# Patient Record
Sex: Female | Born: 1985 | Race: Black or African American | Hispanic: No | Marital: Single | Smoking: Former smoker
Health system: Southern US, Community
[De-identification: ages and names within clinical notes are randomized; demographics above are authoritative.]

## PROBLEM LIST (undated history)

## (undated) DIAGNOSIS — D649 Anemia, unspecified: Secondary | ICD-10-CM

## (undated) DIAGNOSIS — E079 Disorder of thyroid, unspecified: Secondary | ICD-10-CM

## (undated) DIAGNOSIS — O139 Gestational [pregnancy-induced] hypertension without significant proteinuria, unspecified trimester: Secondary | ICD-10-CM

## (undated) DIAGNOSIS — R1110 Vomiting, unspecified: Secondary | ICD-10-CM

## (undated) DIAGNOSIS — E0500 Thyrotoxicosis with diffuse goiter without thyrotoxic crisis or storm: Secondary | ICD-10-CM

## (undated) DIAGNOSIS — F319 Bipolar disorder, unspecified: Secondary | ICD-10-CM

## (undated) HISTORY — DX: Vomiting, unspecified: R11.10

## (undated) HISTORY — DX: Anemia, unspecified: D64.9

## (undated) HISTORY — DX: Gestational (pregnancy-induced) hypertension without significant proteinuria, unspecified trimester: O13.9

---
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
## 2006-01-26 ENCOUNTER — Ambulatory Visit: Payer: Self-pay | Admitting: Family Medicine

## 2006-02-21 ENCOUNTER — Ambulatory Visit: Payer: Self-pay | Admitting: Family Medicine

## 2006-02-22 ENCOUNTER — Ambulatory Visit: Payer: Self-pay | Admitting: Family Medicine

## 2006-03-01 ENCOUNTER — Ambulatory Visit: Payer: Self-pay | Admitting: Family Medicine

## 2006-03-02 ENCOUNTER — Ambulatory Visit: Payer: Self-pay | Admitting: Family Medicine

## 2006-10-08 ENCOUNTER — Emergency Department (HOSPITAL_COMMUNITY): Admission: EM | Admit: 2006-10-08 | Discharge: 2006-10-08 | Payer: Self-pay | Admitting: Emergency Medicine

## 2006-11-12 ENCOUNTER — Emergency Department (HOSPITAL_COMMUNITY): Admission: EM | Admit: 2006-11-12 | Discharge: 2006-11-12 | Payer: Self-pay | Admitting: Emergency Medicine

## 2006-12-10 ENCOUNTER — Emergency Department (HOSPITAL_COMMUNITY): Admission: EM | Admit: 2006-12-10 | Discharge: 2006-12-10 | Payer: Self-pay | Admitting: Emergency Medicine

## 2006-12-16 ENCOUNTER — Inpatient Hospital Stay (HOSPITAL_COMMUNITY): Admission: AD | Admit: 2006-12-16 | Discharge: 2006-12-16 | Payer: Self-pay | Admitting: Gynecology

## 2006-12-22 ENCOUNTER — Ambulatory Visit (HOSPITAL_COMMUNITY): Admission: RE | Admit: 2006-12-22 | Discharge: 2006-12-22 | Payer: Self-pay | Admitting: Obstetrics and Gynecology

## 2006-12-22 ENCOUNTER — Encounter: Payer: Self-pay | Admitting: Obstetrics and Gynecology

## 2007-04-30 ENCOUNTER — Inpatient Hospital Stay (HOSPITAL_COMMUNITY): Admission: AD | Admit: 2007-04-30 | Discharge: 2007-04-30 | Payer: Self-pay | Admitting: Obstetrics and Gynecology

## 2007-09-24 ENCOUNTER — Emergency Department (HOSPITAL_COMMUNITY): Admission: EM | Admit: 2007-09-24 | Discharge: 2007-09-24 | Payer: Self-pay | Admitting: Emergency Medicine

## 2007-10-17 ENCOUNTER — Observation Stay (HOSPITAL_COMMUNITY): Admission: AD | Admit: 2007-10-17 | Discharge: 2007-10-19 | Payer: Self-pay | Admitting: Obstetrics and Gynecology

## 2007-10-23 ENCOUNTER — Inpatient Hospital Stay (HOSPITAL_COMMUNITY): Admission: AD | Admit: 2007-10-23 | Discharge: 2007-10-23 | Payer: Self-pay | Admitting: Obstetrics and Gynecology

## 2007-10-26 ENCOUNTER — Inpatient Hospital Stay (HOSPITAL_COMMUNITY): Admission: AD | Admit: 2007-10-26 | Discharge: 2007-10-26 | Payer: Self-pay | Admitting: Obstetrics and Gynecology

## 2007-11-04 ENCOUNTER — Inpatient Hospital Stay (HOSPITAL_COMMUNITY): Admission: AD | Admit: 2007-11-04 | Discharge: 2007-11-04 | Payer: Self-pay | Admitting: Obstetrics and Gynecology

## 2007-11-06 ENCOUNTER — Inpatient Hospital Stay (HOSPITAL_COMMUNITY): Admission: AD | Admit: 2007-11-06 | Discharge: 2007-11-06 | Payer: Self-pay | Admitting: Obstetrics and Gynecology

## 2007-11-10 ENCOUNTER — Inpatient Hospital Stay (HOSPITAL_COMMUNITY): Admission: AD | Admit: 2007-11-10 | Discharge: 2007-11-13 | Payer: Self-pay | Admitting: Obstetrics and Gynecology

## 2008-04-02 ENCOUNTER — Inpatient Hospital Stay (HOSPITAL_COMMUNITY): Admission: AD | Admit: 2008-04-02 | Discharge: 2008-04-02 | Payer: Self-pay | Admitting: Obstetrics & Gynecology

## 2008-04-19 DIAGNOSIS — O139 Gestational [pregnancy-induced] hypertension without significant proteinuria, unspecified trimester: Secondary | ICD-10-CM

## 2008-04-19 HISTORY — DX: Gestational (pregnancy-induced) hypertension without significant proteinuria, unspecified trimester: O13.9

## 2008-04-19 HISTORY — PX: DILATION AND CURETTAGE OF UTERUS: SHX78

## 2009-02-27 ENCOUNTER — Inpatient Hospital Stay (HOSPITAL_COMMUNITY): Admission: AD | Admit: 2009-02-27 | Discharge: 2009-02-28 | Payer: Self-pay | Admitting: Family Medicine

## 2009-06-22 ENCOUNTER — Inpatient Hospital Stay (HOSPITAL_COMMUNITY): Admission: AD | Admit: 2009-06-22 | Discharge: 2009-06-22 | Payer: Self-pay | Admitting: Obstetrics & Gynecology

## 2009-07-21 ENCOUNTER — Ambulatory Visit (HOSPITAL_COMMUNITY): Admission: RE | Admit: 2009-07-21 | Discharge: 2009-07-21 | Payer: Self-pay | Admitting: Obstetrics & Gynecology

## 2009-08-22 ENCOUNTER — Ambulatory Visit (HOSPITAL_COMMUNITY): Admission: RE | Admit: 2009-08-22 | Discharge: 2009-08-22 | Payer: Self-pay | Admitting: Obstetrics & Gynecology

## 2009-08-28 ENCOUNTER — Ambulatory Visit (HOSPITAL_COMMUNITY): Admission: RE | Admit: 2009-08-28 | Discharge: 2009-08-28 | Payer: Self-pay | Admitting: Obstetrics & Gynecology

## 2009-09-01 ENCOUNTER — Ambulatory Visit (HOSPITAL_COMMUNITY): Admission: RE | Admit: 2009-09-01 | Discharge: 2009-09-01 | Payer: Self-pay | Admitting: Obstetrics & Gynecology

## 2009-09-03 ENCOUNTER — Inpatient Hospital Stay (HOSPITAL_COMMUNITY): Admission: AD | Admit: 2009-09-03 | Discharge: 2009-09-04 | Payer: Self-pay | Admitting: Obstetrics & Gynecology

## 2009-09-03 ENCOUNTER — Ambulatory Visit: Payer: Self-pay | Admitting: Advanced Practice Midwife

## 2009-09-19 ENCOUNTER — Ambulatory Visit (HOSPITAL_COMMUNITY): Admission: RE | Admit: 2009-09-19 | Discharge: 2009-09-19 | Payer: Self-pay | Admitting: Obstetrics & Gynecology

## 2009-09-21 ENCOUNTER — Inpatient Hospital Stay (HOSPITAL_COMMUNITY): Admission: AD | Admit: 2009-09-21 | Discharge: 2009-09-22 | Payer: Self-pay | Admitting: Obstetrics & Gynecology

## 2009-09-22 ENCOUNTER — Inpatient Hospital Stay (HOSPITAL_COMMUNITY): Admission: AD | Admit: 2009-09-22 | Discharge: 2009-09-24 | Payer: Self-pay | Admitting: Obstetrics

## 2009-12-24 ENCOUNTER — Emergency Department (HOSPITAL_COMMUNITY): Admission: EM | Admit: 2009-12-24 | Discharge: 2009-12-24 | Payer: Self-pay | Admitting: Emergency Medicine

## 2010-02-13 ENCOUNTER — Ambulatory Visit (HOSPITAL_COMMUNITY)
Admission: RE | Admit: 2010-02-13 | Discharge: 2010-02-13 | Payer: Self-pay | Source: Home / Self Care | Admitting: Obstetrics & Gynecology

## 2010-04-19 NOTE — L&D Delivery Note (Signed)
Delivery Note At 7:23 PM a viable, healthy female was delivered via Vaginal, Spontaneous Precipitous Delivery (Presentation: ; Occiput Anterior).  APGAR: 9, 9; weight 6 pounds 6 ounces .   Placenta status: delivered, spontaneous and intact .  Cord: 3 vessel  with the following complications: None.    Anesthesia:  Stadol IV  Episiotomy: None Lacerations: None  Est. Blood Loss (mL): 300 mL   Mom to postpartum.  Baby to nursery-stable.  Mat Carne 02/02/2011, 7:39 PM

## 2010-05-10 ENCOUNTER — Encounter: Payer: Self-pay | Admitting: Obstetrics and Gynecology

## 2010-05-10 ENCOUNTER — Encounter: Payer: Self-pay | Admitting: Obstetrics & Gynecology

## 2010-07-01 LAB — PREGNANCY, URINE: Preg Test, Ur: NEGATIVE

## 2010-07-01 LAB — CBC
MCH: 30.8 pg (ref 26.0–34.0)
MCV: 90.4 fL (ref 78.0–100.0)
Platelets: 351 10*3/uL (ref 150–400)
RDW: 14.2 % (ref 11.5–15.5)
WBC: 6.9 10*3/uL (ref 4.0–10.5)

## 2010-07-06 LAB — URINALYSIS, ROUTINE W REFLEX MICROSCOPIC
Bilirubin Urine: NEGATIVE
Ketones, ur: 15 mg/dL — AB
Protein, ur: NEGATIVE mg/dL
Urobilinogen, UA: 0.2 mg/dL (ref 0.0–1.0)

## 2010-07-06 LAB — CBC
Hemoglobin: 9.9 g/dL — ABNORMAL LOW (ref 12.0–15.0)
MCHC: 34.9 g/dL (ref 30.0–36.0)
MCV: 93.6 fL (ref 78.0–100.0)
RBC: 3.03 MIL/uL — ABNORMAL LOW (ref 3.87–5.11)
RDW: 12.8 % (ref 11.5–15.5)

## 2010-07-06 LAB — URINE MICROSCOPIC-ADD ON

## 2010-07-06 LAB — WET PREP, GENITAL: Trich, Wet Prep: NONE SEEN

## 2010-07-06 LAB — RPR: RPR Ser Ql: NONREACTIVE

## 2010-07-06 LAB — URINE CULTURE: Colony Count: 9000

## 2010-07-06 LAB — GC/CHLAMYDIA PROBE AMP, URINE: Chlamydia, Swab/Urine, PCR: NEGATIVE

## 2010-07-22 LAB — URINALYSIS, ROUTINE W REFLEX MICROSCOPIC
Ketones, ur: >80 mg/dL — AB
Nitrite: NEGATIVE
Protein, ur: NEGATIVE mg/dL

## 2010-07-22 LAB — CBC
MCHC: 33.7 g/dL (ref 30.0–36.0)
Platelets: 357 10*3/uL (ref 150–400)
RDW: 12.7 % (ref 11.5–15.5)

## 2010-07-22 LAB — POCT PREGNANCY, URINE: Preg Test, Ur: POSITIVE

## 2010-07-22 LAB — WET PREP, GENITAL

## 2010-08-14 ENCOUNTER — Inpatient Hospital Stay (HOSPITAL_COMMUNITY)
Admission: AD | Admit: 2010-08-14 | Discharge: 2010-08-14 | Disposition: A | Discharge status: Home / Self Care | Payer: Medicaid Other | Source: Ambulatory Visit | Attending: Family Medicine | Admitting: Family Medicine

## 2010-08-14 DIAGNOSIS — O210 Mild hyperemesis gravidarum: Secondary | ICD-10-CM | POA: Insufficient documentation

## 2010-08-14 LAB — WET PREP, GENITAL
Clue Cells Wet Prep HPF POC: NONE SEEN
Yeast Wet Prep HPF POC: NONE SEEN

## 2010-08-14 LAB — URINALYSIS, ROUTINE W REFLEX MICROSCOPIC
Glucose, UA: NEGATIVE mg/dL
Nitrite: NEGATIVE
Protein, ur: NEGATIVE mg/dL
pH: 7.5 (ref 5.0–8.0)

## 2010-08-19 ENCOUNTER — Inpatient Hospital Stay (HOSPITAL_COMMUNITY)
Admission: AD | Admit: 2010-08-19 | Discharge: 2010-08-19 | Disposition: A | Discharge status: Home / Self Care | Payer: Medicaid Other | Source: Ambulatory Visit | Attending: Obstetrics and Gynecology | Admitting: Obstetrics and Gynecology

## 2010-08-19 ENCOUNTER — Inpatient Hospital Stay (HOSPITAL_COMMUNITY): Payer: Medicaid Other

## 2010-08-19 DIAGNOSIS — O209 Hemorrhage in early pregnancy, unspecified: Secondary | ICD-10-CM | POA: Insufficient documentation

## 2010-08-19 LAB — URINE MICROSCOPIC-ADD ON

## 2010-08-19 LAB — URINALYSIS, ROUTINE W REFLEX MICROSCOPIC
Glucose, UA: NEGATIVE mg/dL
Leukocytes, UA: NEGATIVE
Protein, ur: NEGATIVE mg/dL
pH: 6.5 (ref 5.0–8.0)

## 2010-08-19 LAB — WET PREP, GENITAL: Clue Cells Wet Prep HPF POC: NONE SEEN

## 2010-09-01 NOTE — H&P (Signed)
[name], [name] NO.:  [id number]   MEDICAL RECORD NO.:  [id number]          PATIENT TYPE:  OBV   LOCATION:  9121                          [facility]   PHYSICIAN:  Osborn Coho, M.D.   DATE OF BIRTH:  1985/12/06   DATE OF ADMISSION:  10/17/2007  DATE OF DISCHARGE:                              HISTORY & PHYSICAL   This is a 25 year old gravida 3, para 1-0-1-1 at 34-0/7 weeks who  presents from the office with hypertension and swelling for 4 days.  She  denies headache or visual changes.  The pregnancy has been followed by  the Nurse Midwife Service remarkable for,  1. History of rapid labor.  2. Seasonal asthma.   ALLERGIES:  SEAFOOD.   OB HISTORY:  Remarkable for vaginal delivery in 2004 of a female infant  at 60 weeks' gestation weighing 6 pounds 2 ounces, remarkable for 3-hour  labor.  She had a missed AB and D and C in 2008.   MEDICAL HISTORY:  Remarkable for anemia with first baby, childhood  varicella, seasonal asthma.   SURGICAL HISTORY:  Negative except for D and C.   FAMILY HISTORY:  Remarkable for grandparents and mother and father with  hypertension, grandmother and cousin with diabetes, grandmother with  stroke, cousin with lupus, grandmother with cervical cancer, mother with  schizophrenia.   GENETIC HISTORY:  Remarkable for father of the baby's uncle with heart  defects and father of the baby's cousin with spina bifida.   SOCIAL HISTORY:  The patient is single.  Father of baby [name]  is involved and supportive.  She does not report a religious  affiliation.  She denies any alcohol, tobacco or drug use.   PRENATAL LABS:  Hemoglobin 12.8, platelets 337, blood type O+, antibody  screen negative, sickle cell negative, RPR nonreactive, rubella immune,  hepatitis negative, HIV negative, gonorrhea negative, chlamydia  negative, cystic fibrosis negative.   HISTORY OF CURRENT PREGNANCY:  The patient entered care at 85 weeks'  gestation.  She had an ultrasound at 9 weeks and a first trimester  screen at 12 weeks that was normal, 18-week ultrasound was normal, and  glucola was done at 28 weeks and was normal.  She was noted to have some  thyromegaly at 28 weeks, but TSH was normal, and blood pressures  throughout pregnancy has been 100-130 over 60-80.   OBJECTIVE:  VITAL SIGNS: Stable.  Blood pressures 126-135 over 89-97  with most diastolics in the low 90s.  HEENT:  Within normal limits.  THYROID:  Normal, not enlarged by my exam.  CHEST:  Clear to auscultation.  Heart:  Regular rate and rhythm.  ABDOMEN:  Gravid at 34 cm, vertex Leopold's, EFM shows reactive fetal  heart rate with occasional contractions.  PELVIC:  Deferred.  EXTREMITIES:  Showed 3+ DTRs with no clonus and 1+ edema of the feet and  legs.   Cath UA shows negative protein.  CBC shows hemoglobin 12.1, platelets  280, sodium 137, potassium 3.6, creatinine 0.43, AST 13, ALT 10, uric  acid 3.9.   ASSESSMENT:  1. Intrauterine  pregnancy at 34-0/7 weeks.  2. Pregnancy-induced hypertension.   PLAN:  1. Admit to hospital for outpatient observation.  2. A 24-hour urine for protein and creatinine.  3. Bed rest with bathroom privileges.  4. Further orders to follow.      Marie L. Williams, C.N.M.      Osborn Coho, M.D.  Electronically Signed    MLW/MEDQ  D:  10/17/2007  T:  10/18/2007  Job:  [id number]

## 2010-09-01 NOTE — H&P (Signed)
NAMECRYSTALE, [name]              ACCOUNT NO.:  [id number]   MEDICAL RECORD NO.:  [id number]         PATIENT TYPE:  WINP   LOCATION:                                [facility]   PHYSICIAN:  Hal Morales, M.D.DATE OF BIRTH:  [birth year]   DATE OF ADMISSION:  11/10/2007  DATE OF DISCHARGE:                              HISTORY & PHYSICAL   This is a 25 year old, gravida 3, para 1-0-1-1, at 74 and 3/7 weeks who  presents for [facility] evaluation.  She had a nonreactive NST in the office  and blood pressures were elevated in the 150s/100-110 range.  She has  had some headaches off and on over the last few weeks.  She first  developed hypertension at 34 weeks and was placed on labetalol and  dosages have been increased over time to 500 mg t.i.d.  She has not  taken any medications in the past 3 days secondary to reported vomiting.  Pregnancy has been followed initially by the Midwife Service and by the  MD service and remarkable for:  1. History of rapid labor.  2. Asthma.  3. Group B Strep positive.   ALLERGIES:  SEAFOOD.   OB history is remarkable for vaginal delivery in 2004 of a female infant  at [redacted] weeks gestation weighing 6 pounds 2 ounces with no complications.  She had a missed AB in 2008 with a D&C.   Medical history is remarkable for anemia with her first baby, history of  childhood varicella, and history of seasonal asthma.   Surgical history is remarkable for a D&C in 2008.   Family history is remarkable for grandparents and parents and aunts and  uncles with hypertension, grandmother with diabetes and a cousin with  diabetes, grandmother with stroke, cousin with lupus, grandmother with  cervical cancer, and mother with schizophrenia.   Genetic history is remarkable for father of the baby's uncle with heart  defects, and father of baby's cousin with spina bifida.   SOCIAL HISTORY:  The patient is single.  Father of the baby, Hosie Spangle is involved and  supportive.  She does not report a religious  affiliation.  She denies any alcohol, tobacco, or drug use.   PRENATAL LABS:  Hemoglobin 12.8 and platelets 337,000.  Blood type O+.  Antibody screen negative.  Sickle cell negative.  RPR nonreactive.  Rubella immune.  Hepatitis negative.  HIV negative.  Gonorrhea negative.  Chlamydia negative.  Cystic fibrosis negative.  History of current  pregnancy.  The patient entered care at 14 weeks' gestation.  She had a  first trimester screen that was normal.  Ultrasound at 18 weeks was also  normal.  Glucola at 28 weeks was normal.  She was noted to have some  diffuse thyromegaly and labs were done and TSH was found to be normal,  and she developed some hypertension at 34 weeks in the 130/100 range.  She was placed on labetalol 100 mg twice a day.  Her labetalol was  increased at 35 weeks and then again at 36 weeks to 300 mg twice 3 times  a  day and she reports that she was increased to 500 mg just recently.   OBJECTIVE DATA:  VITAL SIGNS:  Stable.  Blood pressures initially 145-  163 over 96-111.  She was given labetalol 200 mg and then her blood  pressures were 129/83 and 150/101.  HEENT:  Within normal limits.  Thyroid normal, slightly palpable.  CHEST:  Clear to auscultation.  HEART:  Regular rate and rhythm.  ABDOMEN:  Gravid, 37 cm, vertex Leopold's exam shows reassuring fetal  heart rate with 10-15 beat accelerations and occasional contractions.  Cervix is loose 2 cm, 85% effaced, -2 stations.  EXTREMITIES:  Show to the 3+ DTRs with no clonus.  Trace edema.   Ultrasound shows a BPP score of 8/8 and AFI not recorded.  Largest  pocket was reported 3.5 cm and growth measurements were not done.   PIH LABS:  White blood cell count 5.9, hemoglobin 12.0, and platelets  301,000.  Metabolic chemistries are within normal limits including AST  of 17, ALT of 13, and uric acid 4.1.  Urinalysis clean catch was 30 mg  of protein.   ASSESSMENT:  1.  Intrauterine pregnancy at 37 and 3/7 weeks.  2. Pregnancy-induced hypertension, rule out pre-eclampsia.   PLAN:  1. Admit to Antenatal/Birthing Suites per Dr. Pennie Rushing.  2. We will complete 24-hour urine for protein studies.  3. Repeat PIH labs in 24 hours.  4. Labetalol 400 mg every 6 hours and further orders to follow.      [name], C.N.M.      ______________________________  Hal Morales, M.D.    MLW/MEDQ  D:  11/10/2007  T:  11/11/2007  Job:  [id number]

## 2010-09-01 NOTE — H&P (Signed)
NAMELYNDSEY, DEMOS              ACCOUNT NO.:  [id number]   MEDICAL RECORD NO.:  [id number]          PATIENT TYPE:  AMB   LOCATION:  SDC                           [facility]   PHYSICIAN:  Tilda Burrow, M.D. DATE OF BIRTH:  1985-04-30   DATE OF ADMISSION:  12/21/2006  DATE OF DISCHARGE:                              HISTORY & PHYSICAL   ADMISSION DIAGNOSIS:  Missed abortion.   HISTORY OF PRESENT ILLNESS:  This 25 year old female gravida 2, para 1,  AB 0 with one living child.  LMP September 20, 2006, placing her at 12 weeks  and 1 day when seen for initial prenatal visit on December 14, 2006.  Is  admitted at [facility] for suction D&C.  The patient has been  seen in our office.  Had ultrasound confirmation of fetal demise.  Had a  crown rump length of 1.91 cm with absence of fetal heart motion.  She  has been seen in the emergency room once for spotting.  She understands  the inevitability of pregnancy loss and desires to proceed with suction  D&C.  The procedure has been reviewed by Dr. Despina Hidden.  The patient  understands the procedure of dilatation and curettage.  She desires to  proceed at this time with suction D&C, which will be performed at [facility], as per patient preference on December 21, 2006 at 9:00 a.m.   PAST MEDICAL HISTORY:  Benign.   PAST SURGICAL HISTORY:  Negative.   ALLERGIES:  None.   MEDICATIONS:  Inhaler p.r.n.   SOCIAL HISTORY:  Single.  Lives with the baby's father and his mom.  She  is a Futures trader.  She is a nonsmoker and nondrinker and denies  recreational drugs.   OB HISTORY:  Notable for a three hour labor with her first baby  delivered at [facility] under [facility].   PHYSICAL EXAMINATION:  Height 5 feet 5.  Weight 186.  Blood pressure  120/80.  Pupils are equal, round and reactive.  Extraocular movements are intact.  NECK:  Supple.  CHEST:  Clear to auscultation.  ABDOMEN:  Nontender.  PELVIC:  External genitalia  normal.  Uterus anteflexed with a  gestational sac consistent with 8 weeks, 3 days, even though she is 11  weeks, and with absence of fetal heart motion.   IMPRESSION:  Missed abortion.   PLAN:  Suction D&C on December 21, 2006.      Tilda Burrow, M.D.  Electronically Signed     JVF/MEDQ  D:  12/21/2006  T:  12/21/2006  Job:  [id number]

## 2010-09-01 NOTE — Op Note (Signed)
[name], [name]              ACCOUNT NO.:  [id number]   MEDICAL RECORD NO.:  [id number]          PATIENT TYPE:  AMB   LOCATION:  SDC                           [facility]   PHYSICIAN:  Tilda Burrow, M.D. DATE OF BIRTH:  08-Sep-1985   DATE OF PROCEDURE:  12/22/2006  DATE OF DISCHARGE:                               OPERATIVE REPORT   PREOPERATIVE DIAGNOSES:  Missed abortion, 11 weeks, 1 day.   POSTOPERATIVE DIAGNOSIS:  Missed abortion, 12 weeks, 1 day.   PROCEDURE:  Suction dilation and curettage.   SURGEON:  Tilda Burrow, M.D.   ASSISTANT:  None.   ANESTHESIA:  MAC with IV sedation.   COMPLICATIONS:  None.   FINDINGS:  Uterine sounding to 11 cm preprocedure.  Fluid, tissue, and  blood products of conception consistent with missed abortion.   BLOOD TYPE:  O positive.   DETAILS OF PROCEDURE:  The patient was taken to the operating room.  Anesthesia was introduced.  The vaginal area was prepped and draped.  The cervix was grasped with a single-tooth tenaculum after anterior lip  anesthetized.  Paracervical block was applied using 1% Marcaine.  The  uterus was sounded in the anteflexed position to 11 cm, dilated to a 29  [place] allowing introduction of a curved 9 mm suction curet which was  then used in a circumferential fashion to evacuate fluid, tissue and  blood consistent with missed AB.  The patient had smooth, sharp  curettage in four quadrants confirming uterine evacuation.  The patient  then went to the recovery room in stable condition.  Estimated blood  loss 50 cc.  Blood type once again confirmed as O positive.   The patient will be followed up in two weeks in our office.  Toradol  will be administered in the recovery room.      Tilda Burrow, M.D.  Electronically Signed     JVF/MEDQ  D:  12/22/2006  T:  12/22/2006  Job:  [id number]

## 2010-09-01 NOTE — Discharge Summary (Signed)
NAMERANAY, KETTER              ACCOUNT NO.:  [id number]   MEDICAL RECORD NO.:  [id number]          PATIENT TYPE:  INP   LOCATION:  9155                          [facility]   PHYSICIAN:  Janine Limbo, M.D.DATE OF BIRTH:  Aug 29, 1985   DATE OF ADMISSION:  10/17/2007  DATE OF DISCHARGE:  10/19/2007                               DISCHARGE SUMMARY   ADMITTING DIAGNOSES:  1. Intrauterine pregnancy at 34 weeks.  2. Pregnancy-induced hypertension with questionable proteinuria.  3. Preterm labor.   DISCHARGE DIAGNOSES:  1. Intrauterine pregnancy at 34-2/7th weeks.  2. Preterm labor, stable.  3. Pregnancy-induced hypertension, stable on labetalol.   PROCEDURES:  None.   HOSPITAL COURSE:  Ms. Altieri is a 25 year old gravida 3, para 1-0-1-1  at 16 weeks who presented from the office on the afternoon of October 17, 2007, with hypertension and swelling x4 days.  She denied any headache  or visual symptoms.  Her pregnancy had been followed by the Nurse  Midwife Service and had been remarkable for  1. History of rapid labor.  2. Seasonal asthma.   On admission, blood pressures were in the 120s-130s over high 80s to  high 90s.  DTR showed 3+ reflexes without clonus.  There is 1+ edema.  Urinalysis showed 30 mg of protein, but a cath UA showed negative  protein.  PIH labs were within normal limits.  The patient was admitted  then for observation and to begin a 24-hour urine.  Through the next 24  hours, blood pressures were somewhat labile and NSTs, which she had  showed a reactive tracing.  She was seen in the afternoon of October 18, 2007, with 1 episode of vomiting.  She complained of contractions.  Her  cervix was 1-2, 80% vertex, at -1 station.  Blood pressures were in the  120-148/77-101 diastolic.  Total proteinuria in 24-hour here is 171.  She was kept overnight and IV fluid hydration was started and one dose  of terbutaline was given for q. 2-minute contractions.  She was  placed  on labetalol 100 mg p.o. b.i.d. and continuous electronic fetal  monitoring was implemented.  By the morning of October 19, 2007, the patient  was feeling much less crampy.  She was somewhat concerned about her  preterm labor status and elevated blood pressure status as she was going  to be living in [place].  She denied headache, visual symptoms, or  epigastric pain.  Her blood pressures were 140/70.  Physical exam was  within normal limits.  Fetal heart rate was reactive in segments, but 2-  4 contractions per hour.  CBC was within normal limits as well as  comprehensive metabolic panel.  The patient was maintained on labetalol  100 mg p.o. b.i.d.  Her blood pressure was noted to respond very well to  labetalol.  Dr. Stefano Gaul saw the patient.  In light of her stability,  she was deemed to receive full benefit of hospital stay and was  discharged home.   DISCHARGE INSTRUCTIONS:  The patient is to maintain increased rest.  PIH  symptoms were also  reviewed with the patient.  She is to be on pelvic  rest.   DISCHARGE MEDICATIONS:  1. Labetalol 100 mg p.o. b.i.d.  2. Prenatal vitamin one p.o. daily.   FOLLOWUP:  Discharge followup will occur on Monday, October 23, 2007, at  [facility] as previously noted or p.r.n.      Renaldo Reel Emilee Hero, C.N.M.      Janine Limbo, M.D.  Electronically Signed    VLL/MEDQ  D:  10/19/2007  T:  10/20/2007  Job:  [id number]

## 2010-09-04 NOTE — H&P (Signed)
NAME:  MASSA, PE                        ACCOUNT NO.:  [id number]   MEDICAL RECORD NO.:  [id number]                   PATIENT TYPE:  INP   LOCATION:  9134                                 [facility]   PHYSICIAN:  Naima A. Dillard, M.D.              DATE OF BIRTH:  [birth year]   DATE OF ADMISSION:  12/17/2002  DATE OF DISCHARGE:                                HISTORY & PHYSICAL   HISTORY OF PRESENT ILLNESS:  [name] is a 25 year old gravida 1, para 0  at 42 and 2/7 weeks who presents following spontaneous rupture of membranes  at home at 0230 this a.m. for clear fluid.  Her contractions began  increasing in frequency and intensity with spontaneous rupture of membranes.  She reports positive fetal movement, no bleeding, no PIH symptoms.  Her  pregnancy has been followed by the C.N.M. service at [facility] and is remarkable  for late to care, conception on patch, adolescent, asthma, group B Strep  negative.  This patient was initially evaluated at the office of [facility] on Sep 12, 2002 at [redacted] weeks gestation.  EDC determined by dates and confirmed with  pregnancy ultrasonography.  Her pregnancy has been essentially unremarkable.  She has been size less than dates throughout.  Adequate growth has been  confirmed with ultrasound at the 34th percentile at 34 weeks.  She has been  normotensive with no proteinuria.   LABORATORIES:  Sep 12, 2002:  Hemoglobin and hematocrit 10.9 and 30.5,  platelets 363,000.  Blood type and Rh O+.  Antibody screen negative.  VDRL  nonreactive.  Rubella immune.  Hepatitis B surface antigen negative.  HIV  nonreactive.  Pap smear within normal limits.  CF testing declined.  At 28  weeks one hour glucose challenge 85 and hemoglobin 11.0.  At 36 weeks  culture of the vaginal tract is negative for group B Strep and GC and  Chlamydia.   PAST MEDICAL HISTORY:  The patient has asthma.   FAMILY HISTORY:  The patient's mother, father, and maternal grandmother  with  a history of chronic hypertension.  Father with emphysema.  Paternal  grandmother and cousin and paternal grandfather with diabetes.  Paternal  grandmother stroke.  The patient's cousin with lupus and mother with  rheumatoid arthritis.  A paternal aunt and uncle and cousin are deceased  from cancer of an unknown type.   GENETIC HISTORY:  There is no familial or chromosomal disorders, children  that died in infancy or that were born with birth defects.   SOCIAL HISTORY:  Ms. Seel is a 25 year old African-American single  female.  Father of the baby, Hosie Spangle, is involved and supportive.  The  patient's mother and family are also supportive and with her in labor.  They  are [facility] in their faith.  She denies the use of tobacco, alcohol, or  illicit drugs.   ALLERGIES:  No known drug  allergies.   REVIEW OF SYSTEMS:  As described above.  The patient is typical of one with  a uterine pregnancy at term with spontaneous rupture of membranes in active  labor.   PHYSICAL EXAMINATION:  VITAL SIGNS:  Stable, afebrile.  HEENT:  Unremarkable.  HEART:  Regular rate and rhythm.  LUNGS:  Clear.  ABDOMEN:  Gravid in its contour.  Uterine fundus is noted to extend 38 cm  above the level of the pubic symphysis.  Leopold's maneuver finds the infant  to be in a longitudinal lie, cephalic presentation and the estimated fetal  weight is 6.5 pounds.  The baseline of the fetal heart rate monitor is 140s  with average long-term variability, positive decelerations, although not  meeting criteria for reactivity.  There are occasional mild variable  decelerations noted.  The patient is contracting every two to three minutes.  PELVIC:  Her cervix is noted to be 7 cm dilated, completely effaced with the  cephalic presenting part at a -1 station.  Membranes are ruptured and clear.  EXTREMITIES:  No pathologic edema.  DTRs are 1+ with no clonus.   ASSESSMENT:  1. Intrauterine pregnancy at term.   2. Active labor.   PLAN:  Admit per Naima A. Normand Sloop, M.D.  Routine C.N.M. orders.  Pain  medicine has been offered for the patient and her request at this time is to  continue with expectant management.  She is doing quite well and spontaneous  vaginal delivery is anticipated.     Rica Koyanagi, C.N.M.               Naima A. Normand Sloop, M.D.    SDM/MEDQ  D:  12/17/2002  T:  12/17/2002  Job:  [id number]

## 2010-09-29 ENCOUNTER — Other Ambulatory Visit: Payer: Self-pay

## 2010-09-29 DIAGNOSIS — O0930 Supervision of pregnancy with insufficient antenatal care, unspecified trimester: Secondary | ICD-10-CM

## 2010-10-01 ENCOUNTER — Other Ambulatory Visit: Payer: Self-pay | Admitting: Obstetrics & Gynecology

## 2010-10-01 DIAGNOSIS — J45909 Unspecified asthma, uncomplicated: Secondary | ICD-10-CM

## 2010-10-01 DIAGNOSIS — O0930 Supervision of pregnancy with insufficient antenatal care, unspecified trimester: Secondary | ICD-10-CM

## 2010-10-01 LAB — POCT URINALYSIS DIP (DEVICE)
Hgb urine dipstick: NEGATIVE
Ketones, ur: NEGATIVE mg/dL
Protein, ur: 30 mg/dL — AB
Specific Gravity, Urine: >=1.03 (ref 1.005–1.030)
pH: 6 (ref 5.0–8.0)

## 2010-10-01 LAB — RUBELLA ANTIBODY, IGM: Rubella: IMMUNE

## 2010-10-29 ENCOUNTER — Other Ambulatory Visit: Payer: Self-pay | Admitting: Obstetrics and Gynecology

## 2010-10-29 DIAGNOSIS — J45909 Unspecified asthma, uncomplicated: Secondary | ICD-10-CM

## 2010-10-29 DIAGNOSIS — O0930 Supervision of pregnancy with insufficient antenatal care, unspecified trimester: Secondary | ICD-10-CM

## 2010-10-29 DIAGNOSIS — Z3689 Encounter for other specified antenatal screening: Secondary | ICD-10-CM

## 2010-10-29 LAB — POCT URINALYSIS DIP (DEVICE)
Hgb urine dipstick: NEGATIVE
Ketones, ur: NEGATIVE mg/dL
Protein, ur: 30 mg/dL — AB
Specific Gravity, Urine: 1.02 (ref 1.005–1.030)
Urobilinogen, UA: 1 mg/dL (ref 0.0–1.0)
pH: 7 (ref 5.0–8.0)

## 2010-11-02 ENCOUNTER — Ambulatory Visit (HOSPITAL_COMMUNITY)
Admission: RE | Admit: 2010-11-02 | Discharge: 2010-11-02 | Disposition: A | Discharge status: Home / Self Care | Payer: Medicaid Other | Source: Ambulatory Visit | Attending: Obstetrics and Gynecology | Admitting: Obstetrics and Gynecology

## 2010-11-02 DIAGNOSIS — Z363 Encounter for antenatal screening for malformations: Secondary | ICD-10-CM | POA: Insufficient documentation

## 2010-11-02 DIAGNOSIS — Z8751 Personal history of pre-term labor: Secondary | ICD-10-CM | POA: Insufficient documentation

## 2010-11-02 DIAGNOSIS — O358XX0 Maternal care for other (suspected) fetal abnormality and damage, not applicable or unspecified: Secondary | ICD-10-CM | POA: Insufficient documentation

## 2010-11-02 DIAGNOSIS — Z3689 Encounter for other specified antenatal screening: Secondary | ICD-10-CM

## 2010-11-02 DIAGNOSIS — Z1389 Encounter for screening for other disorder: Secondary | ICD-10-CM | POA: Insufficient documentation

## 2010-11-19 ENCOUNTER — Other Ambulatory Visit: Payer: Self-pay | Admitting: Family Medicine

## 2010-11-19 ENCOUNTER — Other Ambulatory Visit: Payer: Self-pay | Admitting: Obstetrics and Gynecology

## 2010-11-19 DIAGNOSIS — J45909 Unspecified asthma, uncomplicated: Secondary | ICD-10-CM

## 2010-11-19 DIAGNOSIS — O0930 Supervision of pregnancy with insufficient antenatal care, unspecified trimester: Secondary | ICD-10-CM

## 2010-11-19 LAB — POCT URINALYSIS DIP (DEVICE)
Bilirubin Urine: NEGATIVE
Leukocytes, UA: NEGATIVE
Protein, ur: 30 mg/dL — AB
Specific Gravity, Urine: 1.025 (ref 1.005–1.030)
pH: 6.5 (ref 5.0–8.0)

## 2010-11-19 LAB — CBC
HCT: 33.1 % — ABNORMAL LOW (ref 36.0–46.0)
Hemoglobin: 10.9 g/dL — ABNORMAL LOW (ref 12.0–15.0)
MCHC: 32.9 g/dL (ref 30.0–36.0)
MCV: 86.4 fL (ref 78.0–100.0)
RDW: 12.3 % (ref 11.5–15.5)

## 2010-11-19 LAB — RPR

## 2010-11-26 ENCOUNTER — Other Ambulatory Visit: Payer: Medicaid Other

## 2010-12-16 DIAGNOSIS — O0930 Supervision of pregnancy with insufficient antenatal care, unspecified trimester: Secondary | ICD-10-CM | POA: Insufficient documentation

## 2010-12-16 DIAGNOSIS — J45909 Unspecified asthma, uncomplicated: Secondary | ICD-10-CM | POA: Insufficient documentation

## 2010-12-16 DIAGNOSIS — D649 Anemia, unspecified: Secondary | ICD-10-CM | POA: Insufficient documentation

## 2010-12-16 DIAGNOSIS — O09899 Supervision of other high risk pregnancies, unspecified trimester: Secondary | ICD-10-CM | POA: Insufficient documentation

## 2010-12-17 ENCOUNTER — Other Ambulatory Visit: Payer: Self-pay | Admitting: Family Medicine

## 2010-12-17 ENCOUNTER — Ambulatory Visit (INDEPENDENT_AMBULATORY_CARE_PROVIDER_SITE_OTHER): Payer: Medicaid Other | Admitting: Family Medicine

## 2010-12-17 VITALS — BP 121/78 | Temp 97.6°F | Wt 183.3 lb

## 2010-12-17 DIAGNOSIS — O0930 Supervision of pregnancy with insufficient antenatal care, unspecified trimester: Secondary | ICD-10-CM

## 2010-12-17 LAB — POCT URINALYSIS DIP (DEVICE)
Glucose, UA: NEGATIVE mg/dL
Hgb urine dipstick: NEGATIVE
Ketones, ur: NEGATIVE mg/dL
Protein, ur: 30 mg/dL — AB
Specific Gravity, Urine: 1.025 (ref 1.005–1.030)
Urobilinogen, UA: 0.2 mg/dL (ref 0.0–1.0)

## 2010-12-17 NOTE — Progress Notes (Signed)
Pulse 101 clear vaginal discharge. Swelling in feet and ankles.

## 2010-12-17 NOTE — Patient Instructions (Signed)
  Pregnancy - Third Trimester The third trimester begins at the 28th week of pregnancy and ends at birth. It is important to follow your doctor's instructions. HOME CARE  Keep your doctor's appointments.   Do not smoke.   Do not drink alcohol or use drugs.   Only take medicine the doctor tells you to take.   Take prenatal vitamins as directed. The vitamin should contain 1 milligram of folic acid.   Exercise.   Eat healthy foods. Eat regular, well-balanced meals.   You can have sex (intercourse) if there are no other problems with the pregnancy.   Do not use hot tubs, steam rooms, or saunas.   Wear a seat belt while driving.   Avoid raw meat, uncooked cheese, and litter boxes and soil used by cats.   Rest with your legs raised (elevated).   Make a list of emergency phone numbers. Keep this list with you.   Arrange for help when you come back home after delivering the baby.   Make a trial run to the hospital.   Take prenatal classes.   Prepare the baby's nursery.   Do not travel out of the city. If you absolutely have to, get permission from your doctor first.   Wear flat shoes. Do not wear high heels.  GET HELP IF:  You have any concerns or worries during your pregnancy.  GET HELP RIGHT AWAY IF:  You have a temperature by mouth above 101, not controlled by medicine.   You have not felt the baby move for more than 1 hour. If you think the baby is not moving as much as normal, eat something with sugar in it or lie down on your left side for an hour. The baby should move at least 4 to 5 times per hour.   Fluid is coming from the vagina.   Blood is coming from the vagina. Light spotting is common, especially after sex (intercourse).   You have belly (abdominal) pain.   You have a bad smelling fluid (discharge) coming from the vagina. The fluid changes from clear to white.   You still feel sick to your stomach (nauseous).   You throw up (vomit) more than 24  hours.   You have the chills.   You have shortness of breath.   You have a burning feeling when you pee (urinate).   You loose or gain more than 2 pounds (0.9 kilograms) of weight over a weeks time, or as suggested by your doctor.   Your face, hands, feet, or legs get puffy (swell).   You have a bad headache that will not go away.   You start to have problems seeing (blurry or double vision).   You fall, are in a car accident, or have any kind of trauma.   There is mental or physical violence at home.  MAKE SURE YOU:   Understand these instructions.   Will watch your condition.   Will get help right away if you are not doing well or get worse.  Document Released: 06/30/2009  [employer] Patient Information 2011 [employer]. 

## 2010-12-17 NOTE — Progress Notes (Signed)
  Subjective:    [name] is a 25 y.o. female being seen today for her obstetrical visit. She is at [redacted]w[redacted]d gestation. Patient reports no complaints. Fetal movement: normal. Denies contractions, bleeding, leakage of fluid. Desires to br/bo with BTL for pp contraception.  Menstrual History: OB History    Grav Para Term Preterm Abortions TAB SAB Ect Mult Living   5 3 2 1 1  1   3        Review of Systems Pertinent items are noted in HPI.   Objective:    BP 121/78  Temp 97.6 F (36.4 C)  Wt 183 lb 4.8 oz (83.144 kg)  LMP 05/08/2010 FHT:  127 BPM  Uterine Size: 32 cm  Presentation: unsure     Assessment:    Pregnancy 31 and 6/7 weeks   Plan:    28-week labs reviewed, normal, but pt did not complete 1 hour glucose.   Infant feeding: plans to breastfeed, plans to bottle feed. Follow up in 1 Week.   Sign BTL papers today.

## 2010-12-18 LAB — GLUCOSE TOLERANCE, 1 HOUR: Glucose, 1 Hour GTT: 85 mg/dL (ref 70–140)

## 2010-12-24 ENCOUNTER — Encounter (HOSPITAL_COMMUNITY): Payer: Self-pay | Admitting: Family Medicine

## 2010-12-24 ENCOUNTER — Ambulatory Visit (INDEPENDENT_AMBULATORY_CARE_PROVIDER_SITE_OTHER): Payer: Medicaid Other | Admitting: Obstetrics and Gynecology

## 2010-12-24 ENCOUNTER — Inpatient Hospital Stay (HOSPITAL_COMMUNITY): Payer: Medicaid Other

## 2010-12-24 ENCOUNTER — Encounter: Payer: Self-pay | Admitting: Obstetrics and Gynecology

## 2010-12-24 ENCOUNTER — Inpatient Hospital Stay (HOSPITAL_COMMUNITY)
Admission: AD | Admit: 2010-12-24 | Discharge: 2010-12-24 | Disposition: A | Discharge status: Home / Self Care | Payer: Medicaid Other | Source: Ambulatory Visit | Attending: Obstetrics & Gynecology | Admitting: Obstetrics & Gynecology

## 2010-12-24 ENCOUNTER — Other Ambulatory Visit: Payer: Self-pay | Admitting: Obstetrics and Gynecology

## 2010-12-24 VITALS — BP 126/79 | Temp 97.5°F | Wt 182.8 lb

## 2010-12-24 DIAGNOSIS — O99019 Anemia complicating pregnancy, unspecified trimester: Secondary | ICD-10-CM

## 2010-12-24 DIAGNOSIS — D649 Anemia, unspecified: Secondary | ICD-10-CM | POA: Insufficient documentation

## 2010-12-24 DIAGNOSIS — O368190 Decreased fetal movements, unspecified trimester, not applicable or unspecified: Secondary | ICD-10-CM

## 2010-12-24 DIAGNOSIS — O09219 Supervision of pregnancy with history of pre-term labor, unspecified trimester: Secondary | ICD-10-CM

## 2010-12-24 DIAGNOSIS — O99891 Other specified diseases and conditions complicating pregnancy: Secondary | ICD-10-CM | POA: Insufficient documentation

## 2010-12-24 DIAGNOSIS — J45909 Unspecified asthma, uncomplicated: Secondary | ICD-10-CM | POA: Insufficient documentation

## 2010-12-24 LAB — POCT URINALYSIS DIP (DEVICE)
Glucose, UA: NEGATIVE mg/dL
Hgb urine dipstick: NEGATIVE
Specific Gravity, Urine: 1.025 (ref 1.005–1.030)

## 2010-12-24 MED ORDER — OMEPRAZOLE MAGNESIUM 20 MG PO TBEC: 20.0000 mg | DELAYED_RELEASE_TABLET | Freq: Every day | ORAL | Status: DC

## 2010-12-24 NOTE — ED Provider Notes (Signed)
S: [name] is here for evaluaton of decreased fetal movement. She was sent upstairs from the women[facility] clinic where she was seen for routine prenatal visit. She reports decreased fetal movement for the past 2 days. She denies vaginal bleeding or leakage of fluids. She has also noted lower abdominal cramping since earlier today. She feels that this cramping is not like former contractions. She denies nausea and  vomiting .   OB History    Grav Para Term Preterm Abortions TAB SAB Ect Mult Living   5 3 2 1 1  1   3      Past Medical History  Diagnosis Date  . Anemia     all 3 pregnancies  . Hyperemesis     3rd pregnancy  . Gestational hypertension 2010    with 3rd pregnancy  . Preterm labor     with 3rd pregnancy  . Asthma    Allergies  Allergen Reactions  . Shellfish Allergy    No current facility-administered medications on file prior to encounter.   Current Outpatient Prescriptions on File Prior to Encounter  Medication Sig Dispense Refill  . acetaminophen (TYLENOL) 500 MG tablet Take 500 mg by mouth daily as needed. pain      . Prenatal Vit-Fe Fumarate-FA (MULTIVITAMIN-PRENATAL) 27-0.8 MG TABS Take 1 tablet by mouth daily.         Review of Systems - as per HPI  General appearance: alert, cooperative and no distress Lungs: clear to auscultation bilaterally Heart: regular rate and rhythm, S1, S2 normal, no murmur, click, rub or gallop Abdomen: gravid, no contractions on palpation Extremities: extremities normal, atraumatic, no cyanosis or edema  Reviewed strip and BPP:  FHR: 150, mild variability, no decels, no accesl Toco: irregular contractions  BPP: 8/8  A/P 25 yo multigravida with at 85 and 6 with complaint of decreased fetal movement.  1. Decreased fetal movement: now resolved. BPP 8/8, NST:

## 2010-12-24 NOTE — Progress Notes (Signed)
Addended by: Caren Griffins C on: 12/24/2010 01:20 PM   Modules accepted: Orders

## 2010-12-24 NOTE — Patient Instructions (Signed)
Pregnancy - Third Trimester The third trimester of pregnancy (the last 3 months) is a period of the most rapid growth for you and your baby. The baby approaches a length of 20 inches and a weight of 6 to 10 pounds. The baby is adding on fat and getting ready for life outside your body. While inside, babies have periods of sleeping and waking, suck their thumbs, and hiccups. You can often feel small contractions of the uterus. This is false labor. It is also called Braxton-Hicks contractions. This is like a practice for labor. The usual problems in this stage of pregnancy include more difficulty breathing, swelling of the hands and feet from water retention, and having to urinate more often because of the uterus and baby pressing on your bladder.  PRENATAL EXAMS  Blood work may continue to be done during prenatal exams. These tests are done to check on your health and the probable health of your baby. Blood work is used to follow your blood levels (hemoglobin). Anemia (low hemoglobin) is common during pregnancy. Iron and vitamins are given to help prevent this. You may also continue to be checked for diabetes. Some of the past blood tests may be done again.   The size of the uterus is measured during each visit. This makes sure your baby is growing properly according to your pregnancy dates.   Your blood pressure is checked every prenatal visit. This is to make sure you are not getting toxemia.   Your urine is checked every prenatal visit for infection, diabetes and protein.   Your weight is checked at each visit. This is done to make sure gains are happening at the suggested rate and that you and your baby are growing normally.   Sometimes, an ultrasound is performed to confirm the position and the proper growth and development of the baby. This is a test done that bounces harmless sound waves off the baby so your caregiver can more accurately determine due dates.   Discuss the type of pain  medication and anesthesia you will have during your labor and delivery.   Discuss the possibility and anesthesia if a Cesarean Section might be necessary.   Inform your caregiver if there is any mental or physical violence at home.  Sometimes, a specialized non-stress test, contraction stress test and biophysical profile are done to make sure the baby is not having a problem. Checking the amniotic fluid surrounding the baby is called an amniocentesis. The amniotic fluid is removed by sticking a needle into the belly (abdomen). This is sometimes done near the end of pregnancy if an early delivery is required. In this case, it is done to help make sure the baby's lungs are mature enough for the baby to live outside of the womb. If the lungs are not mature and it is unsafe to deliver the baby, an injection of cortisone medication is given to the mother 1 to 2 days before the delivery. This helps the baby's lungs mature and makes it safer to deliver the baby. CHANGES OCCURING IN THE THIRD TRIMESTER OF PREGNANCY Your body goes through many changes during pregnancy. They vary from person to person. Talk to your caregiver about changes you notice and are concerned about.  During the last trimester, you have probably had an increase in your appetite. It is normal to have cravings for certain foods. This varies from person to person and pregnancy to pregnancy.   You may begin to get stretch marks on your hips,   abdomen, and breasts. These are normal changes in the body during pregnancy. There are no exercises or medications to take which prevent this change.   Constipation may be treated with a stool softener or adding bulk to your diet. Drinking lots of fluids, fiber in vegetables, fruits, and whole grains are helpful.   Exercising is also helpful. If you have been very active up until your pregnancy, most of these activities can be continued during your pregnancy. If you have been less active, it is helpful  to start an exercise program such as walking. Consult your caregiver before starting exercise programs.   Avoid all smoking, alcohol, un-prescribed drugs, herbs and "street drugs" during your pregnancy. These chemicals affect the formation and growth of the baby. Avoid chemicals throughout the pregnancy to ensure the delivery of a healthy infant.   Backache, varicose veins and hemorrhoids may develop or get worse.   You will tire more easily in the third trimester, which is normal.   The baby's movements may be stronger and more often.   You may become short of breath easily.   Your belly button may stick out.   A yellow discharge may leak from your breasts called colostrum.   You may have a bloody mucus discharge. This usually occurs a few days to a week before labor begins.  HOME CARE INSTRUCTIONS  Keep your caregiver's appointments. Follow your caregiver's instructions regarding medication use, exercise, and diet.   During pregnancy, you are providing food for you and your baby. Continue to eat regular, well-balanced meals. Choose foods such as meat, fish, milk and other low fat dairy products, vegetables, fruits, and whole-grain breads and cereals. Your caregiver will tell you of the ideal weight gain.   A physical sexual relationship may be continued throughout pregnancy if there are no other problems such as early (premature) leaking of amniotic fluid from the membranes, vaginal bleeding, or belly (abdominal) pain.   Exercise regularly if there are no restrictions. Check with your caregiver if you are unsure of the safety of your exercises. Greater weight gain will occur in the last 2 trimesters of pregnancy. Exercising helps:   Control your weight.   Get you in shape for labor and delivery.   You lose weight after you deliver.   Rest a lot with legs elevated, or as needed for leg cramps or low back pain.   Wear a good support or jogging bra for breast tenderness during  pregnancy. This may help if worn during sleep. Pads or tissues may be used in the bra if you are leaking colostrum.   Do not use hot tubs, steam rooms, or saunas.   Wear your seat belt when driving. This protects you and your baby if you are in an accident.   Avoid raw meat, cat litter boxes and soil used by cats. These carry germs that can cause birth defects in the baby.   It is easier to loose urine during pregnancy. Tightening up and strengthening the pelvic muscles will help with this problem. You can practice stopping your urination while you are going to the bathroom. These are the same muscles you need to strengthen. It is also the muscles you would use if you were trying to stop from passing gas. You can practice tightening these muscles up 10 times a set and repeating this about 3 times per day. Once you know what muscles to tighten up, do not perform these exercises during urination. It is more likely to   cause an infection by backing up the urine.   Ask for help if you have financial, counseling or nutritional needs during pregnancy. Your caregiver will be able to offer counseling for these needs as well as refer you for other special needs.   Make a list of emergency phone numbers and have them available.   Plan on getting help from family or friends when you go home from the hospital.   Make a trial run to the hospital.   Take prenatal classes with the father to understand, practice and ask questions about the labor and delivery.   Prepare the baby's room/nursery.   Do not travel out of the city unless it is absolutely necessary and with the advice of your caregiver.   Wear only low or no heal shoes to have better balance and prevent falling.  MEDICATIONS AND DRUG USE IN PREGNANCY  Take prenatal vitamins as directed. The vitamin should contain 1 milligram of folic acid. Keep all vitamins out of reach of children. Only a couple vitamins or tablets containing iron may be fatal  to a baby or young child when ingested.   Avoid use of all medications, including herbs, over-the-counter medications, not prescribed or suggested by your caregiver. Only take over-the-counter or prescription medicines for pain, discomfort, or fever as directed by your caregiver. Do not use aspirin, ibuprofen (Motrin, Advil, Nuprin) or naproxen (Aleve) unless OK'd by your caregiver.   Let your caregiver also know about herbs you may be using.   Alcohol is related to a number of birth defects. This includes fetal alcohol syndrome. All alcohol, in any form, should be avoided completely. Smoking will cause low birth rate and premature babies.   Street/illegal drugs are very harmful to the baby. They are absolutely forbidden. A baby born to an addicted mother will be addicted at birth. The baby will go through the same withdrawal an adult does.  SEEK MEDICAL CARE IF  You have any concerns or worries during your pregnancy. It is better to call with your questions if you feel they cannot wait, rather than worry about them.  DECISIONS ABOUT CIRCUMCISION You may or may not know the sex of your baby. If you know your baby is a boy, it may be time to think about circumcision. Circumcision is the removal of the foreskin of the penis. This is the skin that covers the sensitive end of the penis. There is no proven medical need for this. Often this decision is made on what is popular at the time or based upon religious beliefs and social issues. You can discuss these issues with your caregiver or pediatrician. SEEK IMMEDIATE MEDICAL CARE IF:  An unexplained oral temperature above 100 develops, or as your caregiver suggests.   You have leaking of fluid from the vagina (birth canal). If leaking membranes are suspected, take your temperature and tell your caregiver of this when you call.   There is vaginal spotting, bleeding or passing clots. Tell your caregiver of the amount and how many pads are used.    You develop a bad smelling vaginal discharge with a change in the color from clear to white.   You develop vomiting that lasts more than 24 hours.   You develop chills or fever.   You develop shortness of breath.   You develop burning on urination.   You loose more than 2 pounds of weight or gain more than 2 pounds of weight or as suggested by your caregiver.     You notice sudden swelling of your face, hands, and feet or legs.   You develop belly (abdominal) pain. Round ligament discomfort is a common non-cancerous (benign) cause of abdominal pain in pregnancy. Your caregiver still must evaluate you.   You develop a severe headache that does not go away.   You develop visual problems, blurred or double vision.   If you have not felt your baby move for more than 1 hour. If you think the baby is not moving as much as usual, eat something with sugar in it and lie down on your left side for an hour. The baby should move at least 4 to 5 times per hour. Call right away if your baby moves less than that.   You fall, are in a car accident or any kind of trauma.   There is mental or physical violence at home.  Document Released: 03/30/2001 Document Re-Released: 01/31/2009 [employer] Patient Information 2011 [employer]. 

## 2010-12-24 NOTE — Progress Notes (Signed)
Pressure-pelvic and vaginal, Pulse-94, no vaginal discharge.  C/o of migraines, nausea-hard to keep food down.

## 2010-12-24 NOTE — Progress Notes (Signed)
IOL for preE x 2 Subjective:    [name] is a 25 y.o. [phone] [redacted]w[redacted]d being seen today for her obstetrical visit.  Patient reports headache, heartburn, nausea, no bleeding, no contractions, no cramping and no leaking. Fetal movement: decreased. States 1 FM all day today and 5 yesterday. Has signed BTL consent. Reviewed hx IOL x2 for preE. No HTN outside of pregnancy.  Objective:    BP 126/79  Temp 97.5 F (36.4 C)  Wt 182 lb 12.8 oz (82.918 kg)  LMP 05/08/2010  Physical Exam  Exam  FHT:  145 BPM  Uterine Size: 31 cm  Presentation: cephalic     Assessment:    Pregnancy:  [id number]    Plan:    Patient Active Problem List  Diagnoses  . Anemia  . Late prenatal care  . Asthma  . Previous pregnancy complicated by pregnancy-induced hypertension, antepartum  . Previous preterm delivery, antepartum    Consent signed. Follow up in 2 Weeks.

## 2011-01-07 ENCOUNTER — Ambulatory Visit (INDEPENDENT_AMBULATORY_CARE_PROVIDER_SITE_OTHER): Payer: Medicaid Other | Admitting: Physician Assistant

## 2011-01-07 VITALS — BP 129/80 | Temp 98.1°F | Wt 184.4 lb

## 2011-01-07 DIAGNOSIS — IMO0002 Reserved for concepts with insufficient information to code with codable children: Secondary | ICD-10-CM

## 2011-01-07 LAB — POCT URINALYSIS DIP (DEVICE)
Glucose, UA: NEGATIVE mg/dL
Hgb urine dipstick: NEGATIVE
Specific Gravity, Urine: 1.025 (ref 1.005–1.030)

## 2011-01-07 LAB — COMPREHENSIVE METABOLIC PANEL
AST: 10 U/L (ref 0–37)
BUN: 6 mg/dL (ref 6–23)
CO2: 22 mEq/L (ref 19–32)
Calcium: 9.1 mg/dL (ref 8.4–10.5)
Chloride: 107 mEq/L (ref 96–112)
Creat: 0.39 mg/dL — ABNORMAL LOW (ref 0.50–1.10)
Total Bilirubin: 0.4 mg/dL (ref 0.3–1.2)

## 2011-01-07 LAB — CBC
HCT: 31.9 % — ABNORMAL LOW (ref 36.0–46.0)
Hemoglobin: 10.6 g/dL — ABNORMAL LOW (ref 12.0–15.0)
MCH: 28 pg (ref 26.0–34.0)
MCV: 84.4 fL (ref 78.0–100.0)
RBC: 3.78 MIL/uL — ABNORMAL LOW (ref 3.87–5.11)
WBC: 4.4 10*3/uL (ref 4.0–10.5)

## 2011-01-07 NOTE — Progress Notes (Signed)
Edema- feet and ankles.  Pulse-102.  Contractions started two days ago. No vaginal discharge.

## 2011-01-07 NOTE — Progress Notes (Signed)
C/o hemorrhoids Denies Pre x s/s. Hx of IOL for severve pre-x at ~35 weeks x 2. BL labs and 24 hour urine today. Pre-x precautions reviewed.

## 2011-01-08 ENCOUNTER — Telehealth: Payer: Self-pay | Admitting: *Deleted

## 2011-01-08 DIAGNOSIS — O09219 Supervision of pregnancy with history of pre-term labor, unspecified trimester: Secondary | ICD-10-CM

## 2011-01-08 NOTE — Telephone Encounter (Signed)
Pt called to let us know that she only collected 300 ml of urine for her 24 hour urine. I checked with Mammie Lorenzo and she stated that pt needs to have at least 800 cc to send to the lab. Pt advised that she will have to recollect and have labs drawn again. Pt hung up on me while explanation for need to recollect was being given.

## 2011-01-14 ENCOUNTER — Other Ambulatory Visit: Payer: Self-pay | Admitting: Obstetrics & Gynecology

## 2011-01-14 ENCOUNTER — Ambulatory Visit (INDEPENDENT_AMBULATORY_CARE_PROVIDER_SITE_OTHER): Payer: Medicaid Other | Admitting: Family Medicine

## 2011-01-14 VITALS — BP 123/72 | Temp 97.1°F | Wt 185.9 lb

## 2011-01-14 DIAGNOSIS — O0990 Supervision of high risk pregnancy, unspecified, unspecified trimester: Secondary | ICD-10-CM

## 2011-01-14 LAB — URINALYSIS, ROUTINE W REFLEX MICROSCOPIC
Bilirubin Urine: NEGATIVE
Bilirubin Urine: NEGATIVE
Glucose, UA: NEGATIVE
Glucose, UA: NEGATIVE
Glucose, UA: NEGATIVE
Hgb urine dipstick: NEGATIVE
Ketones, ur: 15 — AB
Ketones, ur: NEGATIVE
Leukocytes, UA: NEGATIVE
Nitrite: NEGATIVE
Protein, ur: NEGATIVE
Specific Gravity, Urine: <1.005 — ABNORMAL LOW
Specific Gravity, Urine: 1.01
Urobilinogen, UA: 0.2
pH: 6.5
pH: 6
pH: 7

## 2011-01-14 LAB — COMPREHENSIVE METABOLIC PANEL
ALT: 10
ALT: 14
ALT: 17
AST: 13
AST: 17
AST: 17
Albumin: 2.5 — ABNORMAL LOW
Albumin: 2.6 — ABNORMAL LOW
Alkaline Phosphatase: 101
Alkaline Phosphatase: 74
BUN: 5 — ABNORMAL LOW
CO2: 23
CO2: 24
Calcium: 8.9
Calcium: 9
Calcium: 9
Chloride: 108
Chloride: 105
Creatinine, Ser: 0.43
Creatinine, Ser: 0.56
GFR calc Af Amer: >60
GFR calc Af Amer: >60
GFR calc Af Amer: >60
GFR calc Af Amer: >60
GFR calc non Af Amer: >60
GFR calc non Af Amer: >60
Glucose, Bld: 70
Glucose, Bld: 70
Glucose, Bld: 76
Glucose, Bld: 88
Potassium: 3.2 — ABNORMAL LOW
Potassium: 3.7
Sodium: 137
Sodium: 136
Total Bilirubin: 0.7
Total Bilirubin: 0.9
Total Protein: 6.2
Total Protein: 5.3 — ABNORMAL LOW
Total Protein: 6.5

## 2011-01-14 LAB — DIFFERENTIAL
Basophils Relative: 0
Lymphocytes Relative: 26
Lymphs Abs: 1.7
Monocytes Relative: 8
Neutro Abs: 4.1
Neutrophils Relative %: 65

## 2011-01-14 LAB — URINE MICROSCOPIC-ADD ON

## 2011-01-14 LAB — CBC
HCT: 33.4 — ABNORMAL LOW
Hemoglobin: 12.2
Hemoglobin: 11.5 — ABNORMAL LOW
Hemoglobin: 12
Hemoglobin: 12.4
MCHC: 36.1 — ABNORMAL HIGH
MCHC: 34.5
MCHC: 34.5
MCV: 92.6
MCV: 92.4
Platelets: 279
Platelets: 280
Platelets: 279
RBC: 3.76 — ABNORMAL LOW
RDW: 12.9
RDW: 13.1
RDW: 13.3
WBC: 5.1
WBC: 7.2

## 2011-01-14 LAB — CREATININE CLEARANCE, URINE, 24 HOUR
Collection Interval-CRCL: 24
Creatinine Clearance: 303 — ABNORMAL HIGH
Creatinine, 24H Ur: 1877 — ABNORMAL HIGH
Creatinine, Urine: 98.8

## 2011-01-14 LAB — URINE CULTURE: Colony Count: 30000

## 2011-01-14 LAB — URIC ACID
Uric Acid, Serum: 3.6
Uric Acid, Serum: 4

## 2011-01-14 LAB — POCT URINALYSIS DIP (DEVICE)
Glucose, UA: NEGATIVE mg/dL
Hgb urine dipstick: NEGATIVE
Ketones, ur: NEGATIVE mg/dL
Specific Gravity, Urine: 1.015 (ref 1.005–1.030)

## 2011-01-14 LAB — LACTATE DEHYDROGENASE
LDH: 105
LDH: 155

## 2011-01-14 NOTE — Progress Notes (Signed)
Pt not feeling well today. Has headache, nausea and lots of pelvic pressure. States her feet are very swollen.

## 2011-01-14 NOTE — Progress Notes (Signed)
No other complaints.  Is starting to feel less nauseated and decreased pelvic pressure.  GBS, GC/Cl collected.  rtc 1 week. Will set pt up with 24hour urine as didn't collect enough last week.

## 2011-01-15 LAB — URINALYSIS, ROUTINE W REFLEX MICROSCOPIC
Bilirubin Urine: NEGATIVE
Bilirubin Urine: NEGATIVE
Glucose, UA: NEGATIVE
Glucose, UA: NEGATIVE
Hgb urine dipstick: NEGATIVE
Hgb urine dipstick: NEGATIVE
Ketones, ur: 15 — AB
Leukocytes, UA: NEGATIVE
Protein, ur: 30 — AB
Protein, ur: NEGATIVE
Specific Gravity, Urine: 1.025
Urobilinogen, UA: 1
Urobilinogen, UA: 1

## 2011-01-15 LAB — COMPREHENSIVE METABOLIC PANEL
ALT: 13
AST: 17
AST: 17
Albumin: 2.4 — ABNORMAL LOW
Albumin: 2.5 — ABNORMAL LOW
Alkaline Phosphatase: 104
BUN: 2 — ABNORMAL LOW
CO2: 23
Calcium: 8.8
Calcium: 8.9
Chloride: 109
Creatinine, Ser: 0.54
Creatinine, Ser: 0.56
GFR calc Af Amer: >60
GFR calc Af Amer: >60
GFR calc non Af Amer: >60
Glucose, Bld: 84
Potassium: 3.5
Total Bilirubin: 0.3
Total Protein: 6.4
Total Protein: 7.4

## 2011-01-15 LAB — WET PREP, GENITAL
Clue Cells Wet Prep HPF POC: NONE SEEN
Trich, Wet Prep: NONE SEEN

## 2011-01-15 LAB — CBC
HCT: 30.3 — ABNORMAL LOW
HCT: 33.3 — ABNORMAL LOW
Hemoglobin: 11.5 — ABNORMAL LOW
MCHC: 34.2
MCHC: 34.5
MCV: 91.5
MCV: 93.1
MCV: 93.9
Platelets: 272
Platelets: 301
Platelets: 311
RDW: 13.1
RDW: 13.1
WBC: 5.9
WBC: 6

## 2011-01-15 LAB — GC/CHLAMYDIA PROBE AMP, GENITAL
Chlamydia, DNA Probe: NEGATIVE
GC Probe Amp, Genital: NEGATIVE

## 2011-01-15 LAB — URINE MICROSCOPIC-ADD ON

## 2011-01-15 LAB — CREATININE CLEARANCE, URINE, 24 HOUR
Collection Interval-CRCL: 24
Creatinine, Urine: 76.5
Creatinine: 0.48
Urine Total Volume-CRCL: 1775

## 2011-01-15 LAB — PROTEIN, URINE, 24 HOUR: Protein, 24H Urine: 195 — ABNORMAL HIGH

## 2011-01-15 LAB — WOUND CULTURE
Culture: NO GROWTH
Gram Stain: NONE SEEN

## 2011-01-15 LAB — URINALYSIS, DIPSTICK ONLY
Bilirubin Urine: NEGATIVE
Glucose, UA: NEGATIVE
Ketones, ur: NEGATIVE
Leukocytes, UA: NEGATIVE
Protein, ur: NEGATIVE

## 2011-01-15 LAB — RPR: RPR Ser Ql: NONREACTIVE

## 2011-01-15 LAB — URIC ACID: Uric Acid, Serum: 3.8

## 2011-01-15 LAB — LACTATE DEHYDROGENASE: LDH: 129

## 2011-01-17 LAB — CULTURE, BETA STREP (GROUP B ONLY)

## 2011-01-18 ENCOUNTER — Ambulatory Visit (INDEPENDENT_AMBULATORY_CARE_PROVIDER_SITE_OTHER): Payer: Medicaid Other | Admitting: Obstetrics & Gynecology

## 2011-01-18 ENCOUNTER — Other Ambulatory Visit: Payer: Medicaid Other

## 2011-01-18 VITALS — BP 133/79 | HR 102 | Temp 98.3°F

## 2011-01-18 VITALS — BP 133/79

## 2011-01-18 DIAGNOSIS — Z23 Encounter for immunization: Secondary | ICD-10-CM

## 2011-01-18 DIAGNOSIS — O09899 Supervision of other high risk pregnancies, unspecified trimester: Secondary | ICD-10-CM

## 2011-01-18 DIAGNOSIS — O139 Gestational [pregnancy-induced] hypertension without significant proteinuria, unspecified trimester: Secondary | ICD-10-CM

## 2011-01-18 DIAGNOSIS — IMO0002 Reserved for concepts with insufficient information to code with codable children: Secondary | ICD-10-CM

## 2011-01-18 DIAGNOSIS — O09219 Supervision of pregnancy with history of pre-term labor, unspecified trimester: Secondary | ICD-10-CM

## 2011-01-18 DIAGNOSIS — O09299 Supervision of pregnancy with other poor reproductive or obstetric history, unspecified trimester: Secondary | ICD-10-CM

## 2011-01-18 DIAGNOSIS — O368190 Decreased fetal movements, unspecified trimester, not applicable or unspecified: Secondary | ICD-10-CM

## 2011-01-18 DIAGNOSIS — O169 Unspecified maternal hypertension, unspecified trimester: Secondary | ICD-10-CM

## 2011-01-18 LAB — COMPREHENSIVE METABOLIC PANEL
ALT: 9 U/L (ref 0–35)
Alkaline Phosphatase: 101 U/L (ref 39–117)
Glucose, Bld: 77 mg/dL (ref 70–99)
Sodium: 137 mEq/L (ref 135–145)
Total Bilirubin: 0.3 mg/dL (ref 0.3–1.2)
Total Protein: 6 g/dL (ref 6.0–8.3)

## 2011-01-18 LAB — CBC
MCH: 28 pg (ref 26.0–34.0)
MCHC: 34 g/dL (ref 30.0–36.0)
Platelets: 266 10*3/uL (ref 150–400)

## 2011-01-18 LAB — CREATININE CLEARANCE, URINE, 24 HOUR
Creatinine Clearance: 273 mL/min — ABNORMAL HIGH (ref 75–115)
Creatinine, 24H Ur: 1376 mg/d (ref 700–1800)
Creatinine, Urine: 65.5 mg/dL

## 2011-01-18 LAB — PROTEIN, URINE, 24 HOUR: Protein, 24H Urine: 210 mg/d — ABNORMAL HIGH (ref 50–100)

## 2011-01-18 MED ORDER — INFLUENZA VIRUS VACC SPLIT PF IM SUSP
0.5000 mL | Freq: Once | INTRAMUSCULAR | Status: AC
  Administered 2011-01-18: 0.5 mL via INTRAMUSCULAR

## 2011-01-18 MED ORDER — ONDANSETRON 4 MG PO TBDP: 4.0000 mg | ORAL_TABLET | Freq: Four times a day (QID) | ORAL | Status: AC | PRN

## 2011-01-18 NOTE — Progress Notes (Signed)
P = 102     BP re-check = 129/77.    Pt reports not feeling well today; nausea, vomiting.  Pt brought 24 hr urine today, labs drawn.

## 2011-01-18 NOTE — Progress Notes (Signed)
Patient seen today for antenatal testing.  NST performed on 01/18/2011 was reviewed and was found to be reactive.  Continue recommended antenatal testing and prenatal care. As for her nausea, her BP is normal, will follow up  24 hour urine and labs.  Zofran e-prescribed.  BP/FM/PTL precautions reviewed.  RTC in 3 days for reevaluation. GBS, GC, Chlam all negative.

## 2011-01-21 ENCOUNTER — Other Ambulatory Visit: Payer: Self-pay | Admitting: Obstetrics & Gynecology

## 2011-01-21 ENCOUNTER — Ambulatory Visit (INDEPENDENT_AMBULATORY_CARE_PROVIDER_SITE_OTHER): Payer: Medicaid Other | Admitting: Obstetrics & Gynecology

## 2011-01-21 VITALS — BP 127/80 | Temp 97.5°F | Wt 185.7 lb

## 2011-01-21 DIAGNOSIS — O0930 Supervision of pregnancy with insufficient antenatal care, unspecified trimester: Secondary | ICD-10-CM

## 2011-01-21 DIAGNOSIS — O169 Unspecified maternal hypertension, unspecified trimester: Secondary | ICD-10-CM

## 2011-01-21 LAB — TYPE AND SCREEN
ABO/RH(D): O POS
Antibody Screen: NEGATIVE

## 2011-01-21 LAB — DIFFERENTIAL
Basophils Absolute: 0 10*3/uL (ref 0.0–0.1)
Basophils Relative: 0 % (ref 0–1)
Lymphocytes Relative: 23 % (ref 12–46)
Monocytes Absolute: 0.3 10*3/uL (ref 0.1–1.0)
Neutro Abs: 4.7 10*3/uL (ref 1.7–7.7)
Neutrophils Relative %: 71 % (ref 43–77)

## 2011-01-21 LAB — POCT URINALYSIS DIP (DEVICE)
Ketones, ur: NEGATIVE mg/dL
Nitrite: NEGATIVE
Protein, ur: 30 mg/dL — AB

## 2011-01-21 LAB — POCT PREGNANCY, URINE: Preg Test, Ur: POSITIVE

## 2011-01-21 LAB — CBC
Hemoglobin: 11.1 g/dL — ABNORMAL LOW (ref 12.0–15.0)
Platelets: 332 10*3/uL (ref 150–400)
RDW: 13.8 % (ref 11.5–15.5)

## 2011-01-21 LAB — HCG, QUANTITATIVE, PREGNANCY: hCG, Beta Chain, Quant, S: 3139 m[IU]/mL — ABNORMAL HIGH (ref <5)

## 2011-01-21 LAB — WET PREP, GENITAL

## 2011-01-21 NOTE — Progress Notes (Signed)
Addended by: Raynald Blend L on: 01/21/2011 12:20 PM   Modules accepted: Orders

## 2011-01-21 NOTE — Progress Notes (Signed)
Edema-feet,  Pain/pressure-pelvic,vaginal, pulse- 101

## 2011-01-21 NOTE — Progress Notes (Signed)
24 hr urine 10/1 221mg    No problems except decreased movement. Needs NST today, U/s for growth.

## 2011-01-25 ENCOUNTER — Ambulatory Visit (INDEPENDENT_AMBULATORY_CARE_PROVIDER_SITE_OTHER): Payer: Medicaid Other | Admitting: *Deleted

## 2011-01-25 ENCOUNTER — Ambulatory Visit (HOSPITAL_COMMUNITY)
Admission: RE | Admit: 2011-01-25 | Discharge: 2011-01-25 | Disposition: A | Discharge status: Home / Self Care | Payer: Medicaid Other | Source: Ambulatory Visit | Attending: Obstetrics & Gynecology | Admitting: Obstetrics & Gynecology

## 2011-01-25 DIAGNOSIS — Z3689 Encounter for other specified antenatal screening: Secondary | ICD-10-CM | POA: Insufficient documentation

## 2011-01-25 DIAGNOSIS — O139 Gestational [pregnancy-induced] hypertension without significant proteinuria, unspecified trimester: Secondary | ICD-10-CM

## 2011-01-25 DIAGNOSIS — O169 Unspecified maternal hypertension, unspecified trimester: Secondary | ICD-10-CM

## 2011-01-25 DIAGNOSIS — O365990 Maternal care for other known or suspected poor fetal growth, unspecified trimester, not applicable or unspecified: Secondary | ICD-10-CM | POA: Insufficient documentation

## 2011-01-25 DIAGNOSIS — O0930 Supervision of pregnancy with insufficient antenatal care, unspecified trimester: Secondary | ICD-10-CM

## 2011-01-25 NOTE — Progress Notes (Signed)
P = 105   NST only today.  [place] growth done today

## 2011-01-28 ENCOUNTER — Ambulatory Visit (INDEPENDENT_AMBULATORY_CARE_PROVIDER_SITE_OTHER): Payer: Medicaid Other | Admitting: Obstetrics and Gynecology

## 2011-01-28 VITALS — BP 117/76 | Wt 186.5 lb

## 2011-01-28 DIAGNOSIS — O169 Unspecified maternal hypertension, unspecified trimester: Secondary | ICD-10-CM

## 2011-01-28 LAB — POCT URINALYSIS DIP (DEVICE)
Bilirubin Urine: NEGATIVE
Glucose, UA: NEGATIVE mg/dL
Hgb urine dipstick: NEGATIVE
Ketones, ur: NEGATIVE mg/dL
Specific Gravity, Urine: 1.025 (ref 1.005–1.030)

## 2011-01-28 NOTE — Progress Notes (Signed)
Pulse 95. Swelling in ankles and feet. Pelvic pressure. No vaginal discharge.

## 2011-01-28 NOTE — Progress Notes (Signed)
I have reviewed the reactive NST. FM/labor precautions reviewed. Patient reports occ irregular contractions, declined vaginal exam today.

## 2011-01-29 LAB — URINALYSIS, ROUTINE W REFLEX MICROSCOPIC
Glucose, UA: NEGATIVE
Hgb urine dipstick: NEGATIVE
Protein, ur: NEGATIVE
Specific Gravity, Urine: >1.03 — ABNORMAL HIGH
Urobilinogen, UA: 0.2

## 2011-01-29 LAB — CBC
HCT: 37.2
MCV: 88.6
Platelets: 362
RBC: 4.2
WBC: 4.9

## 2011-01-29 LAB — ABO/RH: ABO/RH(D): O POS

## 2011-01-30 ENCOUNTER — Encounter (HOSPITAL_COMMUNITY): Payer: Self-pay | Admitting: *Deleted

## 2011-01-30 ENCOUNTER — Inpatient Hospital Stay (HOSPITAL_COMMUNITY)
Admission: AD | Admit: 2011-01-30 | Discharge: 2011-01-30 | Disposition: A | Discharge status: Home / Self Care | Payer: Medicaid Other | Source: Ambulatory Visit | Attending: Obstetrics and Gynecology | Admitting: Obstetrics and Gynecology

## 2011-01-30 DIAGNOSIS — O99891 Other specified diseases and conditions complicating pregnancy: Secondary | ICD-10-CM | POA: Insufficient documentation

## 2011-01-30 DIAGNOSIS — O09219 Supervision of pregnancy with history of pre-term labor, unspecified trimester: Secondary | ICD-10-CM

## 2011-01-30 DIAGNOSIS — R040 Epistaxis: Secondary | ICD-10-CM

## 2011-01-30 DIAGNOSIS — O368190 Decreased fetal movements, unspecified trimester, not applicable or unspecified: Secondary | ICD-10-CM | POA: Insufficient documentation

## 2011-01-30 NOTE — Progress Notes (Signed)
Pt reports feeet and legs are swelling up , C/O headache and nose bleed this morning and decreased fetal movment

## 2011-01-30 NOTE — ED Provider Notes (Signed)
History   [id number] in with c/o nosebleed and swelling this am. On adm to unit nosebleed had resolved. Pt denies headache, dizziness, blurred vision, or epigastric pain.  Chief Complaint  Patient presents with  . Epistaxis  . Headache  . Decreased Fetal Movement   HPI  OB History    Grav Para Term Preterm Abortions TAB SAB Ect Mult Living   5 3 2 1 1  1   3       Past Medical History  Diagnosis Date  . Anemia     all 3 pregnancies  . Hyperemesis     3rd pregnancy  . Gestational hypertension 2010    with 3rd pregnancy  . Preterm labor     with 3rd pregnancy  . Asthma     Past Surgical History  Procedure Date  . Dilation and curettage of uterus 2010    Family History  Problem Relation Age of Onset  . Hypertension Father   . Hypertension Mother   . Anemia Mother     History  Substance Use Topics  . Smoking status: Never Smoker   . Smokeless tobacco: Not on file  . Alcohol Use: No    Allergies:  Allergies  Allergen Reactions  . Shellfish Allergy     Prescriptions prior to admission  Medication Sig Dispense Refill  . albuterol (PROVENTIL HFA;VENTOLIN HFA) 108 (90 BASE) MCG/ACT inhaler Inhale 2 puffs into the lungs every 6 (six) hours as needed. Shortness of breath/asthma       . calcium carbonate (TUMS EX) 750 MG chewable tablet Chew 1 tablet by mouth 2 (two) times daily.        . Prenatal Vit-Fe Fumarate-FA (MULTIVITAMIN-PRENATAL) 27-0.8 MG TABS Take 1 tablet by mouth daily.       Marland Kitchen acetaminophen (TYLENOL) 500 MG tablet Take 500 mg by mouth daily as needed. pain      . omeprazole (PRILOSEC OTC) 20 MG tablet Take 20 mg by mouth daily. Has not picked up yet         Review of Systems  Constitutional: Negative.   HENT: Negative.   Eyes: Negative.   Respiratory: Negative.   Cardiovascular: Negative.   Gastrointestinal: Negative.   Genitourinary: Negative.   Musculoskeletal: Negative.   Skin: Negative.   Neurological: Negative.   Endo/Heme/Allergies:  Negative.   Psychiatric/Behavioral: Negative.    Physical Exam   Blood pressure 134/79, pulse 99, temperature 98.6 F (37 C), temperature source Oral, resp. rate 18, height 5\' 6"  (1.676 m), weight 85.276 kg (188 lb), last menstrual period 05/08/2010.  Physical Exam  Constitutional: She is oriented to person, place, and time. She appears well-developed and well-nourished.  HENT:  Head: Normocephalic.  Neck: Normal range of motion.  Cardiovascular: Normal rate and regular rhythm.   Respiratory: Effort normal and breath sounds normal.  GI: Soft. Bowel sounds are normal.  Genitourinary: Vagina normal and uterus normal.  Musculoskeletal: Normal range of motion.  Neurological: She is alert and oriented to person, place, and time. She has normal reflexes.  Skin: Skin is warm and dry.  Psychiatric: She has a normal mood and affect. Her behavior is normal. Judgment and thought content normal.    MAU Course  Procedures  MDM   Assessment and Plan  Stable maternal fetal unit. Normotensive will d/c home to follow up in high risk clinic on Monday.  Zerita Boers 01/30/2011, 4:24 PM

## 2011-02-01 ENCOUNTER — Other Ambulatory Visit: Payer: Medicaid Other

## 2011-02-01 LAB — PREGNANCY, URINE: Preg Test, Ur: POSITIVE

## 2011-02-01 LAB — URINE MICROSCOPIC-ADD ON

## 2011-02-01 LAB — URINALYSIS, ROUTINE W REFLEX MICROSCOPIC
Bilirubin Urine: NEGATIVE
Nitrite: NEGATIVE
Specific Gravity, Urine: >1.03 — ABNORMAL HIGH
Urobilinogen, UA: 0.2

## 2011-02-01 NOTE — ED Provider Notes (Signed)
Agree with above note.  [name] 02/01/2011 2:04 PM   

## 2011-02-02 ENCOUNTER — Encounter (HOSPITAL_COMMUNITY): Payer: Self-pay

## 2011-02-02 ENCOUNTER — Encounter (HOSPITAL_COMMUNITY): Payer: Self-pay | Admitting: Anesthesiology

## 2011-02-02 ENCOUNTER — Inpatient Hospital Stay (HOSPITAL_COMMUNITY)
Admission: AD | Admit: 2011-02-02 | Discharge: 2011-02-04 | DRG: 775 | Disposition: A | Discharge status: Home / Self Care | Payer: Medicaid Other | Source: Ambulatory Visit | Attending: Obstetrics & Gynecology | Admitting: Obstetrics & Gynecology

## 2011-02-02 ENCOUNTER — Telehealth: Payer: Self-pay | Admitting: *Deleted

## 2011-02-02 DIAGNOSIS — O139 Gestational [pregnancy-induced] hypertension without significant proteinuria, unspecified trimester: Principal | ICD-10-CM | POA: Diagnosis present

## 2011-02-02 DIAGNOSIS — IMO0001 Reserved for inherently not codable concepts without codable children: Secondary | ICD-10-CM

## 2011-02-02 DIAGNOSIS — O09219 Supervision of pregnancy with history of pre-term labor, unspecified trimester: Secondary | ICD-10-CM

## 2011-02-02 DIAGNOSIS — O165 Unspecified maternal hypertension, complicating the puerperium: Secondary | ICD-10-CM

## 2011-02-02 LAB — CBC
MCV: 83.8 fL (ref 78.0–100.0)
Platelets: 278 10*3/uL (ref 150–400)
RDW: 13.2 % (ref 11.5–15.5)
WBC: 6.3 10*3/uL (ref 4.0–10.5)

## 2011-02-02 MED ORDER — OXYTOCIN BOLUS FROM INFUSION
500.0000 mL | Freq: Once | INTRAVENOUS | Status: DC
  Filled 2011-02-02: qty 500
  Filled 2011-02-02: qty 1000

## 2011-02-02 MED ORDER — LIDOCAINE HCL (PF) 1 % IJ SOLN
30.0000 mL | INTRAMUSCULAR | Status: DC | PRN
  Filled 2011-02-02: qty 30

## 2011-02-02 MED ORDER — TETANUS-DIPHTH-ACELL PERTUSSIS 5-2.5-18.5 LF-MCG/0.5 IM SUSP
0.5000 mL | Freq: Once | INTRAMUSCULAR | Status: AC
  Administered 2011-02-03: 0.5 mL via INTRAMUSCULAR
  Filled 2011-02-02: qty 0.5

## 2011-02-02 MED ORDER — ONDANSETRON HCL 4 MG/2ML IJ SOLN: 4.0000 mg | Freq: Four times a day (QID) | INTRAMUSCULAR | Status: DC | PRN

## 2011-02-02 MED ORDER — IBUPROFEN 600 MG PO TABS
600.0000 mg | ORAL_TABLET | Freq: Four times a day (QID) | ORAL | Status: DC | PRN
  Administered 2011-02-02: 600 mg via ORAL
  Filled 2011-02-02: qty 1

## 2011-02-02 MED ORDER — DIPHENHYDRAMINE HCL 25 MG PO CAPS: 25.0000 mg | ORAL_CAPSULE | Freq: Four times a day (QID) | ORAL | Status: DC | PRN

## 2011-02-02 MED ORDER — LANOLIN HYDROUS EX OINT: TOPICAL_OINTMENT | CUTANEOUS | Status: DC | PRN

## 2011-02-02 MED ORDER — OXYCODONE-ACETAMINOPHEN 5-325 MG PO TABS: 1.0000 | ORAL_TABLET | ORAL | Status: DC | PRN

## 2011-02-02 MED ORDER — SIMETHICONE 80 MG PO CHEW: 80.0000 mg | CHEWABLE_TABLET | ORAL | Status: DC | PRN

## 2011-02-02 MED ORDER — SENNOSIDES-DOCUSATE SODIUM 8.6-50 MG PO TABS: 2.0000 | ORAL_TABLET | Freq: Every day | ORAL | Status: DC

## 2011-02-02 MED ORDER — CITRIC ACID-SODIUM CITRATE 334-500 MG/5ML PO SOLN: 30.0000 mL | ORAL | Status: DC | PRN

## 2011-02-02 MED ORDER — FLEET ENEMA 7-19 GM/118ML RE ENEM: 1.0000 | ENEMA | RECTAL | Status: DC | PRN

## 2011-02-02 MED ORDER — WITCH HAZEL-GLYCERIN EX PADS: 1.0000 "application " | MEDICATED_PAD | CUTANEOUS | Status: DC | PRN

## 2011-02-02 MED ORDER — LACTATED RINGERS IV SOLN
INTRAVENOUS | Status: DC
  Administered 2011-02-02: 14:00:00 via INTRAVENOUS

## 2011-02-02 MED ORDER — OXYTOCIN 20 UNITS IN LACTATED RINGERS INFUSION - SIMPLE
INTRAVENOUS | Status: AC
  Filled 2011-02-02: qty 1000

## 2011-02-02 MED ORDER — NALOXONE HCL 0.4 MG/ML IJ SOLN
INTRAMUSCULAR | Status: AC
  Filled 2011-02-02: qty 1

## 2011-02-02 MED ORDER — OXYTOCIN 20 UNITS IN LACTATED RINGERS INFUSION - SIMPLE
125.0000 mL/h | Freq: Once | INTRAVENOUS | Status: AC
  Administered 2011-02-02: 125 mL/h via INTRAVENOUS

## 2011-02-02 MED ORDER — NALBUPHINE HCL 10 MG/ML IJ SOLN: 10.0000 mg | INTRAMUSCULAR | Status: DC | PRN

## 2011-02-02 MED ORDER — ONDANSETRON HCL 4 MG PO TABS: 4.0000 mg | ORAL_TABLET | ORAL | Status: DC | PRN

## 2011-02-02 MED ORDER — OXYCODONE-ACETAMINOPHEN 5-325 MG PO TABS: 2.0000 | ORAL_TABLET | ORAL | Status: DC | PRN

## 2011-02-02 MED ORDER — BENZOCAINE-MENTHOL 20-0.5 % EX AERO: 1.0000 "application " | INHALATION_SPRAY | CUTANEOUS | Status: DC | PRN

## 2011-02-02 MED ORDER — PRENATAL PLUS 27-1 MG PO TABS
1.0000 | ORAL_TABLET | Freq: Every day | ORAL | Status: DC
  Administered 2011-02-03 – 2011-02-04 (×2): 1 via ORAL
  Filled 2011-02-02 (×2): qty 1

## 2011-02-02 MED ORDER — ALBUTEROL SULFATE HFA 108 (90 BASE) MCG/ACT IN AERS
2.0000 | INHALATION_SPRAY | Freq: Four times a day (QID) | RESPIRATORY_TRACT | Status: DC | PRN
  Filled 2011-02-02: qty 6.7

## 2011-02-02 MED ORDER — ACETAMINOPHEN 325 MG PO TABS: 650.0000 mg | ORAL_TABLET | ORAL | Status: DC | PRN

## 2011-02-02 MED ORDER — LIDOCAINE HCL (PF) 1 % IJ SOLN
INTRAMUSCULAR | Status: AC
  Filled 2011-02-02: qty 30

## 2011-02-02 MED ORDER — NALBUPHINE SYRINGE 5 MG/0.5 ML
10.0000 mg | INJECTION | INTRAMUSCULAR | Status: DC | PRN
  Filled 2011-02-02: qty 1

## 2011-02-02 MED ORDER — OXYTOCIN 20 UNITS IN LACTATED RINGERS INFUSION - SIMPLE
1.0000 m[IU]/min | INTRAVENOUS | Status: DC
  Administered 2011-02-02: 1 m[IU]/min via INTRAVENOUS
  Administered 2011-02-02: 9 m[IU]/min via INTRAVENOUS

## 2011-02-02 MED ORDER — NALBUPHINE SYRINGE 5 MG/0.5 ML
10.0000 mg | INJECTION | INTRAMUSCULAR | Status: DC | PRN
  Administered 2011-02-02: 10 mg via INTRAVENOUS
  Filled 2011-02-02 (×2): qty 1

## 2011-02-02 MED ORDER — IBUPROFEN 600 MG PO TABS
600.0000 mg | ORAL_TABLET | Freq: Four times a day (QID) | ORAL | Status: DC
  Administered 2011-02-03 – 2011-02-04 (×6): 600 mg via ORAL
  Filled 2011-02-02 (×6): qty 1

## 2011-02-02 MED ORDER — DIBUCAINE 1 % RE OINT: 1.0000 "application " | TOPICAL_OINTMENT | RECTAL | Status: DC | PRN

## 2011-02-02 MED ORDER — TERBUTALINE SULFATE 1 MG/ML IJ SOLN: 0.2500 mg | Freq: Once | INTRAMUSCULAR | Status: DC | PRN

## 2011-02-02 MED ORDER — ZOLPIDEM TARTRATE 5 MG PO TABS: 5.0000 mg | ORAL_TABLET | Freq: Every evening | ORAL | Status: DC | PRN

## 2011-02-02 MED ORDER — LACTATED RINGERS IV SOLN: 500.0000 mL | INTRAVENOUS | Status: DC | PRN

## 2011-02-02 MED ORDER — ONDANSETRON HCL 4 MG/2ML IJ SOLN: 4.0000 mg | INTRAMUSCULAR | Status: DC | PRN

## 2011-02-02 NOTE — Anesthesia Preprocedure Evaluation (Deleted)
Anesthesia Evaluation  Name, MR# and DOB Patient awake  General Assessment Comment  Reviewed: Allergy & Precautions, H&P , Patient's Chart, lab work & pertinent test results  Airway Mallampati: III TM Distance: >3 FB Neck ROM: full    Dental No notable dental hx.    Pulmonary asthma  clear to auscultation  Pulmonary exam normal       Cardiovascular hypertension, regular Normal    Neuro/Psych Negative Neurological ROS  Negative Psych ROS   GI/Hepatic negative GI ROS Neg liver ROS    Endo/Other  Negative Endocrine ROS  Renal/GU negative Renal ROS     Musculoskeletal   Abdominal   Peds  Hematology negative hematology ROS (+)   Anesthesia Other Findings   Reproductive/Obstetrics (+) Pregnancy                           Anesthesia Physical Anesthesia Plan  ASA: III  Anesthesia Plan: Epidural   Post-op Pain Management:    Induction:   Airway Management Planned:   Additional Equipment:   Intra-op Plan:   Post-operative Plan:   Informed Consent: I have reviewed the patients History and Physical, chart, labs and discussed the procedure including the risks, benefits and alternatives for the proposed anesthesia with the patient or authorized representative who has indicated his/her understanding and acceptance.     Plan Discussed with:   Anesthesia Plan Comments:         Anesthesia Quick Evaluation

## 2011-02-02 NOTE — Progress Notes (Signed)
Pt transferred to room 147 via w/c in stable condition. Report given to mother-baby RN. Infant transferred with patient via crib.

## 2011-02-02 NOTE — Progress Notes (Signed)
Pt delivered viable female viable with apgars 8, 9. Cam Hai CNM present.

## 2011-02-02 NOTE — Progress Notes (Signed)
[name] is a 25 y.o. [phone] at [redacted]w[redacted]d admitted for rupture of membranes and active labor.   Subjective: The patient has minimal pain, no headache, will request an epidural if needed; no concerns at this time   Objective: BP 138/75  Pulse 109  Temp(Src) 97.8 F (36.6 C) (Oral)  Resp 18  Ht 5\' 6"  (1.676 m)  Wt 189 lb 4 oz (85.843 kg)  BMI 30.55 kg/m2  LMP 05/08/2010      FHT:  FHR: 135 bpm, variability: moderate,  accelerations:  Abscent,  decelerations:  Absent UC:   irregular, every 6 minutes SVE:   Dilation: 4.5 Effacement (%): 70 Station: -2 Exam by:: dr Clinton Sawyer  Labs: Lab Results  Component Value Date   WBC 6.3 02/02/2011   HGB 11.2* 02/02/2011   HCT 33.2* 02/02/2011   MCV 83.8 02/02/2011   PLT 278 02/02/2011    Assessment / Plan: [name] is [phone] with a history of gestational HTN and pre-eclampsia with a  previous pregnancy who is normotensive right now and is in active labor s/p SROM.   Labor: Minimal dilation since last check, consider starting Pitocin, will allow patient to walk halls  Preeclampsia:  No evidence of pre-e nowl Fetal Wellbeing:  Category II Pain Control:  Labor support without medications I/D:  n/a Anticipated MOD:  NSVD   Re-assess patient in 2-3 hours unless otherwise indicated.   Clinton Sawyer, Meleena Munroe 02/02/2011, 2:58 PM

## 2011-02-02 NOTE — Progress Notes (Signed)
Pt states having lof, brown colored, non-odorous, +FM, ?srom at 1110. Was 3cm in office Thursday.

## 2011-02-02 NOTE — Telephone Encounter (Signed)
Pt called and stated she was having contractions today. Also she is having brown watery discharge with speckles. Pt states at last exam she was 3 cm dilated. Advised pt to go to MAU

## 2011-02-02 NOTE — H&P (Signed)
[name] is a 25 y.o. female presenting for concern of ROM at around 11 AM today. She gets her care in the [facility] here on Thursdays for concern of HTN and history of PreE requiring preterm delivery in the past. Maternal Medical History:  Reason for admission: Reason for admission: rupture of membranes.  Reason for Admission:   nauseaContractions: Onset was less than 1 hour ago.   Frequency: irregular.   Perceived severity is mild.    Fetal activity: Perceived fetal activity is normal.   Last perceived fetal movement was within the past hour.    Prenatal complications: Hypertension.   No bleeding.   History of PreE and IOL preterm b/c of condition. Has been monitored this pregnancy and patient reports negative work-up, just chronic HTN not on medications.  Prenatal Complications - Diabetes: none.    OB History    Grav Para Term Preterm Abortions TAB SAB Ect Mult Living   5 3 2 1 1  1   3      Past Medical History  Diagnosis Date  . Anemia     all 3 pregnancies  . Hyperemesis     3rd pregnancy  . Gestational hypertension 2010    with 3rd pregnancy  . Preterm labor     with 3rd pregnancy  . Asthma    Past Surgical History  Procedure Date  . Dilation and curettage of uterus 2010   Family History: family history includes Anemia in her mother and Hypertension in her father and mother. Social History:  reports that she has never smoked. She does not have any smokeless tobacco history on file. She reports that she does not drink alcohol or use illicit drugs.  Review of Systems  Constitutional: Negative for fever and chills.  Respiratory: Negative for cough.   Cardiovascular: Negative for chest pain.  Gastrointestinal: Negative for heartburn, nausea and abdominal pain.  Genitourinary: Negative for dysuria.  Skin: Negative for rash.  Neurological: Negative for dizziness and headaches.  Psychiatric/Behavioral: Negative for depression.    Dilation: 5 Effacement (%):  90 Station: -2 Exam by:: S Manu Rubey  Blood pressure 148/81, pulse 108, temperature 98.3 F (36.8 C), temperature source Oral, resp. rate 16, height 5\' 6"  (1.676 m), weight 85.843 kg (189 lb 4 oz), last menstrual period 05/08/2010. Maternal Exam:  Uterine Assessment: Contraction strength is mild.  Contraction frequency is irregular.   Abdomen: Fetal presentation: vertex  Introitus: Normal vulva. Normal vagina.  Pelvis: adequate for delivery.   Cervix: Cervix evaluated by digital exam.     Fetal Exam Fetal Monitor Review: Mode: ultrasound.   Baseline rate: 145.  Variability: moderate (6-25 bpm).   Pattern: accelerations present and no decelerations.    Fetal State Assessment: Category I - tracings are normal.     Physical Exam  Constitutional: She is oriented to person, place, and time. She appears well-developed and well-nourished. No distress.  HENT:  Head: Normocephalic.  Eyes: Pupils are equal, round, and reactive to light.  Neck: Normal range of motion.  Cardiovascular: Normal rate, regular rhythm and normal heart sounds.   No murmur heard. Respiratory: Effort normal and breath sounds normal. No respiratory distress. She has no wheezes.  GI: Soft. Bowel sounds are normal. She exhibits no distension. There is no tenderness.  Musculoskeletal: Normal range of motion.  Neurological: She is alert and oriented to person, place, and time. She has normal reflexes. She displays normal reflexes. A cranial nerve deficit is present.  Skin: Skin is warm  and dry. No rash noted. She is not diaphoretic.  Psychiatric: She has a normal mood and affect.    Dilation: 5 Effacement (%): 90 Station: -2 Presentation: Vertex Exam by:: S Vishruth Seoane    Prenatal labs: ABO, Rh:  O Pos Antibody:  neg Rubella:  immune (found under Media tab for OB Prenatal) RPR: NON REAC (08/02 0817)  HBsAg:   neg (found under Media tab for OB Prenatal) HIV: NON REACTIVE (08/02 0817)  GBS:    Negative  Assessment/Plan: Fern +, IUP @ 38.[redacted] wks EGA, PIH vs Chronic HTN. Previous w/u for PreE has been negative. NML BP's and no ssx today. 1. Admit for L&D, augment labor as necessary with pitocin IV 2. GBS neg, no indication at this time for abx 3. Plans to breast and formula feed 4. Desires BTL after delivery  [name] 02/02/2011, 1:00 PM

## 2011-02-02 NOTE — ED Provider Notes (Signed)
Patient is admitted for L&D due to SROM. See admission H&P

## 2011-02-03 MED ORDER — HYDROCHLOROTHIAZIDE 25 MG PO TABS
25.0000 mg | ORAL_TABLET | Freq: Every day | ORAL | Status: DC
  Administered 2011-02-03 – 2011-02-04 (×2): 25 mg via ORAL
  Filled 2011-02-03 (×3): qty 1

## 2011-02-03 NOTE — Progress Notes (Signed)
Post Partum Day 1 s/p SVD  Subjective: no complaints, up ad lib, voiding, tolerating PO and + flatus; patient denies headache, changes in vision, abdominal pain and shortness of breath   Objective: Temp:  [97.7 F (36.5 C)-98.4 F (36.9 C)] 98.4 F (36.9 C) (10/17 0626) Pulse Rate:  [91-116] 98  (10/17 0626) Resp:  [16-20] 18  (10/17 0626) BP: (123-149)/(64-92) 146/87 mmHg (10/17 0626) Weight:  [189 lb 4 oz (85.843 kg)] 189 lb 4 oz (85.843 kg) (10/16 1140)  Physical Exam:  General: alert, cooperative and no distress Lochia: appropriate Uterine Fundus: firm DVT Evaluation: No evidence of DVT seen on physical exam. Neuro: Brisk patellar reflexes bilaterally, normal achilles reflexes, no clonus present Abd: non-tender    Basename 02/02/11 1405  HGB 11.2*  HCT 33.2*    Assessment/Plan: [name] is a 25 year old [id number] who is PPD1 and recovering well with only complication of hypertension. No evidence of pre-eclampsia, but will be very watchful given her history of pre-eclampsia with previous pregnancy. Start patient on HCTZ and monitor BP.    LOS: 1 day   Mat Carne 02/03/2011, 7:26 AM

## 2011-02-03 NOTE — Progress Notes (Signed)
UR chart review completed.  

## 2011-02-04 ENCOUNTER — Other Ambulatory Visit: Payer: Medicaid Other

## 2011-02-04 ENCOUNTER — Encounter: Payer: Self-pay | Admitting: Physician Assistant

## 2011-02-04 MED ORDER — HYDROCHLOROTHIAZIDE 25 MG PO TABS: 25.0000 mg | ORAL_TABLET | Freq: Every day | ORAL | Status: DC

## 2011-02-04 NOTE — Discharge Summary (Signed)
Obstetric Discharge Summary Reason for Admission: onset of labor Prenatal Procedures: ultrasound Intrapartum Procedures: spontaneous vaginal delivery Postpartum Procedures: none Complications-Operative and Postpartum: Hypertension  Hemoglobin  Date Value Range Status  02/02/2011 11.2* 12.0-15.0 (g/dL) Final     HCT  Date Value Range Status  02/02/2011 33.2* 36.0-46.0 (%) Final   Hospital Course: [name] presented at 38.4 with SROM and active labor. She had an uncomplicated NSVD of a healthy baby girl. The patient had gestational hypertension that was untreated and persisted 24 hours after delivery. She had no evidence of pre-eclampsia. She was started on HCTZ 25 mg daily and will follow-up in the outpatient clinic at [facility] within one week for a blood pressure check.   Discharge Diagnoses: Term Pregnancy-delivered and Hypertension  Discharge Information: Date: 02/04/2011 Activity: pelvic rest Diet: routine Medications: None Condition: stable Instructions: refer to practice specific booklet Discharge to: home Follow-up Information    Follow up with [facility]. Make an appointment in 7 days. (Make appointment to check blood pressure)    Contact information:   [address] [place] [address]          Newborn Data: Live born female  Birth Weight: 6 lb 6.7 oz (2910 g) APGAR: 9, 9  Home with mother.  [name], [name] 02/04/2011, 12:03 PM

## 2011-02-04 NOTE — Progress Notes (Signed)
Post Partum Day 2 s/p NSVD  Subjective: no complaints, up ad lib, voiding, tolerating PO and + flatus  no concerns about going home Objective: Temp:  [98.3 F (36.8 C)-98.8 F (37.1 C)] 98.3 F (36.8 C) (10/18 0535) Pulse Rate:  [96-109] 96  (10/18 0535) Resp:  [19-22] 19  (10/18 0535) BP: (129-145)/(80-86) 129/82 mmHg (10/18 0535) SpO2:  [98 %] 98 % (10/17 1054)  Physical Exam:  General: alert, cooperative and no distress Lochia: appropriate Uterine Fundus: firm DVT Evaluation: No evidence of DVT seen on physical exam.   Basename 02/02/11 1405  HGB 11.2*  HCT 33.2*    Assessment/Plan: Discharge home and Contraception Essure  1. HTN - will d/c patient on HCTZ 25 mg 2. F/U - with [facility] at [facility] on Oct. 21    LOS: 2 days   Mat Carne 02/04/2011, 7:23 AM

## 2011-03-02 ENCOUNTER — Other Ambulatory Visit: Payer: Self-pay

## 2011-03-02 ENCOUNTER — Emergency Department (HOSPITAL_COMMUNITY): Payer: Medicaid Other

## 2011-03-02 ENCOUNTER — Encounter (HOSPITAL_COMMUNITY): Payer: Self-pay | Admitting: *Deleted

## 2011-03-02 ENCOUNTER — Inpatient Hospital Stay (HOSPITAL_COMMUNITY)
Admission: EM | Admit: 2011-03-02 | Discharge: 2011-03-05 | DRG: 776 | Disposition: A | Discharge status: Home / Self Care | Payer: Medicaid Other | Attending: Internal Medicine | Admitting: Internal Medicine

## 2011-03-02 DIAGNOSIS — D649 Anemia, unspecified: Secondary | ICD-10-CM

## 2011-03-02 DIAGNOSIS — R000 Tachycardia, unspecified: Secondary | ICD-10-CM

## 2011-03-02 DIAGNOSIS — N12 Tubulo-interstitial nephritis, not specified as acute or chronic: Secondary | ICD-10-CM

## 2011-03-02 DIAGNOSIS — A498 Other bacterial infections of unspecified site: Secondary | ICD-10-CM | POA: Diagnosis present

## 2011-03-02 DIAGNOSIS — O905 Postpartum thyroiditis: Secondary | ICD-10-CM

## 2011-03-02 DIAGNOSIS — O0930 Supervision of pregnancy with insufficient antenatal care, unspecified trimester: Secondary | ICD-10-CM

## 2011-03-02 DIAGNOSIS — O2390 Unspecified genitourinary tract infection in pregnancy, unspecified trimester: Principal | ICD-10-CM | POA: Diagnosis present

## 2011-03-02 DIAGNOSIS — E0501 Thyrotoxicosis with diffuse goiter with thyrotoxic crisis or storm: Secondary | ICD-10-CM | POA: Diagnosis present

## 2011-03-02 DIAGNOSIS — O09899 Supervision of other high risk pregnancies, unspecified trimester: Secondary | ICD-10-CM

## 2011-03-02 DIAGNOSIS — O09219 Supervision of pregnancy with history of pre-term labor, unspecified trimester: Secondary | ICD-10-CM

## 2011-03-02 DIAGNOSIS — O139 Gestational [pregnancy-induced] hypertension without significant proteinuria, unspecified trimester: Secondary | ICD-10-CM

## 2011-03-02 HISTORY — DX: Postpartum thyroiditis: O90.5

## 2011-03-02 LAB — COMPREHENSIVE METABOLIC PANEL
ALT: 26 U/L (ref 0–35)
Albumin: 2.9 g/dL — ABNORMAL LOW (ref 3.5–5.2)
Alkaline Phosphatase: 128 U/L — ABNORMAL HIGH (ref 39–117)
Calcium: 9.9 mg/dL (ref 8.4–10.5)
GFR calc Af Amer: >90 mL/min (ref >90)
Potassium: 3.4 mEq/L — ABNORMAL LOW (ref 3.5–5.1)
Sodium: 134 mEq/L — ABNORMAL LOW (ref 135–145)
Total Protein: 8.1 g/dL (ref 6.0–8.3)

## 2011-03-02 LAB — DIFFERENTIAL
Basophils Absolute: 0 10*3/uL (ref 0.0–0.1)
Basophils Relative: 0 % (ref 0–1)
Neutro Abs: 12.6 10*3/uL — ABNORMAL HIGH (ref 1.7–7.7)
Neutrophils Relative %: 81 % — ABNORMAL HIGH (ref 43–77)

## 2011-03-02 LAB — POCT PREGNANCY, URINE: Preg Test, Ur: NEGATIVE

## 2011-03-02 LAB — URINE MICROSCOPIC-ADD ON

## 2011-03-02 LAB — URINALYSIS, ROUTINE W REFLEX MICROSCOPIC
Glucose, UA: NEGATIVE mg/dL
Protein, ur: 100 mg/dL — AB
Urobilinogen, UA: 2 mg/dL — ABNORMAL HIGH (ref 0.0–1.0)

## 2011-03-02 LAB — PROCALCITONIN: Procalcitonin: 0.92 ng/mL

## 2011-03-02 LAB — CBC
Hemoglobin: 11.3 g/dL — ABNORMAL LOW (ref 12.0–15.0)
MCHC: 32.4 g/dL (ref 30.0–36.0)
Platelets: 245 10*3/uL (ref 150–400)
RDW: 12.5 % (ref 11.5–15.5)

## 2011-03-02 MED ORDER — SODIUM CHLORIDE 0.9 % IV BOLUS (SEPSIS)
1000.0000 mL | Freq: Once | INTRAVENOUS | Status: AC
  Administered 2011-03-02: 1000 mL via INTRAVENOUS

## 2011-03-02 MED ORDER — MORPHINE SULFATE 2 MG/ML IJ SOLN
1.0000 mg | INTRAMUSCULAR | Status: DC | PRN
  Administered 2011-03-03 (×2): 1 mg via INTRAVENOUS
  Filled 2011-03-02 (×3): qty 1

## 2011-03-02 MED ORDER — IBUPROFEN 800 MG PO TABS
800.0000 mg | ORAL_TABLET | Freq: Once | ORAL | Status: AC
  Administered 2011-03-02: 800 mg via ORAL
  Filled 2011-03-02: qty 1

## 2011-03-02 MED ORDER — OXYCODONE HCL 5 MG PO TABS
5.0000 mg | ORAL_TABLET | ORAL | Status: DC | PRN
  Administered 2011-03-02 – 2011-03-03 (×2): 5 mg via ORAL
  Filled 2011-03-02 (×3): qty 1

## 2011-03-02 MED ORDER — HYDROMORPHONE HCL PF 1 MG/ML IJ SOLN
INTRAMUSCULAR | Status: AC
  Filled 2011-03-02: qty 1

## 2011-03-02 MED ORDER — ACETAMINOPHEN 325 MG PO TABS
650.0000 mg | ORAL_TABLET | Freq: Four times a day (QID) | ORAL | Status: DC | PRN
  Administered 2011-03-03 (×2): 650 mg via ORAL
  Filled 2011-03-02 (×2): qty 2

## 2011-03-02 MED ORDER — ENOXAPARIN SODIUM 40 MG/0.4ML ~~LOC~~ SOLN
40.0000 mg | SUBCUTANEOUS | Status: DC
  Administered 2011-03-02 – 2011-03-04 (×3): 40 mg via SUBCUTANEOUS
  Filled 2011-03-02 (×3): qty 0.4

## 2011-03-02 MED ORDER — SODIUM CHLORIDE 0.9 % IV SOLN
INTRAVENOUS | Status: DC
  Administered 2011-03-02 – 2011-03-04 (×5): via INTRAVENOUS
  Administered 2011-03-04: 1000 mL via INTRAVENOUS

## 2011-03-02 MED ORDER — CIPROFLOXACIN IN D5W 400 MG/200ML IV SOLN
400.0000 mg | Freq: Once | INTRAVENOUS | Status: AC
  Administered 2011-03-02: 400 mg via INTRAVENOUS
  Filled 2011-03-02: qty 200

## 2011-03-02 MED ORDER — ACETAMINOPHEN 325 MG PO TABS
ORAL_TABLET | ORAL | Status: AC
  Administered 2011-03-02: 650 mg via ORAL
  Filled 2011-03-02: qty 2

## 2011-03-02 MED ORDER — DEXTROSE 5 % IV SOLN
INTRAVENOUS | Status: AC
  Filled 2011-03-02: qty 10

## 2011-03-02 MED ORDER — ONDANSETRON HCL 4 MG/2ML IJ SOLN
4.0000 mg | Freq: Once | INTRAMUSCULAR | Status: AC
  Administered 2011-03-02: 4 mg via INTRAVENOUS
  Filled 2011-03-02: qty 2

## 2011-03-02 MED ORDER — DEXTROSE 5 % IV SOLN
1.0000 g | INTRAVENOUS | Status: DC
  Administered 2011-03-02 – 2011-03-04 (×3): 1 g via INTRAVENOUS
  Filled 2011-03-02 (×3): qty 10

## 2011-03-02 MED ORDER — ACETAMINOPHEN 650 MG RE SUPP: 650.0000 mg | Freq: Four times a day (QID) | RECTAL | Status: DC | PRN

## 2011-03-02 MED ORDER — SODIUM CHLORIDE 0.9 % IV SOLN
Freq: Once | INTRAVENOUS | Status: AC
  Administered 2011-03-02: 15:00:00 via INTRAVENOUS

## 2011-03-02 MED ORDER — ONDANSETRON HCL 4 MG PO TABS: 4.0000 mg | ORAL_TABLET | Freq: Four times a day (QID) | ORAL | Status: DC | PRN

## 2011-03-02 MED ORDER — HYDROMORPHONE HCL 1 MG/ML IJ SOLN
1.0000 mg | Freq: Once | INTRAMUSCULAR | Status: AC
  Administered 2011-03-02: 1 mg via INTRAVENOUS
  Filled 2011-03-02: qty 1

## 2011-03-02 MED ORDER — MORPHINE SULFATE 2 MG/ML IJ SOLN
2.0000 mg | Freq: Once | INTRAMUSCULAR | Status: AC
  Administered 2011-03-02: 2 mg via INTRAVENOUS
  Filled 2011-03-02: qty 1

## 2011-03-02 MED ORDER — ALBUTEROL SULFATE HFA 108 (90 BASE) MCG/ACT IN AERS
2.0000 | INHALATION_SPRAY | Freq: Four times a day (QID) | RESPIRATORY_TRACT | Status: DC | PRN
  Administered 2011-03-02: 2 via RESPIRATORY_TRACT
  Filled 2011-03-02: qty 6.7

## 2011-03-02 MED ORDER — ACETAMINOPHEN 325 MG PO TABS
650.0000 mg | ORAL_TABLET | Freq: Once | ORAL | Status: AC
  Administered 2011-03-02: 650 mg via ORAL

## 2011-03-02 MED ORDER — ONDANSETRON HCL 4 MG/2ML IJ SOLN: 4.0000 mg | Freq: Four times a day (QID) | INTRAMUSCULAR | Status: DC | PRN

## 2011-03-02 NOTE — ED Notes (Signed)
Pt resting comfortably in bed at this time. Pt no longer has tremors. nad noted. cb at side. Will continue to monitor.

## 2011-03-02 NOTE — H&P (Signed)
PCP:   No primary provider on file.   Chief Complaint:  Suprapubic pain, bilateral flank pain, neck pain and swelling.  HPI: Patient is a pleasant 25 year old black woman who is 3 weeks postpartum, having delivered a healthy baby girl via a normal spontaneous vaginal delivery. She is not breast-feeding. She states that ever since the birth of her daughter she has been having some headaches. States that yesterday she began to feel unwell and that she started having nausea and vomiting, she felt very weak and decided to come into the hospital today for evaluation. He complains of dysuria and urinary frequency. She is found to have a UTI/pyelonephritis as well as tachycardia and an enlarged thyroid gland, and we are asked to see her for further evaluation and management.  Allergies:   Allergies  Allergen Reactions  . Shellfish Allergy       Past Medical History  Diagnosis Date  . Anemia     all 3 pregnancies  . Hyperemesis     3rd pregnancy  . Gestational hypertension 2010    with 3rd pregnancy  . Preterm labor     with 3rd pregnancy  . Asthma     albuterol inhaler, last used one year ago    Past Surgical History  Procedure Date  . Dilation and curettage of uterus 2010    Prior to Admission medications   Medication Sig Start Date End Date Taking? Authorizing Provider  albuterol (PROVENTIL HFA;VENTOLIN HFA) 108 (90 BASE) MCG/ACT inhaler Inhale 2 puffs into the lungs every 6 (six) hours as needed. Shortness of breath/asthma     Historical Provider, MD    Social History:  reports that she has never smoked. She has never used smokeless tobacco. She reports that she does not drink alcohol or use illicit drugs.  Family History  Problem Relation Age of Onset  . Hypertension Father   . Hypertension Mother   . Anemia Mother   . Anesthesia problems Neg Hx   . Hypotension Neg Hx   . Malignant hyperthermia Neg Hx   . Pseudochol deficiency Neg Hx     Review of Systems:    Constitutional: Denies diaphoresis,  HEENT: Denies photophobia, eye pain, redness, hearing loss, ear pain, congestion, sore throat, rhinorrhea, sneezing, mouth sores, trouble swallowing, neck pain, neck stiffness and tinnitus.   Respiratory: Denies SOB, DOE, cough, chest tightness,  and wheezing.   Cardiovascular: Denies chest pain, palpitations and leg swelling.  Gastrointestinal: Denies diarrhea, constipation, blood in stool and abdominal distention.  Genitourinary: Denies  hematuria,  Musculoskeletal: Denies myalgias, back pain, joint swelling, arthralgias and gait problem.  Skin: Denies pallor, rash and wound.  Neurological: Denies dizziness, seizures, syncope, weakness, light-headedness, numbness and headaches.  Hematological: Denies adenopathy. Easy bruising, personal or family bleeding history  Psychiatric/Behavioral: Denies suicidal ideation, mood changes, confusion, nervousness, sleep disturbance and agitation   Physical Exam: Blood pressure 109/52, pulse 119, temperature 98.4 F (36.9 C), temperature source Oral, resp. rate 20, height 5\' 5"  (1.651 m), weight 67.586 kg (149 lb), SpO2 100.00%. General: Alert, awake, oriented x3, in no acute distress. HEENT: Normocephalic, atraumatic, pupils equal round and reactive to light. Intact extraocular movements, dry mucous membranes. Neck: Supple no JVD no lymphadenopathy no bruits very enlarged thyroid gland that is painful to touch. Heart: Tachycardic but with regular rhythm, no murmurs, rubs or gallops. Lungs: Clear to auscultation bilaterally. Abdomen: Soft nondistended, positive bowel sounds, no masses or organomegaly noted, very tender to palpation to the suprapubic area. She  has  bilateral CVA tenderness. Extremities: No clubbing, cyanosis or edema, positive pedal pulses. Neurologic: Grossly intact and nonfocal.  Labs on Admission:  Results for orders placed during the hospital encounter of 03/02/11 (from the past 48 hour(s))   URINALYSIS, ROUTINE W REFLEX MICROSCOPIC     Status: Abnormal   Collection Time   03/02/11  9:22 AM      Component Value Range Comment   Color, Urine YELLOW  YELLOW     Appearance HAZY (*) CLEAR     Specific Gravity, Urine 1.015  1.005 - 1.030     pH 6.0  5.0 - 8.0     Glucose, UA NEGATIVE  NEGATIVE (mg/dL)    Hgb urine dipstick LARGE (*) NEGATIVE     Bilirubin Urine SMALL (*) NEGATIVE     Ketones, ur NEGATIVE  NEGATIVE (mg/dL)    Protein, ur 782 (*) NEGATIVE (mg/dL)    Urobilinogen, UA 2.0 (*) 0.0 - 1.0 (mg/dL)    Nitrite NEGATIVE  NEGATIVE     Leukocytes, UA LARGE (*) NEGATIVE    URINE MICROSCOPIC-ADD ON     Status: Abnormal   Collection Time   03/02/11  9:22 AM      Component Value Range Comment   Squamous Epithelial / LPF FEW (*) RARE     WBC, UA TOO NUMEROUS TO COUNT  <3 (WBC/hpf)    RBC / HPF TOO NUMEROUS TO COUNT  <3 (RBC/hpf)    Bacteria, UA MANY (*) RARE    POCT PREGNANCY, URINE     Status: Normal   Collection Time   03/02/11  9:32 AM      Component Value Range Comment   Preg Test, Ur NEGATIVE     CBC     Status: Abnormal   Collection Time   03/02/11  9:38 AM      Component Value Range Comment   WBC 15.6 (*) 4.0 - 10.5 (K/uL)    RBC 4.21  3.87 - 5.11 (MIL/uL)    Hemoglobin 11.3 (*) 12.0 - 15.0 (g/dL)    HCT 95.6 (*) 21.3 - 46.0 (%)    MCV 82.9  78.0 - 100.0 (fL)    MCH 26.8  26.0 - 34.0 (pg)    MCHC 32.4  30.0 - 36.0 (g/dL)    RDW 08.6  57.8 - 46.9 (%)    Platelets 245  150 - 400 (K/uL)   DIFFERENTIAL     Status: Abnormal   Collection Time   03/02/11  9:38 AM      Component Value Range Comment   Neutrophils Relative 81 (*) 43 - 77 (%)    Neutro Abs 12.6 (*) 1.7 - 7.7 (K/uL)    Lymphocytes Relative 7 (*) 12 - 46 (%)    Lymphs Abs 1.1  0.7 - 4.0 (K/uL)    Monocytes Relative 12  3 - 12 (%)    Monocytes Absolute 1.8 (*) 0.1 - 1.0 (K/uL)    Eosinophils Relative 0  0 - 5 (%)    Eosinophils Absolute 0.0  0.0 - 0.7 (K/uL)    Basophils Relative 0  0 - 1 (%)     Basophils Absolute 0.0  0.0 - 0.1 (K/uL)   COMPREHENSIVE METABOLIC PANEL     Status: Abnormal   Collection Time   03/02/11  9:38 AM      Component Value Range Comment   Sodium 134 (*) 135 - 145 (mEq/L)    Potassium 3.4 (*) 3.5 - 5.1 (mEq/L)  Chloride 99  96 - 112 (mEq/L)    CO2 23  19 - 32 (mEq/L)    Glucose, Bld 133 (*) 70 - 99 (mg/dL)    BUN 19  6 - 23 (mg/dL)    Creatinine, Ser 1.61  0.50 - 1.10 (mg/dL)    Calcium 9.9  8.4 - 10.5 (mg/dL)    Total Protein 8.1  6.0 - 8.3 (g/dL)    Albumin 2.9 (*) 3.5 - 5.2 (g/dL)    AST 24  0 - 37 (U/L)    ALT 26  0 - 35 (U/L)    Alkaline Phosphatase 128 (*) 39 - 117 (U/L)    Total Bilirubin 1.0  0.3 - 1.2 (mg/dL)    GFR calc non Af Amer >90  >90 (mL/min)    GFR calc Af Amer >90  >90 (mL/min)   CULTURE, BLOOD (ROUTINE X 2)     Status: Normal (Preliminary result)   Collection Time   03/02/11  9:55 AM      Component Value Range Comment   Specimen Description Blood      Special Requests NONE      Culture NO GROWTH <24 HRS      Report Status PENDING     CULTURE, BLOOD (ROUTINE X 2)     Status: Normal (Preliminary result)   Collection Time   03/02/11  9:55 AM      Component Value Range Comment   Specimen Description Blood      Special Requests NONE      Culture NO GROWTH <24 HRS      Report Status PENDING     LACTIC ACID, PLASMA     Status: Normal   Collection Time   03/02/11  1:49 PM      Component Value Range Comment   Lactic Acid, Venous 1.5  0.5 - 2.2 (mmol/L)   PROCALCITONIN     Status: Normal   Collection Time   03/02/11  1:50 PM      Component Value Range Comment   Procalcitonin 0.92       Radiological Exams on Admission: Dg Chest Port 1 View  03/02/2011  *RADIOLOGY REPORT*  Clinical Data: 25 year old female with irregular heart rate and tachycardia.  PORTABLE CHEST - 1 VIEW  Comparison: None  Findings: The cardiomediastinal silhouette is unremarkable. The lungs are clear. There is no evidence of focal airspace disease,  pulmonary edema, pulmonary nodule/mass, pleural effusion, or pneumothorax. No acute bony abnormalities are identified.  IMPRESSION: No evidence of active cardiopulmonary disease.  Original Report Authenticated By: Rosendo Gros, M.D.    Assessment/Plan Principal Problem:  *Pyelonephritis Active Problems:  Postpartum thyroiditis  Tachycardia  #1 pyelonephritis: We'll change Cipro over to Rocephin pending culture data. Patient has been pancultured. We'll continue to give copious amounts of IV fluids given she appears dehydrated. She has received 4 L of normal saline in the emergency department.  #2 postpartum thyroiditis: She has a very enlarged and tender thyroid gland. TSH, free T4 and free T3 have been ordered. Postpartum thyroiditis usually has a hyper thyroid phase followed by a hypothyroid phase and then usually selling out as euthyroidism, so usually treatment is not recommended unless patient is overly symptomatic in which case something like propranolol or atenolol could be attempted if she is exhibiting signs of hyperthyroidism. Regardless, she will need repeat thyroid function tests in 4-6 weeks.  #3 sinus tachycardia: This could be do to many things including her fever, dehydration, active pyelonephritis and also  possibly hyperthyroidism from her postpartum thyroiditis. At this point I agree with IV fluids and symptomatic treatment.  Time Spent on Admission: Approximately 60 minutes.  HERNANDEZ ACOSTA,Berenice Oehlert 03/02/2011, 5:09 PM

## 2011-03-02 NOTE — ED Notes (Signed)
Rounding completed. Pt lying in bed shivering and c/o abd pain that radiates to back. Rectal temp obtained. 102.4 Hr still elevated in 120's. Dr. Colon Branch aware.

## 2011-03-02 NOTE — ED Notes (Signed)
Per Dr. Colon Branch pt able to have meal

## 2011-03-02 NOTE — ED Notes (Signed)
Swelling noted to neck. Dr. Colon Branch aware of swelling. Pt denies difficulty breathing or swallowing

## 2011-03-02 NOTE — ED Notes (Signed)
Report attempted to give to [facility] on 300. Unable to take report at this time.

## 2011-03-02 NOTE — ED Provider Notes (Signed)
History  Scribed for [employer]. Colon Branch, MD, the patient was seen in [facility]. The chart was scribed by Gilman Schmidt. The patients care was started at 9:32 AM.   CSN: [id number] Arrival date & time: 03/02/2011  8:51 AM   First MD Initiated Contact with Patient 03/02/11 0914      Chief Complaint  Patient presents with  . Dysuria  . Emesis   HPI [name] is a 25 y.o. female who presents to the Emergency Department complaining of dysuria onset three days. Pt also notes c/o frequent urination, difficulty urinating, and blood in urine. Pt also states she has been vomiting for 2 days and has had migraine, fever, chills, abdominal pain, and back pain.  Pt states she has a UTI. Pt is post partum three months. States she delivered full term baby vaginally and has not yet had post natal check. Also notes swelling in ankles. Pt is on BP meds for Preeclampsia  Pt is not breastfeeding. There are no other associated symptoms and no other alleviating or aggravating factors.    OB: [facility]    Past Medical History  Diagnosis Date  . Anemia     all 3 pregnancies  . Hyperemesis     3rd pregnancy  . Gestational hypertension 2010    with 3rd pregnancy  . Preterm labor     with 3rd pregnancy  . Asthma     albuterol inhaler, last used one year ago    Past Surgical History  Procedure Date  . Dilation and curettage of uterus 2010    Family History  Problem Relation Age of Onset  . Hypertension Father   . Hypertension Mother   . Anemia Mother   . Anesthesia problems Neg Hx   . Hypotension Neg Hx   . Malignant hyperthermia Neg Hx   . Pseudochol deficiency Neg Hx     History  Substance Use Topics  . Smoking status: Never Smoker   . Smokeless tobacco: Never Used  . Alcohol Use: No    OB History    Grav Para Term Preterm Abortions TAB SAB Ect Mult Living   5 4 3 1 1  1   4       Review of Systems  Constitutional: Positive for fever and chills.    Gastrointestinal: Positive for nausea, vomiting and abdominal pain.  Genitourinary: Positive for dysuria, frequency, hematuria and difficulty urinating.  Musculoskeletal: Positive for back pain.  Neurological: Positive for headaches.  All other systems reviewed and are negative.    Allergies  Shellfish allergy  Home Medications   Current Outpatient Rx  Name Route Sig Dispense Refill  . ALBUTEROL SULFATE HFA 108 (90 BASE) MCG/ACT IN AERS Inhalation Inhale 2 puffs into the lungs every 6 (six) hours as needed. Shortness of breath/asthma     . CALCIUM CARBONATE ANTACID 750 MG PO CHEW Oral Chew 1 tablet by mouth 2 (two) times daily.      Marland Kitchen HYDROCHLOROTHIAZIDE 25 MG PO TABS Oral Take 1 tablet (25 mg total) by mouth daily. 30 tablet 0    BP 108/84  Pulse 144  Temp(Src) 100.1 F (37.8 C) (Oral)  Resp 18  Ht 5\' 5"  (1.651 m)  Wt 149 lb (67.586 kg)  BMI 24.79 kg/m2  SpO2 98%  Physical Exam  Constitutional: She is oriented to person, place, and time. She appears well-developed and well-nourished.  Non-toxic appearance. She does not have a sickly appearance.  HENT:  Head:  Normocephalic and atraumatic.  Mouth/Throat: Oropharynx is clear and moist.       Lips dry Mucous membrane moist   Eyes: Conjunctivae, EOM and lids are normal. Pupils are equal, round, and reactive to light. No scleral icterus.  Neck: Trachea normal and normal range of motion. Neck supple.       Diffusely swollen thyroid gland that is mildly tender  Cardiovascular: Regular rhythm and normal heart sounds.  Tachycardia present.   Pulmonary/Chest: Effort normal and breath sounds normal.  Abdominal: Soft. Normal appearance. There is no tenderness. There is no rebound, no guarding and no CVA tenderness.       bilateral flank pain    Musculoskeletal: Normal range of motion.  Neurological: She is alert and oriented to person, place, and time. She has normal strength.  Skin: Skin is warm, dry and intact. No rash noted.     ED Course  Procedures  DIAGNOSTIC STUDIES: Oxygen Saturation is 98% on room air, normal by my interpretation.    COORDINATION OF CARE: 8:51am: -Patient evaluated by ED physician, CMP, T4, CBC, Diff, Blood Culture, Pregnancy, UA ordered 11:25:am: After 1L fluid pt is still tachycardic. Another liter given.  1:33pm: Recheck by EDP. Pt states sx are unresolved. Plan for admit discussed.   LABS: Results for orders placed during the hospital encounter of 03/02/11  URINALYSIS, ROUTINE W REFLEX MICROSCOPIC      Component Value Range   Color, Urine YELLOW  YELLOW    Appearance HAZY (*) CLEAR    Specific Gravity, Urine 1.015  1.005 - 1.030    pH 6.0  5.0 - 8.0    Glucose, UA NEGATIVE  NEGATIVE (mg/dL)   Hgb urine dipstick LARGE (*) NEGATIVE    Bilirubin Urine SMALL (*) NEGATIVE    Ketones, ur NEGATIVE  NEGATIVE (mg/dL)   Protein, ur 161 (*) NEGATIVE (mg/dL)   Urobilinogen, UA 2.0 (*) 0.0 - 1.0 (mg/dL)   Nitrite NEGATIVE  NEGATIVE    Leukocytes, UA LARGE (*) NEGATIVE   POCT PREGNANCY, URINE      Component Value Range   Preg Test, Ur NEGATIVE    CBC      Component Value Range   WBC 15.6 (*) 4.0 - 10.5 (K/uL)   RBC 4.21  3.87 - 5.11 (MIL/uL)   Hemoglobin 11.3 (*) 12.0 - 15.0 (g/dL)   HCT 09.6 (*) 04.5 - 46.0 (%)   MCV 82.9  78.0 - 100.0 (fL)   MCH 26.8  26.0 - 34.0 (pg)   MCHC 32.4  30.0 - 36.0 (g/dL)   RDW 40.9  81.1 - 91.4 (%)   Platelets 245  150 - 400 (K/uL)  DIFFERENTIAL      Component Value Range   Neutrophils Relative 81 (*) 43 - 77 (%)   Neutro Abs 12.6 (*) 1.7 - 7.7 (K/uL)   Lymphocytes Relative 7 (*) 12 - 46 (%)   Lymphs Abs 1.1  0.7 - 4.0 (K/uL)   Monocytes Relative 12  3 - 12 (%)   Monocytes Absolute 1.8 (*) 0.1 - 1.0 (K/uL)   Eosinophils Relative 0  0 - 5 (%)   Eosinophils Absolute 0.0  0.0 - 0.7 (K/uL)   Basophils Relative 0  0 - 1 (%)   Basophils Absolute 0.0  0.0 - 0.1 (K/uL)  CULTURE, BLOOD (ROUTINE X 2)      Component Value Range   Specimen  Description Blood     Special Requests NONE     Culture NO GROWTH <  24 HRS     Report Status PENDING    CULTURE, BLOOD (ROUTINE X 2)      Component Value Range   Specimen Description Blood     Special Requests NONE     Culture NO GROWTH <24 HRS     Report Status PENDING    COMPREHENSIVE METABOLIC PANEL      Component Value Range   Sodium 134 (*) 135 - 145 (mEq/L)   Potassium 3.4 (*) 3.5 - 5.1 (mEq/L)   Chloride 99  96 - 112 (mEq/L)   CO2 23  19 - 32 (mEq/L)   Glucose, Bld 133 (*) 70 - 99 (mg/dL)   BUN 19  6 - 23 (mg/dL)   Creatinine, Ser 1.61  0.50 - 1.10 (mg/dL)   Calcium 9.9  8.4 - 09.6 (mg/dL)   Total Protein 8.1  6.0 - 8.3 (g/dL)   Albumin 2.9 (*) 3.5 - 5.2 (g/dL)   AST 24  0 - 37 (U/L)   ALT 26  0 - 35 (U/L)   Alkaline Phosphatase 128 (*) 39 - 117 (U/L)   Total Bilirubin 1.0  0.3 - 1.2 (mg/dL)   GFR calc non Af Amer >90  >90 (mL/min)   GFR calc Af Amer >90  >90 (mL/min)  URINE MICROSCOPIC-ADD ON      Component Value Range   Squamous Epithelial / LPF FEW (*) RARE    WBC, UA TOO NUMEROUS TO COUNT  <3 (WBC/hpf)   RBC / HPF TOO NUMEROUS TO COUNT  <3 (RBC/hpf)   Bacteria, UA MANY (*) RARE   LACTIC ACID, PLASMA      Component Value Range   Lactic Acid, Venous 1.5  0.5 - 2.2 (mmol/L)  PROCALCITONIN      Component Value Range   Procalcitonin 0.92        MDM  Patient who is 3 weeks postpartum here with dysuria, bilateral flak pain and diffuse mild abdominal pain. Labs indicate a UTI, PE c/w pyelonephritis. Incidental is the finding of a diffusely enlarged and tender thyroid. She has been in the ER most of the day receiving IVF, antibiotics, and being monitored. She has remained tachycardic despite fluids. Fever has waxed and waned. She has received both tylenol and ibuprofen. BP has remained stable. Spoke with Dr. Ardyth Harps, hospitalist who has accepted patient for admission to the hospital. Patient informed of clinical course, understand medical decision-making process, and  agree with plan.The patient appears reasonably stabilized for admission considering the current resources, flow, and capabilities available in the ED at this time, and I doubt any other [facility] requiring further screening and/or treatment in the ED prior to admission.  CRITICAL CARE Performed by: Annamarie Dawley.   Total critical care time: 65  Critical care time was exclusive of separately billable procedures and treating other patients.  Critical care was necessary to treat or prevent imminent or life-threatening deterioration.  Critical care was time spent personally by me on the following activities: development of treatment plan with patient and/or surrogate as well as nursing, discussions with consultants, evaluation of patient's response to treatment, examination of patient, obtaining history from patient or surrogate, ordering and performing treatments and interventions, ordering and review of laboratory studies, ordering and review of radiographic studies, pulse oximetry and re-evaluation of patient's condition.  I personally performed the services described in this documentation, which was scribed in my presence. The recorded information has been reviewed and considered.       Nicoletta Dress. Colon Branch, MD 03/02/11  [id number] 

## 2011-03-02 NOTE — ED Notes (Signed)
Pt asked if voided since arrived. Pt has voided within last hour per pt.

## 2011-03-02 NOTE — ED Notes (Signed)
VS rechecked while in room. HR 140-150's while pt lying in bed. Pt trembling. Pt c/o pain that radiates from side to side in back.  Pt a/o x3. Dr. Colon Branch made aware of pt status and concern for pt.

## 2011-03-02 NOTE — ED Notes (Signed)
Pt resting in bed with no complaints. Hr still elevated at 122. New order received for additional fluid bolus. Will continue to monitor. nad noted. resp even/nonlabored. Skin warm/dry.

## 2011-03-02 NOTE — ED Notes (Signed)
Pt c/o difficulty urinating and burning with urination. Pt states she has been vomiting for 2 days and has had a migraine. Pt states she has a uti

## 2011-03-03 ENCOUNTER — Inpatient Hospital Stay (HOSPITAL_COMMUNITY): Payer: Medicaid Other

## 2011-03-03 LAB — CBC
MCH: 27.8 pg (ref 26.0–34.0)
MCV: 84.7 fL (ref 78.0–100.0)
Platelets: 209 10*3/uL (ref 150–400)
RDW: 13 % (ref 11.5–15.5)
WBC: 12.2 10*3/uL — ABNORMAL HIGH (ref 4.0–10.5)

## 2011-03-03 LAB — BASIC METABOLIC PANEL
Calcium: 9.4 mg/dL (ref 8.4–10.5)
Chloride: 106 mEq/L (ref 96–112)
Creatinine, Ser: 0.78 mg/dL (ref 0.50–1.10)
GFR calc Af Amer: >90 mL/min (ref >90)

## 2011-03-03 LAB — T3, FREE: T3, Free: >20 pg/mL — ABNORMAL HIGH (ref 2.3–4.2)

## 2011-03-03 MED ORDER — HYDROMORPHONE HCL PF 1 MG/ML IJ SOLN
0.5000 mg | INTRAMUSCULAR | Status: DC | PRN
  Administered 2011-03-03 – 2011-03-04 (×6): 0.5 mg via INTRAVENOUS
  Filled 2011-03-03 (×6): qty 1

## 2011-03-03 MED ORDER — SODIUM IODIDE I 131 CAPSULE
10.0000 | Freq: Once | INTRAVENOUS | Status: AC | PRN
  Administered 2011-03-03: 7 via ORAL

## 2011-03-03 MED ORDER — PROPRANOLOL HCL 20 MG PO TABS
20.0000 mg | ORAL_TABLET | Freq: Three times a day (TID) | ORAL | Status: DC
  Administered 2011-03-03: 20 mg via ORAL
  Filled 2011-03-03: qty 1

## 2011-03-03 MED ORDER — ACETAMINOPHEN 325 MG PO TABS
650.0000 mg | ORAL_TABLET | ORAL | Status: DC | PRN
  Administered 2011-03-03 – 2011-03-04 (×3): 650 mg via ORAL
  Filled 2011-03-03 (×3): qty 2

## 2011-03-03 MED ORDER — PROPRANOLOL HCL 20 MG PO TABS
10.0000 mg | ORAL_TABLET | Freq: Three times a day (TID) | ORAL | Status: DC
  Administered 2011-03-03: 10 mg via ORAL
  Filled 2011-03-03: qty 1

## 2011-03-03 MED ORDER — IBUPROFEN 800 MG PO TABS: 400.0000 mg | ORAL_TABLET | Freq: Four times a day (QID) | ORAL | Status: DC | PRN

## 2011-03-03 MED ORDER — PROPRANOLOL HCL 20 MG PO TABS
40.0000 mg | ORAL_TABLET | Freq: Three times a day (TID) | ORAL | Status: DC
  Administered 2011-03-03: 40 mg via ORAL
  Filled 2011-03-03 (×2): qty 2

## 2011-03-03 MED ORDER — PREDNISONE 10 MG PO TABS
10.0000 mg | ORAL_TABLET | Freq: Every day | ORAL | Status: DC
  Administered 2011-03-04 – 2011-03-05 (×2): 10 mg via ORAL
  Filled 2011-03-03 (×2): qty 1

## 2011-03-03 MED ORDER — PROPRANOLOL HCL 1 MG/ML IV SOLN
1.0000 mg | Freq: Once | INTRAVENOUS | Status: AC
  Administered 2011-03-03: 1 mg via INTRAVENOUS
  Filled 2011-03-03: qty 1

## 2011-03-03 MED ORDER — ACETAMINOPHEN 10 MG/ML IV SOLN
1000.0000 mg | Freq: Once | INTRAVENOUS | Status: AC
  Administered 2011-03-03: 1000 mg via INTRAVENOUS
  Filled 2011-03-03: qty 100

## 2011-03-03 MED ORDER — DEXTROSE 5 % IV SOLN
500.0000 mg | INTRAVENOUS | Status: DC
  Administered 2011-03-03 – 2011-03-04 (×2): 500 mg via INTRAVENOUS
  Filled 2011-03-03 (×2): qty 500

## 2011-03-03 NOTE — Progress Notes (Signed)
Subjective: This lady has migraine type headaches and also a resting tachycardia. She has discomfort in her neck secondary to thyroiditis. Her free T4 is elevated at 6.93. TSH is pending. She is postpartum.           Physical Exam: Blood pressure 129/78, pulse 142, temperature 98.3 F (36.8 C), temperature source Oral, resp. rate 22, height 5\' 5"  (1.651 m), weight 67.586 kg (149 lb), SpO2 96.00%, not currently breastfeeding. She does look systemically well and is not clinically toxic or septic. She does have a resting tachycardia. Heart sounds are present and normal. Lung fields are clear. She does have a goiter which is tender. She is alert and orientated. She does not appear to have exophthalmos nor does she have a resting tremor with the outstretched hand.   Investigations: Results for orders placed during the hospital encounter of 03/02/11 (from the past 48 hour(s))  URINALYSIS, ROUTINE W REFLEX MICROSCOPIC     Status: Abnormal   Collection Time   03/02/11  9:22 AM      Component Value Range Comment   Color, Urine YELLOW  YELLOW     Appearance HAZY (*) CLEAR     Specific Gravity, Urine 1.015  1.005 - 1.030     pH 6.0  5.0 - 8.0     Glucose, UA NEGATIVE  NEGATIVE (mg/dL)    Hgb urine dipstick LARGE (*) NEGATIVE     Bilirubin Urine SMALL (*) NEGATIVE     Ketones, ur NEGATIVE  NEGATIVE (mg/dL)    Protein, ur 161 (*) NEGATIVE (mg/dL)    Urobilinogen, UA 2.0 (*) 0.0 - 1.0 (mg/dL)    Nitrite NEGATIVE  NEGATIVE     Leukocytes, UA LARGE (*) NEGATIVE    URINE MICROSCOPIC-ADD ON     Status: Abnormal   Collection Time   03/02/11  9:22 AM      Component Value Range Comment   Squamous Epithelial / LPF FEW (*) RARE     WBC, UA TOO NUMEROUS TO COUNT  <3 (WBC/hpf)    RBC / HPF TOO NUMEROUS TO COUNT  <3 (RBC/hpf)    Bacteria, UA MANY (*) RARE    POCT PREGNANCY, URINE     Status: Normal   Collection Time   03/02/11  9:32 AM      Component Value Range Comment   Preg Test, Ur NEGATIVE      CBC     Status: Abnormal   Collection Time   03/02/11  9:38 AM      Component Value Range Comment   WBC 15.6 (*) 4.0 - 10.5 (K/uL)    RBC 4.21  3.87 - 5.11 (MIL/uL)    Hemoglobin 11.3 (*) 12.0 - 15.0 (g/dL)    HCT 09.6 (*) 04.5 - 46.0 (%)    MCV 82.9  78.0 - 100.0 (fL)    MCH 26.8  26.0 - 34.0 (pg)    MCHC 32.4  30.0 - 36.0 (g/dL)    RDW 40.9  81.1 - 91.4 (%)    Platelets 245  150 - 400 (K/uL)   DIFFERENTIAL     Status: Abnormal   Collection Time   03/02/11  9:38 AM      Component Value Range Comment   Neutrophils Relative 81 (*) 43 - 77 (%)    Neutro Abs 12.6 (*) 1.7 - 7.7 (K/uL)    Lymphocytes Relative 7 (*) 12 - 46 (%)    Lymphs Abs 1.1  0.7 - 4.0 (K/uL)  Monocytes Relative 12  3 - 12 (%)    Monocytes Absolute 1.8 (*) 0.1 - 1.0 (K/uL)    Eosinophils Relative 0  0 - 5 (%)    Eosinophils Absolute 0.0  0.0 - 0.7 (K/uL)    Basophils Relative 0  0 - 1 (%)    Basophils Absolute 0.0  0.0 - 0.1 (K/uL)   COMPREHENSIVE METABOLIC PANEL     Status: Abnormal   Collection Time   03/02/11  9:38 AM      Component Value Range Comment   Sodium 134 (*) 135 - 145 (mEq/L)    Potassium 3.4 (*) 3.5 - 5.1 (mEq/L)    Chloride 99  96 - 112 (mEq/L)    CO2 23  19 - 32 (mEq/L)    Glucose, Bld 133 (*) 70 - 99 (mg/dL)    BUN 19  6 - 23 (mg/dL)    Creatinine, Ser 1.61  0.50 - 1.10 (mg/dL)    Calcium 9.9  8.4 - 10.5 (mg/dL)    Total Protein 8.1  6.0 - 8.3 (g/dL)    Albumin 2.9 (*) 3.5 - 5.2 (g/dL)    AST 24  0 - 37 (U/L)    ALT 26  0 - 35 (U/L)    Alkaline Phosphatase 128 (*) 39 - 117 (U/L)    Total Bilirubin 1.0  0.3 - 1.2 (mg/dL)    GFR calc non Af Amer >90  >90 (mL/min)    GFR calc Af Amer >90  >90 (mL/min)   T4, FREE     Status: Abnormal   Collection Time   03/02/11  9:38 AM      Component Value Range Comment   Free T4 6.93 (*) 0.80 - 1.80 (ng/dL)   CULTURE, BLOOD (ROUTINE X 2)     Status: Normal (Preliminary result)   Collection Time   03/02/11  9:55 AM      Component Value Range  Comment   Specimen Description Blood      Special Requests NONE      Culture NO GROWTH <24 HRS      Report Status PENDING     CULTURE, BLOOD (ROUTINE X 2)     Status: Normal (Preliminary result)   Collection Time   03/02/11  9:55 AM      Component Value Range Comment   Specimen Description Blood      Special Requests NONE      Culture NO GROWTH <24 HRS      Report Status PENDING     LACTIC ACID, PLASMA     Status: Normal   Collection Time   03/02/11  1:49 PM      Component Value Range Comment   Lactic Acid, Venous 1.5  0.5 - 2.2 (mmol/L)   PROCALCITONIN     Status: Normal   Collection Time   03/02/11  1:50 PM      Component Value Range Comment   Procalcitonin 0.92     CBC     Status: Abnormal   Collection Time   03/03/11  5:25 AM      Component Value Range Comment   WBC 12.2 (*) 4.0 - 10.5 (K/uL)    RBC 3.52 (*) 3.87 - 5.11 (MIL/uL)    Hemoglobin 9.8 (*) 12.0 - 15.0 (g/dL)    HCT 09.6 (*) 04.5 - 46.0 (%)    MCV 84.7  78.0 - 100.0 (fL)    MCH 27.8  26.0 - 34.0 (pg)    MCHC  32.9  30.0 - 36.0 (g/dL)    RDW 09.8  11.9 - 14.7 (%)    Platelets 209  150 - 400 (K/uL)   BASIC METABOLIC PANEL     Status: Normal   Collection Time   03/03/11  5:25 AM      Component Value Range Comment   Sodium 137  135 - 145 (mEq/L)    Potassium 3.7  3.5 - 5.1 (mEq/L)    Chloride 106  96 - 112 (mEq/L)    CO2 21  19 - 32 (mEq/L)    Glucose, Bld 89  70 - 99 (mg/dL)    BUN 18  6 - 23 (mg/dL)    Creatinine, Ser 8.29  0.50 - 1.10 (mg/dL)    Calcium 9.4  8.4 - 10.5 (mg/dL)    GFR calc non Af Amer >90  >90 (mL/min)    GFR calc Af Amer >90  >90 (mL/min)   CULTURE, BLOOD (ROUTINE X 2)     Status: Normal (Preliminary result)   Collection Time   03/03/11  5:30 AM      Component Value Range Comment   Specimen Description Peripheral RIGHT ARM      Special Requests Normal 6CC      Culture PENDING      Report Status PENDING     CULTURE, BLOOD (ROUTINE X 2)     Status: Normal (Preliminary result)    Collection Time   03/03/11  5:45 AM      Component Value Range Comment   Specimen Description Peripheral RIGHT ARM      Special Requests Normal 6CC      Culture PENDING      Report Status PENDING      Recent Results (from the past 240 hour(s))  CULTURE, BLOOD (ROUTINE X 2)     Status: Normal (Preliminary result)   Collection Time   03/02/11  9:55 AM      Component Value Range Status Comment   Specimen Description Blood   Final    Special Requests NONE   Final    Culture NO GROWTH <24 HRS   Final    Report Status PENDING   Incomplete   CULTURE, BLOOD (ROUTINE X 2)     Status: Normal (Preliminary result)   Collection Time   03/02/11  9:55 AM      Component Value Range Status Comment   Specimen Description Blood   Final    Special Requests NONE   Final    Culture NO GROWTH <24 HRS   Final    Report Status PENDING   Incomplete   CULTURE, BLOOD (ROUTINE X 2)     Status: Normal (Preliminary result)   Collection Time   03/03/11  5:30 AM      Component Value Range Status Comment   Specimen Description Peripheral RIGHT ARM   Final    Special Requests Normal 6CC   Final    Culture PENDING   Incomplete    Report Status PENDING   Incomplete   CULTURE, BLOOD (ROUTINE X 2)     Status: Normal (Preliminary result)   Collection Time   03/03/11  5:45 AM      Component Value Range Status Comment   Specimen Description Peripheral RIGHT ARM   Final    Special Requests Normal [facility]   Final    Culture PENDING   Incomplete    Report Status PENDING   Incomplete     Dg Chest Port 1  View  03/02/2011  *RADIOLOGY REPORT*  Clinical Data: 25 year old female with irregular heart rate and tachycardia.  PORTABLE CHEST - 1 VIEW  Comparison: None  Findings: The cardiomediastinal silhouette is unremarkable. The lungs are clear. There is no evidence of focal airspace disease, pulmonary edema, pulmonary nodule/mass, pleural effusion, or pneumothorax. No acute bony abnormalities are identified.  IMPRESSION: No  evidence of active cardiopulmonary disease.  Original Report Authenticated By: Rosendo Gros, M.D.      Medications: I have reviewed the patient's current medications.  Impression: 1. Pyelonephritis. 2. Postpartum thyroiditis.     Plan: 1. Start propranolol 10 mg 3 times a day. 2. Endocrinology consultation. 3. Continue intravenous antibiotics for pyelonephritis.     LOS: 1 day   GOSRANI,NIMISH C 03/03/2011, 8:34 AM

## 2011-03-03 NOTE — Consult Note (Signed)
[name], [name]              ACCOUNT NO.:  [id number]  MEDICAL RECORD NO.:  [id number]  LOCATION:  A321                          FACILITY:  [facility]  PHYSICIAN:  Purcell Nails, MD DATE OF BIRTH:  15-Jul-1985  DATE OF CONSULTATION:  03/03/2011 DATE OF DISCHARGE:                                CONSULTATION   REASON FOR CONSULT:  Postpartum thyroiditis.  HISTORY OF PRESENT ILLNESS:  This is a 25 year old female patient with medical history significant for recent normal vaginal delivery on February 02, 2011.  Her medical history also includes anemia, 3 prior pregnancies, hyperemesis in the third pregnancy, gestational hypertension with the third pregnancy as well as preterm labor, and asthma.  She is currently in-house since yesterday, March 02, 2011, after she complained of suprapubic pain, bilateral flank pain, and neck swelling.  She was found to have UTI/pyelonephritis.  She is on antibiotics currently.  She was also found to have significantly elevated free T4, which trigged this endocrine consultation.  On further interview, the patient reports that she has been dealing with some on and off palpitations, getting worse in the more recent weeks, some un- quantified weight loss, palpitations, perspiration, and tremors.  She denies any family history of thyroid disease.  Denies any exposure to thyroid hormone.  She also noticed that her thyroid gland was enlarged only for the last 4 days.  Her free T4 was elevated at 26.93 consistent with thyrotoxicosis, etiology unclear at this point.  PAST MEDICAL HISTORY:  As above.  PAST SURGICAL HISTORY:  Dilation and curettage of the uterus in 2010.  HOME MEDICATIONS:  Prior to hospitalization include albuterol.  In- house, she is currently on acetaminophen, albuterol, ondansetron, oxycodone, normal saline, ceftriaxone, ciprofloxacin, enoxaparin, ibuprofen.  She is allergic to [facility].  SOCIAL HISTORY:  Negative for smoking,  alcohol, or drug use.  FAMILY HISTORY:  Significant for hypertension and anemia in her father and mother respectively.  REVIEW OF SYSTEMS:  As in HPI.  All other systems have been reviewed and negative.  PHYSICAL EXAMINATION:  GENERAL:  She is sick looking, in her hospital bed. VITAL SIGNS:  Blood pressure 129/78, pulse rate 142, temperature 101.4. GENERAL:  She is oriented x3.  Has some distress from her high fever and chills. HEENT:  She has big eyes, however, no clear exophthalmos. NECK:  Significant for significantly enlarged thyroid, up to 3 times normal with significant bruit and tender all over. CHEST:  Clear to auscultation. CARDIOVASCULAR:  Tachycardic.  No murmur.  No gallop. ABDOMEN:  Soft. EXTREMITIES:  No edema. CNS:  She has tremors and brisk deep tendon reflexes. SKIN:  Has no rash, no hyperemia, however, warm to touch.  Her most recent lab work shows sodium 137, potassium 3.7, chloride 106, bicarb 21, BUN 18, her blood glucose is 89.  Her free T4 was found to be 6.93, significantly elevated for normal free T4 range of 0.8-1.8 ng/dL.  ASSESSMENT AND PLAN:  Thyrotoxicosis.  Etiology unclear at this point, possibilities of postpartum thyroiditis with transient thyrotoxicosis versus an underlying autoimmune thyroid disease with hyperthyroidism. This will need to be worked up further.  However, at this point, the patient is symptomatic for which  I will proceed to give her propranolol 1 mg IV 1 dose to be followed by 40 mg p.o. every 8 hours.  I will proceed to give her prednisone 10 mg every day for the next 10 days to help with the neck pain, possibly from thyroiditis.  I will proceed to obtain thyroid uptake and scan to differentiate whether this is underlying primary hyperparathyroidism or thyroiditis with a transient thyrotoxicosis.  I will also obtain a TSH, free T3, along with total T4 to complete the TFTs workup.  Further management will depend on  the findings.  If it is postpartum thyroiditis as it was rightly pointed out by Dr. Ardyth Harps, it may follow its own course characterized by thyrotoxicosis, which usually resolves or sometimes interrupt with frank hypothyroidism.  In any case, she will need to have symptomatic management as described above.  I will continue to follow her in-house with you while she is being treated for pyelonephritis.  No specific antithyroid measures until workup is complete.  Dear Dr. Karilyn Cota, thank you for the opportunity to participate in the care of this pleasant patient.  I will update you on her progress.          ______________________________ Purcell Nails, MD     GN/MEDQ  D:  03/03/2011  T:  03/03/2011  Job:  [id number]

## 2011-03-03 NOTE — Progress Notes (Signed)
UR Chart Review Completed  

## 2011-03-03 NOTE — Consult Note (Signed)
  [id number] 

## 2011-03-03 NOTE — Progress Notes (Signed)
Given tylenol 650 mg PO for temp 103.2 (oral), blood cultures ordered

## 2011-03-04 ENCOUNTER — Inpatient Hospital Stay (HOSPITAL_COMMUNITY): Payer: Medicaid Other

## 2011-03-04 LAB — COMPREHENSIVE METABOLIC PANEL
AST: 43 U/L — ABNORMAL HIGH (ref 0–37)
Albumin: 2 g/dL — ABNORMAL LOW (ref 3.5–5.2)
Alkaline Phosphatase: 129 U/L — ABNORMAL HIGH (ref 39–117)
Chloride: 106 mEq/L (ref 96–112)
Potassium: 3.1 mEq/L — ABNORMAL LOW (ref 3.5–5.1)
Total Bilirubin: 2 mg/dL — ABNORMAL HIGH (ref 0.3–1.2)

## 2011-03-04 LAB — URINE CULTURE

## 2011-03-04 LAB — CBC
Platelets: 214 10*3/uL (ref 150–400)
RDW: 13.3 % (ref 11.5–15.5)
WBC: 11.7 10*3/uL — ABNORMAL HIGH (ref 4.0–10.5)

## 2011-03-04 LAB — T3: T3, Total: 555.4 ng/dl — ABNORMAL HIGH (ref 80.0–204.0)

## 2011-03-04 MED ORDER — PROPRANOLOL HCL 20 MG PO TABS
40.0000 mg | ORAL_TABLET | Freq: Three times a day (TID) | ORAL | Status: DC
  Administered 2011-03-04 – 2011-03-05 (×4): 40 mg via ORAL
  Filled 2011-03-04 (×4): qty 2

## 2011-03-04 MED ORDER — POTASSIUM CHLORIDE CRYS ER 20 MEQ PO TBCR
40.0000 meq | EXTENDED_RELEASE_TABLET | Freq: Once | ORAL | Status: AC
  Administered 2011-03-04: 40 meq via ORAL
  Filled 2011-03-04: qty 2

## 2011-03-04 MED ORDER — SODIUM PERTECHNETATE TC 99M INJECTION
10.0000 | Freq: Once | INTRAVENOUS | Status: AC | PRN
  Administered 2011-03-04: 10.3 via INTRAVENOUS

## 2011-03-04 MED ORDER — SODIUM CHLORIDE 0.9 % IJ SOLN
INTRAMUSCULAR | Status: AC
  Administered 2011-03-04: 10 mL
  Filled 2011-03-04: qty 3

## 2011-03-04 NOTE — Progress Notes (Signed)
Subjective: This lady does not feel any better today. She also spiked another fever. She was seen by endocrinology, Dr. Fransico Him, who is ordered a thyroid scan which she will have today. Her urine is growing Escherichia coli.           Physical Exam: Blood pressure 123/77, pulse 117, temperature 101 F (38.3 C), temperature source Oral, resp. rate 16, height 5\' 5"  (1.651 m), weight 67.586 kg (149 lb), SpO2 94.00%, not currently breastfeeding. She does look systemically well and is not clinically toxic or septic. She does have a resting tachycardia. Heart sounds are present and normal. Lung fields are clear. She does have a goiter which is tender. She is alert and orientated. She does not appear to have exophthalmos nor does she have a resting tremor with the outstretched hand.   Investigations: Results for orders placed during the hospital encounter of 03/02/11 (from the past 48 hour(s))  URINE CULTURE     Status: Normal (Preliminary result)   Collection Time   03/02/11  9:00 AM      Component Value Range Comment   Specimen Description URINE, CLEAN CATCH      Special Requests NONE      Setup Time [id number]      Colony Count >=100,000 COLONIES/ML      Culture ESCHERICHIA COLI      Report Status PENDING     URINALYSIS, ROUTINE W REFLEX MICROSCOPIC     Status: Abnormal   Collection Time   03/02/11  9:22 AM      Component Value Range Comment   Color, Urine YELLOW  YELLOW     Appearance HAZY (*) CLEAR     Specific Gravity, Urine 1.015  1.005 - 1.030     pH 6.0  5.0 - 8.0     Glucose, UA NEGATIVE  NEGATIVE (mg/dL)    Hgb urine dipstick LARGE (*) NEGATIVE     Bilirubin Urine SMALL (*) NEGATIVE     Ketones, ur NEGATIVE  NEGATIVE (mg/dL)    Protein, ur 811 (*) NEGATIVE (mg/dL)    Urobilinogen, UA 2.0 (*) 0.0 - 1.0 (mg/dL)    Nitrite NEGATIVE  NEGATIVE     Leukocytes, UA LARGE (*) NEGATIVE    URINE MICROSCOPIC-ADD ON     Status: Abnormal   Collection Time   03/02/11  9:22 AM    Component Value Range Comment   Squamous Epithelial / LPF FEW (*) RARE     WBC, UA TOO NUMEROUS TO COUNT  <3 (WBC/hpf)    RBC / HPF TOO NUMEROUS TO COUNT  <3 (RBC/hpf)    Bacteria, UA MANY (*) RARE    POCT PREGNANCY, URINE     Status: Normal   Collection Time   03/02/11  9:32 AM      Component Value Range Comment   Preg Test, Ur NEGATIVE     CBC     Status: Abnormal   Collection Time   03/02/11  9:38 AM      Component Value Range Comment   WBC 15.6 (*) 4.0 - 10.5 (K/uL)    RBC 4.21  3.87 - 5.11 (MIL/uL)    Hemoglobin 11.3 (*) 12.0 - 15.0 (g/dL)    HCT 91.4 (*) 78.2 - 46.0 (%)    MCV 82.9  78.0 - 100.0 (fL)    MCH 26.8  26.0 - 34.0 (pg)    MCHC 32.4  30.0 - 36.0 (g/dL)    RDW 95.6  21.3 - 08.6 (%)  Platelets 245  150 - 400 (K/uL)   DIFFERENTIAL     Status: Abnormal   Collection Time   03/02/11  9:38 AM      Component Value Range Comment   Neutrophils Relative 81 (*) 43 - 77 (%)    Neutro Abs 12.6 (*) 1.7 - 7.7 (K/uL)    Lymphocytes Relative 7 (*) 12 - 46 (%)    Lymphs Abs 1.1  0.7 - 4.0 (K/uL)    Monocytes Relative 12  3 - 12 (%)    Monocytes Absolute 1.8 (*) 0.1 - 1.0 (K/uL)    Eosinophils Relative 0  0 - 5 (%)    Eosinophils Absolute 0.0  0.0 - 0.7 (K/uL)    Basophils Relative 0  0 - 1 (%)    Basophils Absolute 0.0  0.0 - 0.1 (K/uL)   COMPREHENSIVE METABOLIC PANEL     Status: Abnormal   Collection Time   03/02/11  9:38 AM      Component Value Range Comment   Sodium 134 (*) 135 - 145 (mEq/L)    Potassium 3.4 (*) 3.5 - 5.1 (mEq/L)    Chloride 99  96 - 112 (mEq/L)    CO2 23  19 - 32 (mEq/L)    Glucose, Bld 133 (*) 70 - 99 (mg/dL)    BUN 19  6 - 23 (mg/dL)    Creatinine, Ser 0.45  0.50 - 1.10 (mg/dL)    Calcium 9.9  8.4 - 10.5 (mg/dL)    Total Protein 8.1  6.0 - 8.3 (g/dL)    Albumin 2.9 (*) 3.5 - 5.2 (g/dL)    AST 24  0 - 37 (U/L)    ALT 26  0 - 35 (U/L)    Alkaline Phosphatase 128 (*) 39 - 117 (U/L)    Total Bilirubin 1.0  0.3 - 1.2 (mg/dL)    GFR calc non Af  Amer >90  >90 (mL/min)    GFR calc Af Amer >90  >90 (mL/min)   T4, FREE     Status: Abnormal   Collection Time   03/02/11  9:38 AM      Component Value Range Comment   Free T4 6.93 (*) 0.80 - 1.80 (ng/dL)   CULTURE, BLOOD (ROUTINE X 2)     Status: Normal (Preliminary result)   Collection Time   03/02/11  9:55 AM      Component Value Range Comment   Specimen Description BLOOD RIGHT ANTECUBITAL      Special Requests BOTTLES DRAWN AEROBIC AND ANAEROBIC 4CC EACH      Culture NO GROWTH 1 DAY      Report Status PENDING     CULTURE, BLOOD (ROUTINE X 2)     Status: Normal (Preliminary result)   Collection Time   03/02/11  9:55 AM      Component Value Range Comment   Specimen Description BLOOD RIGHT HAND      Special Requests BOTTLES DRAWN AEROBIC AND ANAEROBIC 4CC      Culture NO GROWTH 1 DAY      Report Status PENDING     LACTIC ACID, PLASMA     Status: Normal   Collection Time   03/02/11  1:49 PM      Component Value Range Comment   Lactic Acid, Venous 1.5  0.5 - 2.2 (mmol/L)   PROCALCITONIN     Status: Normal   Collection Time   03/02/11  1:50 PM      Component Value Range Comment  Procalcitonin 0.92     CBC     Status: Abnormal   Collection Time   03/03/11  5:25 AM      Component Value Range Comment   WBC 12.2 (*) 4.0 - 10.5 (K/uL)    RBC 3.52 (*) 3.87 - 5.11 (MIL/uL)    Hemoglobin 9.8 (*) 12.0 - 15.0 (g/dL)    HCT 16.1 (*) 09.6 - 46.0 (%)    MCV 84.7  78.0 - 100.0 (fL)    MCH 27.8  26.0 - 34.0 (pg)    MCHC 32.9  30.0 - 36.0 (g/dL)    RDW 04.5  40.9 - 81.1 (%)    Platelets 209  150 - 400 (K/uL)   BASIC METABOLIC PANEL     Status: Normal   Collection Time   03/03/11  5:25 AM      Component Value Range Comment   Sodium 137  135 - 145 (mEq/L)    Potassium 3.7  3.5 - 5.1 (mEq/L)    Chloride 106  96 - 112 (mEq/L)    CO2 21  19 - 32 (mEq/L)    Glucose, Bld 89  70 - 99 (mg/dL)    BUN 18  6 - 23 (mg/dL)    Creatinine, Ser 9.14  0.50 - 1.10 (mg/dL)    Calcium 9.4  8.4 -  10.5 (mg/dL)    GFR calc non Af Amer >90  >90 (mL/min)    GFR calc Af Amer >90  >90 (mL/min)   T3, FREE     Status: Abnormal   Collection Time   03/03/11  5:25 AM      Component Value Range Comment   T3, Free >20.0 (*) 2.3 - 4.2 (pg/mL)   TSH     Status: Abnormal   Collection Time   03/03/11  5:25 AM      Component Value Range Comment   TSH <0.008 (*) 0.350 - 4.500 (uIU/mL)   CULTURE, BLOOD (ROUTINE X 2)     Status: Normal (Preliminary result)   Collection Time   03/03/11  5:30 AM      Component Value Range Comment   Specimen Description Peripheral RIGHT ARM      Special Requests Normal 6CC      Culture NO GROWTH <24 HRS      Report Status PENDING     CULTURE, BLOOD (ROUTINE X 2)     Status: Normal (Preliminary result)   Collection Time   03/03/11  5:45 AM      Component Value Range Comment   Specimen Description Peripheral RIGHT ARM      Special Requests Normal 6CC      Culture NO GROWTH <24 HRS      Report Status PENDING     T3     Status: Abnormal   Collection Time   03/03/11 12:25 PM      Component Value Range Comment   T3, Total 555.4 (*) 80.0 - 204.0 (ng/dl)   CBC     Status: Abnormal   Collection Time   03/04/11  4:34 AM      Component Value Range Comment   WBC 11.7 (*) 4.0 - 10.5 (K/uL)    RBC 3.26 (*) 3.87 - 5.11 (MIL/uL)    Hemoglobin 8.9 (*) 12.0 - 15.0 (g/dL)    HCT 78.2 (*) 95.6 - 46.0 (%)    MCV 83.1  78.0 - 100.0 (fL)    MCH 27.3  26.0 - 34.0 (pg)    MCHC  32.8  30.0 - 36.0 (g/dL)    RDW 16.1  09.6 - 04.5 (%)    Platelets 214  150 - 400 (K/uL)   COMPREHENSIVE METABOLIC PANEL     Status: Abnormal   Collection Time   03/04/11  4:34 AM      Component Value Range Comment   Sodium 138  135 - 145 (mEq/L)    Potassium 3.1 (*) 3.5 - 5.1 (mEq/L)    Chloride 106  96 - 112 (mEq/L)    CO2 22  19 - 32 (mEq/L)    Glucose, Bld 112 (*) 70 - 99 (mg/dL)    BUN 16  6 - 23 (mg/dL)    Creatinine, Ser 4.09  0.50 - 1.10 (mg/dL)    Calcium 9.0  8.4 - 10.5 (mg/dL)     Total Protein 6.0  6.0 - 8.3 (g/dL)    Albumin 2.0 (*) 3.5 - 5.2 (g/dL)    AST 43 (*) 0 - 37 (U/L)    ALT 27  0 - 35 (U/L)    Alkaline Phosphatase 129 (*) 39 - 117 (U/L)    Total Bilirubin 2.0 (*) 0.3 - 1.2 (mg/dL)    GFR calc non Af Amer >90  >90 (mL/min)    GFR calc Af Amer >90  >90 (mL/min)    Recent Results (from the past 240 hour(s))  URINE CULTURE     Status: Normal (Preliminary result)   Collection Time   03/02/11  9:00 AM      Component Value Range Status Comment   Specimen Description URINE, CLEAN CATCH   Final    Special Requests NONE   Final    Setup Time [id number]   Final    Colony Count >=100,000 COLONIES/ML   Final    Culture ESCHERICHIA COLI   Final    Report Status PENDING   Incomplete   CULTURE, BLOOD (ROUTINE X 2)     Status: Normal (Preliminary result)   Collection Time   03/02/11  9:55 AM      Component Value Range Status Comment   Specimen Description BLOOD RIGHT ANTECUBITAL   Final    Special Requests BOTTLES DRAWN AEROBIC AND ANAEROBIC 4CC EACH   Final    Culture NO GROWTH 1 DAY   Final    Report Status PENDING   Incomplete   CULTURE, BLOOD (ROUTINE X 2)     Status: Normal (Preliminary result)   Collection Time   03/02/11  9:55 AM      Component Value Range Status Comment   Specimen Description BLOOD RIGHT HAND   Final    Special Requests BOTTLES DRAWN AEROBIC AND ANAEROBIC 4CC   Final    Culture NO GROWTH 1 DAY   Final    Report Status PENDING   Incomplete   CULTURE, BLOOD (ROUTINE X 2)     Status: Normal (Preliminary result)   Collection Time   03/03/11  5:30 AM      Component Value Range Status Comment   Specimen Description Peripheral RIGHT ARM   Final    Special Requests Normal 6CC   Final    Culture NO GROWTH <24 HRS   Final    Report Status PENDING   Incomplete   CULTURE, BLOOD (ROUTINE X 2)     Status: Normal (Preliminary result)   Collection Time   03/03/11  5:45 AM      Component Value Range Status Comment   Specimen Description  Peripheral RIGHT ARM  Final    Special Requests Normal 6CC   Final    Culture NO GROWTH <24 HRS   Final    Report Status PENDING   Incomplete     Dg Chest Port 1 View  03/02/2011  *RADIOLOGY REPORT*  Clinical Data: 25 year old female with irregular heart rate and tachycardia.  PORTABLE CHEST - 1 VIEW  Comparison: None  Findings: The cardiomediastinal silhouette is unremarkable. The lungs are clear. There is no evidence of focal airspace disease, pulmonary edema, pulmonary nodule/mass, pleural effusion, or pneumothorax. No acute bony abnormalities are identified.  IMPRESSION: No evidence of active cardiopulmonary disease.  Original Report Authenticated By: Rosendo Gros, M.D.      Medications: I have reviewed the patient's current medications.  Impression: 1. Escherichia coli pyelonephritis. 2. 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 3. Productive cough with unremarkable lung examination. 4. Hypokalemia.     Plan: 1. Replete potassium. 2. Increase propranolol to 40 mg 3 times a day for symptomatic relief. 3. Await thyroid uptake scan. 4. Continue intravenous antibiotics for Escherichia coli until identification of sensitivities.     LOS: 2 days   Chen Saadeh C 03/04/2011, 8:24 AM

## 2011-03-04 NOTE — Progress Notes (Signed)
[name], [name]              ACCOUNT NO.:  [id number]  MEDICAL RECORD NO.:  [id number]  LOCATION:  A321                          FACILITY:  [facility]  PHYSICIAN:  Purcell Nails, MD DATE OF BIRTH:  January 09, 1986  DATE OF PROCEDURE:  03/04/2011 DATE OF DISCHARGE:                                PROGRESS NOTE   CHIEF COMPLAINT:  Followup for hyperthyroidism.  SUBJECTIVE:  The patient feels better than yesterday.  Pain in her neck is much better.  She is eating her breakfast this morning.she has had another spike in her fever.  Heart rate is slower than yesterday.  The patient was given radioactive iodine for scan to be completed today.  OBJECTIVE:  GENERAL:  She looks better. VITAL SIGNS:  Her blood pressure is 123/77, pulse rate is 117, temperature is 101, and oxygen saturation is 94%. HEENT:  No exophthalmos.  No proptosis. NECK:  Significant for enlarged thyroid, up to 3 to 4 times with bruit. CHEST:  Clear to auscultation bilaterally. CARDIOVASCULAR:  Significant for tachycardia.  No murmur, no gallop. ABDOMEN:  Soft and nontender. EXTREMITIES:  Slightly edematous, otherwise no deformity. SKIN:  No rash.  No hyperemia.  Feels warm to touch.  LABORATORY DATA:  Significant derangement of her thyroid hormone,  TSH is undetectable at less than 0.008.   Her total T3 is 555.4.   Free T3 is greater than 20 and  free T4 is 6.93.   Her uptake scan is in progress.  ASSESSMENT: 1. Hyperthyroidism with significantly elevated thyroid hormones and     suppressed TSH.  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  Workup is in progress.  The patient is     clinically responding to temporary measures including high dose     beta-blocker for heart rate control and tremor alleviation.  She     has responded to prednisone 10 mg p.o. by mouth daily for neck     pain.  Once the scan is complete, I will re-evaluate for definitive     treatment, which in this case  would be radioactive iodine ablative     therapy if Graves     disease is confirmed.  The patient is reportedly not breast feeding     and therapeutic dose of radioactive iodine for hyperthyroidism has no remarkable effect on casual family contacts. 2. Pyelonephritis.  Management per Dr. Karilyn Cota on antibiotics.          ______________________________ Purcell Nails, MD     GN/MEDQ  D:  03/04/2011  T:  03/04/2011  Job:  [id number]

## 2011-03-04 NOTE — Progress Notes (Signed)
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 She was given all of her options for definitive therapay. She accepts and agrees to proceed with RAI ablative therapy. I will order same. She will need out pt f/u with me in 5 weeks with repeat TFTs.

## 2011-03-04 NOTE — Progress Notes (Signed)
  [id number] 

## 2011-03-05 LAB — IRON AND TIBC
Iron: 34 ug/dL — ABNORMAL LOW (ref 42–135)
UIBC: 90 ug/dL — ABNORMAL LOW (ref 125–400)

## 2011-03-05 LAB — RETICULOCYTES
RBC.: 3.26 MIL/uL — ABNORMAL LOW (ref 3.87–5.11)
Retic Count, Absolute: 22.8 10*3/uL (ref 19.0–186.0)

## 2011-03-05 LAB — FERRITIN: Ferritin: 341 ng/mL — ABNORMAL HIGH (ref 10–291)

## 2011-03-05 LAB — BASIC METABOLIC PANEL
Chloride: 106 mEq/L (ref 96–112)
GFR calc Af Amer: >90 mL/min (ref >90)
GFR calc non Af Amer: >90 mL/min (ref >90)
Potassium: 2.8 mEq/L — ABNORMAL LOW (ref 3.5–5.1)
Sodium: 140 mEq/L (ref 135–145)

## 2011-03-05 LAB — CBC
MCHC: 33.3 g/dL (ref 30.0–36.0)
Platelets: 244 10*3/uL (ref 150–400)
RDW: 13.1 % (ref 11.5–15.5)
WBC: 10.6 10*3/uL — ABNORMAL HIGH (ref 4.0–10.5)

## 2011-03-05 MED ORDER — PROPRANOLOL HCL 40 MG PO TABS: 40.0000 mg | ORAL_TABLET | Freq: Three times a day (TID) | ORAL | Status: DC

## 2011-03-05 MED ORDER — PREDNISONE 10 MG PO TABS: 10.0000 mg | ORAL_TABLET | Freq: Every day | ORAL | Status: AC

## 2011-03-05 MED ORDER — POTASSIUM CHLORIDE CRYS ER 20 MEQ PO TBCR
40.0000 meq | EXTENDED_RELEASE_TABLET | Freq: Once | ORAL | Status: AC
  Administered 2011-03-05: 40 meq via ORAL
  Filled 2011-03-05: qty 2

## 2011-03-05 MED ORDER — CIPROFLOXACIN HCL 500 MG PO TABS: 500.0000 mg | ORAL_TABLET | Freq: Two times a day (BID) | ORAL | Status: AC

## 2011-03-05 NOTE — Progress Notes (Signed)
Discharge Summary: a/o.vss. Saline lock removed. Tele d/c. Discharge instructions given. Prescriptions given. Pt verbalized understanding of instructions. Left floor via wheelchair with nursing staff and family member.

## 2011-03-05 NOTE — Progress Notes (Signed)
Called radiology to see if the NM RAI procedure would be done here at [facility].   The radiologist I spoke with was unsure and suggested that we call back at 0700 and talk with someone then.  I will pass to the day time nurse to call and to follow up with doctor Nida if the procedure is not done here.

## 2011-03-05 NOTE — Discharge Summary (Signed)
Physician Discharge Summary  Patient ID: [name] MRN: [id number] DOB/AGE: December 09, 1985 25 y.o.  Admit date: 03/02/2011 Discharge date: 03/05/2011    Discharge Diagnoses:  1. Escherichia coli pyelonephritis, sensitive to ciprofloxacin. 2. Graves' disease. TSH less than 0.008, free T4 6.93, free T3 greater than 20. Radioactive iodine treatment scheduled as an outpatient.   Current Discharge Medication List    START taking these medications   Details  ciprofloxacin (CIPRO) 500 MG tablet Take 1 tablet (500 mg total) by mouth 2 (two) times daily. Qty: 10 tablet, Refills: 0    predniSONE (DELTASONE) 10 MG tablet Take 1 tablet (10 mg total) by mouth daily before breakfast. Qty: 10 tablet, Refills: 0    propranolol (INDERAL) 40 MG tablet Take 1 tablet (40 mg total) by mouth 3 (three) times daily. Qty: 90 tablet, Refills: 0      CONTINUE these medications which have NOT CHANGED   Details  albuterol (PROVENTIL HFA;VENTOLIN HFA) 108 (90 BASE) MCG/ACT inhaler Inhale 2 puffs into the lungs every 6 (six) hours as needed. Shortness of breath/asthma       STOP taking these medications     calcium carbonate (TUMS EX) 750 MG chewable tablet      hydrochlorothiazide (HYDRODIURIL) 25 MG tablet         Discharged Condition: Improved and stable.    Consults: Endocrinology, Dr.Nida.  Significant Diagnostic Studies: Dg Chest 2 View  03/04/2011  *RADIOLOGY REPORT*  Clinical Data: Cough and fever  CHEST - 2 VIEW  Comparison: 03/02/2011  Findings: Heart size is normal.  No pleural effusion or pulmonary edema.  No airspace consolidation identified.  Review of the visualized osseous structures is unremarkable.  IMPRESSION:  1.  No active cardiopulmonary abnormalities.  Original Report Authenticated By: Rosealee Albee, M.D.   Nm Thyroid Scan/uptake 24 Hr  03/04/2011  *RADIOLOGY REPORT*  Clinical Data:  Clinical hyperthyroidism.  THYROID SCAN AND UPTAKE - 24 HOURS  Technique:   Following the per oral administration of I-131 sodium iodide, the patient returned at 24 hours and uptake measurements were acquired with the uptake probe centered on the neck.  Thyroid imaging was performed following the intravenous administration of the Tc-43m Pertechnetate.  Radiopharmaceuticals:  7.0 uCi I-131 Sodium Iodide and 10.3 mCi TC- 1m Pertechnetate  Comparison:  None.  Findings: The thyroid scan demonstrates diffuse increased uptake in an enlarged thyroid gland.  No hot or cold lesions are seen.  A pyramidal lobe is noted.  The 24 hour I-131 uptake was calculated at 69%.  Normal is 10-35%.  IMPRESSION: Findings consistent with Graves' disease.  Original Report Authenticated By: P. Loralie Champagne, M.D.   Dg Chest Port 1 View  03/02/2011  *RADIOLOGY REPORT*  Clinical Data: 25 year old female with irregular heart rate and tachycardia.  PORTABLE CHEST - 1 VIEW  Comparison: None  Findings: The cardiomediastinal silhouette is unremarkable. The lungs are clear. There is no evidence of focal airspace disease, pulmonary edema, pulmonary nodule/mass, pleural effusion, or pneumothorax. No acute bony abnormalities are identified.  IMPRESSION: No evidence of active cardiopulmonary disease.  Original Report Authenticated By: Rosendo Gros, M.D.    Lab Results: Results for orders placed during the hospital encounter of 03/02/11 (from the past 48 hour(s))  T3     Status: Abnormal   Collection Time   03/03/11 12:25 PM      Component Value Range Comment   T3, Total 555.4 (*) 80.0 - 204.0 (ng/dl)   CBC  Status: Abnormal   Collection Time   03/04/11  4:34 AM      Component Value Range Comment   WBC 11.7 (*) 4.0 - 10.5 (K/uL)    RBC 3.26 (*) 3.87 - 5.11 (MIL/uL)    Hemoglobin 8.9 (*) 12.0 - 15.0 (g/dL)    HCT 96.0 (*) 45.4 - 46.0 (%)    MCV 83.1  78.0 - 100.0 (fL)    MCH 27.3  26.0 - 34.0 (pg)    MCHC 32.8  30.0 - 36.0 (g/dL)    RDW 09.8  11.9 - 14.7 (%)    Platelets 214  150 - 400 (K/uL)     COMPREHENSIVE METABOLIC PANEL     Status: Abnormal   Collection Time   03/04/11  4:34 AM      Component Value Range Comment   Sodium 138  135 - 145 (mEq/L)    Potassium 3.1 (*) 3.5 - 5.1 (mEq/L)    Chloride 106  96 - 112 (mEq/L)    CO2 22  19 - 32 (mEq/L)    Glucose, Bld 112 (*) 70 - 99 (mg/dL)    BUN 16  6 - 23 (mg/dL)    Creatinine, Ser 8.29  0.50 - 1.10 (mg/dL)    Calcium 9.0  8.4 - 10.5 (mg/dL)    Total Protein 6.0  6.0 - 8.3 (g/dL)    Albumin 2.0 (*) 3.5 - 5.2 (g/dL)    AST 43 (*) 0 - 37 (U/L)    ALT 27  0 - 35 (U/L)    Alkaline Phosphatase 129 (*) 39 - 117 (U/L)    Total Bilirubin 2.0 (*) 0.3 - 1.2 (mg/dL)    GFR calc non Af Amer >90  >90 (mL/min)    GFR calc Af Amer >90  >90 (mL/min)   RETICULOCYTES     Status: Abnormal   Collection Time   03/05/11  5:49 AM      Component Value Range Comment   Retic Ct Pct 0.7  0.4 - 3.1 (%)    RBC. 3.26 (*) 3.87 - 5.11 (MIL/uL)    Retic Count, Manual 22.8  19.0 - 186.0 (K/uL)   CBC     Status: Abnormal   Collection Time   03/05/11  5:49 AM      Component Value Range Comment   WBC 10.6 (*) 4.0 - 10.5 (K/uL)    RBC 3.26 (*) 3.87 - 5.11 (MIL/uL)    Hemoglobin 9.0 (*) 12.0 - 15.0 (g/dL)    HCT 56.2 (*) 13.0 - 46.0 (%)    MCV 82.8  78.0 - 100.0 (fL)    MCH 27.6  26.0 - 34.0 (pg)    MCHC 33.3  30.0 - 36.0 (g/dL)    RDW 86.5  78.4 - 69.6 (%)    Platelets 244  150 - 400 (K/uL)   BASIC METABOLIC PANEL     Status: Abnormal   Collection Time   03/05/11  5:49 AM      Component Value Range Comment   Sodium 140  135 - 145 (mEq/L)    Potassium 2.8 (*) 3.5 - 5.1 (mEq/L)    Chloride 106  96 - 112 (mEq/L)    CO2 24  19 - 32 (mEq/L)    Glucose, Bld 113 (*) 70 - 99 (mg/dL)    BUN 14  6 - 23 (mg/dL)    Creatinine, Ser 2.95 (*) 0.50 - 1.10 (mg/dL)    Calcium 8.9  8.4 - 10.5 (mg/dL)  GFR calc non Af Amer >90  >90 (mL/min)    GFR calc Af Amer >90  >90 (mL/min)    Recent Results (from the past 240 hour(s))  URINE CULTURE     Status: Normal    Collection Time   03/02/11  9:00 AM      Component Value Range Status Comment   Specimen Description URINE, CLEAN CATCH   Final    Special Requests NONE   Final    Setup Time [id number]   Final    Colony Count >=100,000 COLONIES/ML   Final    Culture ESCHERICHIA COLI   Final    Report Status 03/04/2011 FINAL   Final    Organism ID, Bacteria ESCHERICHIA COLI   Final   CULTURE, BLOOD (ROUTINE X 2)     Status: Normal (Preliminary result)   Collection Time   03/02/11  9:55 AM      Component Value Range Status Comment   Specimen Description BLOOD RIGHT ANTECUBITAL   Final    Special Requests BOTTLES DRAWN AEROBIC AND ANAEROBIC 4CC EACH   Final    Culture NO GROWTH 2 DAYS   Final    Report Status PENDING   Incomplete   CULTURE, BLOOD (ROUTINE X 2)     Status: Normal (Preliminary result)   Collection Time   03/02/11  9:55 AM      Component Value Range Status Comment   Specimen Description BLOOD RIGHT HAND   Final    Special Requests BOTTLES DRAWN AEROBIC AND ANAEROBIC 4CC   Final    Culture NO GROWTH 2 DAYS   Final    Report Status PENDING   Incomplete   CULTURE, BLOOD (ROUTINE X 2)     Status: Normal (Preliminary result)   Collection Time   03/03/11  5:30 AM      Component Value Range Status Comment   Specimen Description BLOOD RIGHT ARM   Final    Special Requests BOTTLES DRAWN AEROBIC AND ANAEROBIC 6CC   Final    Culture NO GROWTH 1 DAY   Final    Report Status PENDING   Incomplete   CULTURE, BLOOD (ROUTINE X 2)     Status: Normal (Preliminary result)   Collection Time   03/03/11  5:45 AM      Component Value Range Status Comment   Specimen Description BLOOD RIGHT ARM   Final    Special Requests BOTTLES DRAWN AEROBIC AND ANAEROBIC 6CC   Final    Culture NO GROWTH 1 DAY   Final    Report Status PENDING   Incomplete      Hospital Course: This 25 year old lady was admitted with symptoms of suprapubic pain, bilateral flank pain, nausea and vomiting, dysuria and urinary  frequency as well as neck pain and swelling. On admission she was febrile, felt to have pyelonephritis clinically but also was found to have a tender enlarged thyroid gland. Urine culture showed the presence of Escherichia coli and sensitive to ceftriaxone which she had been started on and also sensitive to ciprofloxacin. Her fevers eventually defervesced but she continued to have a resting tachycardia. Thyroid function tests were highly abnormal indicative of a hyperthyroid state. She was started on propranolol for symptomatic relief as well as oral prednisone. She underwent nuclear medicine thyroid uptake scan which was significantly positive for Graves' disease. Dr. Fransico Him, endocrinology, consulted on this patient and has advised for her to have radioactive iodine treatment for her Luiz Blare' disease. She will get this in  the next few days. Today she feels improved and is keen to go home.  Discharge Exam: Blood pressure 156/98, pulse 93, temperature 98.3 F (36.8 C), temperature source Oral, resp. rate 18, height 5\' 5"  (1.651 m), weight 67.586 kg (149 lb), SpO2 100.00%, not currently breastfeeding. She looks systemically well. Heart sounds are present and normal. Lung fields are clear. She is alert and orientated. 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  Disposition: Home with a further 5 day course of oral ciprofloxacin. She also will have propranolol 3 times a day and prednisone daily which will help her symptoms of Graves' disease. She will then received radioactive iodine in the next few days. She will followup with Dr. Fransico Him in the next 5-6 weeks.  Discharge Orders    Future Appointments: Provider: Department: Dept Phone: Center:   03/10/2011 2:15 PM Joseph Pierini. Dorcas Carrow, CNM [facility] [phone] [facility]     Future Orders Please Complete By Expires   Diet - low sodium heart healthy      Increase activity slowly      Call MD for:  temperature >100.4      Call MD for:   persistant nausea and vomiting         Follow-up Information    Follow up with NIDA,GEBRESELASSIE. Make an appointment in 5 weeks.   Contact information:   [address] [place] [address] [phone]          Signed: Wilson Singer 03/05/2011, 10:04 AM

## 2011-03-07 LAB — CULTURE, BLOOD (ROUTINE X 2)
Culture: NO GROWTH
Culture: NO GROWTH

## 2011-03-08 ENCOUNTER — Encounter (HOSPITAL_COMMUNITY): Admit: 2011-03-08 | Payer: Medicaid Other

## 2011-03-08 LAB — CULTURE, BLOOD (ROUTINE X 2): Culture: NO GROWTH

## 2011-03-10 ENCOUNTER — Ambulatory Visit: Payer: Medicaid Other | Admitting: Physician Assistant

## 2011-03-16 ENCOUNTER — Telehealth: Payer: Self-pay

## 2011-03-16 ENCOUNTER — Encounter (HOSPITAL_COMMUNITY): Payer: Medicaid Other

## 2011-03-16 NOTE — Telephone Encounter (Signed)
Pt called and left a message on nurse call back line stating that she was in the hospital last week and needed someone to talk too.

## 2011-03-16 NOTE — Telephone Encounter (Signed)
Called pt and left message that if she still has questions or concerns to call back to the nurse voice mail and leave a new message.

## 2011-03-16 NOTE — Telephone Encounter (Signed)
Called pt and left message that we are returning her call to give [place] a call back to the clinics.

## 2011-03-19 ENCOUNTER — Encounter (HOSPITAL_COMMUNITY)
Admission: RE | Admit: 2011-03-19 | Discharge: 2011-03-19 | Disposition: A | Discharge status: Home / Self Care | Payer: Medicaid Other | Source: Ambulatory Visit | Attending: "Endocrinology | Admitting: "Endocrinology

## 2011-03-19 ENCOUNTER — Encounter (HOSPITAL_COMMUNITY): Payer: Self-pay

## 2011-03-19 DIAGNOSIS — E0500 Thyrotoxicosis with diffuse goiter without thyrotoxic crisis or storm: Secondary | ICD-10-CM | POA: Insufficient documentation

## 2011-03-19 MED ORDER — SODIUM IODIDE I 131 CAPSULE
10.0000 | Freq: Once | INTRAVENOUS | Status: AC | PRN
  Administered 2011-03-19: 10 via ORAL

## 2011-03-23 ENCOUNTER — Encounter (HOSPITAL_COMMUNITY): Payer: Self-pay | Admitting: *Deleted

## 2011-03-23 ENCOUNTER — Emergency Department (HOSPITAL_COMMUNITY)
Admission: EM | Admit: 2011-03-23 | Discharge: 2011-03-24 | Discharge status: Against Medical Advice | Payer: Medicaid Other | Attending: Emergency Medicine | Admitting: Emergency Medicine

## 2011-03-23 DIAGNOSIS — R079 Chest pain, unspecified: Secondary | ICD-10-CM | POA: Insufficient documentation

## 2011-03-23 DIAGNOSIS — M79609 Pain in unspecified limb: Secondary | ICD-10-CM | POA: Insufficient documentation

## 2011-03-23 DIAGNOSIS — R000 Tachycardia, unspecified: Secondary | ICD-10-CM | POA: Insufficient documentation

## 2011-03-23 HISTORY — DX: Thyrotoxicosis with diffuse goiter without thyrotoxic crisis or storm: E05.00

## 2011-03-23 HISTORY — DX: Disorder of thyroid, unspecified: E07.9

## 2011-03-23 NOTE — ED Notes (Signed)
Pt reports she had radiation tx Friday for graves ds and since she has been having pain in left foot, pt also reports intermittent episodes of cp and tachycardia, pt denies cp at this time, hr 122

## 2011-03-24 NOTE — ED Notes (Signed)
Patient not in room for assessment.

## 2011-03-30 ENCOUNTER — Telehealth: Payer: Self-pay | Admitting: *Deleted

## 2011-03-30 NOTE — Telephone Encounter (Signed)
Pt left message stating that she missed her PP appt due to being inpatient @ [facility]. She was diagnosed with Graves disease and is the cancerous type-(per pt).  She has been given radioactive iodine. She needs 2 things:  Re-schedule PP appt and also is requesting a referral to endocrinologist on [facility].- they will not see her without a referral.  She states she is not happy with the doctor in [place].  I called pt and informed her of appt on 04/07/11 @ 1545. Pt agreed and voiced understanding.

## 2011-04-07 ENCOUNTER — Ambulatory Visit: Payer: Medicaid Other | Admitting: Obstetrics and Gynecology

## 2011-05-19 ENCOUNTER — Emergency Department (HOSPITAL_COMMUNITY): Payer: Self-pay

## 2011-05-19 ENCOUNTER — Emergency Department (HOSPITAL_COMMUNITY)
Admission: EM | Admit: 2011-05-19 | Discharge: 2011-05-19 | Disposition: A | Discharge status: Home / Self Care | Payer: Self-pay | Attending: Emergency Medicine | Admitting: Emergency Medicine

## 2011-05-19 ENCOUNTER — Encounter (HOSPITAL_COMMUNITY): Payer: Self-pay | Admitting: *Deleted

## 2011-05-19 DIAGNOSIS — J45909 Unspecified asthma, uncomplicated: Secondary | ICD-10-CM | POA: Insufficient documentation

## 2011-05-19 DIAGNOSIS — J069 Acute upper respiratory infection, unspecified: Secondary | ICD-10-CM | POA: Insufficient documentation

## 2011-05-19 MED ORDER — ALBUTEROL SULFATE HFA 108 (90 BASE) MCG/ACT IN AERS
2.0000 | INHALATION_SPRAY | RESPIRATORY_TRACT | Status: DC
  Filled 2011-05-19: qty 6.7

## 2011-05-19 NOTE — ED Notes (Signed)
Pt reports she has had increased cough, congestion and sneezing x 6 days

## 2011-05-19 NOTE — ED Provider Notes (Signed)
History     CSN: [id number]  Arrival date & time 05/19/11  1478   Chief Complaint  Patient presents with  . Nasal Congestion  . Cough   HPI Pt was seen at 0235.    Per pt, c/o gradual onset and persistence of constant cough, runny/stuffy nose, and sneezing x1 week.  +multiple others in household with same symptoms.  Denies rash, no fevers, no N/V/D, no CP/SOB, no abd pain.     Past Medical History  Diagnosis Date  . Anemia     all 3 pregnancies  . Hyperemesis     3rd pregnancy  . Gestational hypertension 2010    with 3rd pregnancy  . Preterm labor     with 3rd pregnancy  . Asthma     albuterol inhaler, last used one year ago  . Thyroid disease   . Grave's disease     Past Surgical History  Procedure Date  . Dilation and curettage of uterus 2010    Family History  Problem Relation Age of Onset  . Hypertension Father   . Hypertension Mother   . Anemia Mother   . Anesthesia problems Neg Hx   . Hypotension Neg Hx   . Malignant hyperthermia Neg Hx   . Pseudochol deficiency Neg Hx     History  Substance Use Topics  . Smoking status: Former Smoker -- 0.2 packs/day for .5 years  . Smokeless tobacco: Never Used  . Alcohol Use: No    OB History    Grav Para Term Preterm Abortions TAB SAB Ect Mult Living   5 4 3 1 1  1   4       Review of Systems ROS: Statement: All systems negative except as marked or noted in the HPI; Constitutional: Negative for fever and chills. ; ; Eyes: Negative for eye pain, redness and discharge. ; ; ENMT: Negative for ear pain, hoarseness, sore throat.  +nasal congestion, rhinorrhea, sinus pressure, sneezing. ; ; Cardiovascular: Negative for chest pain, palpitations, diaphoresis, dyspnea and peripheral edema. ; ; Respiratory: +cough. Negative for wheezing and stridor. ; ; Gastrointestinal: Negative for nausea, vomiting, diarrhea, abdominal pain, blood in stool, hematemesis, jaundice and rectal bleeding. . ; ; Genitourinary: Negative for  dysuria, flank pain and hematuria. ; ; Musculoskeletal: Negative for back pain and neck pain. Negative for swelling and trauma.; ; Skin: Negative for pruritus, rash, abrasions, blisters, bruising and skin lesion.; ; Neuro: Negative for headache, lightheadedness and neck stiffness. Negative for weakness, altered level of consciousness , altered mental status, extremity weakness, paresthesias, involuntary movement, seizure and syncope.     Allergies  Shellfish allergy  Home Medications   Current Outpatient Rx  Name Route Sig Dispense Refill  . ALBUTEROL SULFATE HFA 108 (90 BASE) MCG/ACT IN AERS Inhalation Inhale 2 puffs into the lungs every 6 (six) hours as needed. Shortness of breath/asthma     . PROPRANOLOL HCL 40 MG PO TABS Oral Take 1 tablet (40 mg total) by mouth 3 (three) times daily. 90 tablet 0    BP 143/70  Pulse 88  Temp(Src) 98.4 F (36.9 C) (Oral)  Resp 18  Ht 5\' 5"  (1.651 m)  Wt 184 lb (83.462 kg)  BMI 30.62 kg/m2  SpO2 96%  LMP 05/12/2011  Physical Exam 0240: Physical examination:  Nursing notes reviewed; Vital signs and O2 SAT reviewed;  Constitutional: Well developed, Well nourished, Well hydrated, In no acute distress; Head:  Normocephalic, atraumatic; Eyes: EOMI, PERRL, No scleral icterus; ENMT: TM's  clear bilat.  +edemetous nasal turbinates bilat with clear rhinorrhea., Mouth and pharynx normal, Mucous membranes moist; Neck: Supple, Full range of motion, No lymphadenopathy; Cardiovascular: Regular rate and rhythm, No murmur, rub, or gallop; Respiratory: Breath sounds clear & equal bilaterally, No rales, rhonchi, wheezes, or rub, Normal respiratory effort/excursion, speaking full sentences with ease; Chest: Nontender, Movement normal; Extremities: Pulses normal, No tenderness, No edema, No calf edema or asymmetry.; Neuro: AA&Ox3, Major CN grossly intact. Gait steady. No gross focal motor or sensory deficits in extremities.; Skin: Color normal, Warm, Dry, no rash.    ED  Course  Procedures   MDM  MDM Reviewed: nursing note and vitals Interpretation: x-ray     Dg Chest 2 View 05/19/2011  *RADIOLOGY REPORT*  Clinical Data: Runny nose and congestion for 2 days  CHEST - 2 VIEW  Comparison: 03/04/2011  Findings: Normal heart size and pulmonary vascularity.  No focal airspace consolidation in the lungs.  No blunting of costophrenic angles.  No pneumothorax. Tiny calcified granuloma in the right upper lung.  No significant change since previous study.  IMPRESSION: No evidence of active pulmonary disease.  Original Report Authenticated By: Marlon Pel, M.D.     3:34 AM:  No coughing while in ED.  Resps easy.  Ambulates with steady gait, no resp distress.  Will d/c home and encourage outpt f/u.  Dx testing d/w pt.  Questions answered.  Verb understanding, agreeable to d/c home with outpt f/u.     Sampson Self Allison Quarry, DO 05/21/11 [phone]

## 2011-05-19 NOTE — ED Notes (Signed)
A&ox4; in no distress.  Instructions reviewed; verbalizes understanding. 

## 2011-05-27 ENCOUNTER — Ambulatory Visit (INDEPENDENT_AMBULATORY_CARE_PROVIDER_SITE_OTHER): Payer: Self-pay | Admitting: Advanced Practice Midwife

## 2011-05-27 ENCOUNTER — Encounter: Payer: Self-pay | Admitting: Advanced Practice Midwife

## 2011-05-27 VITALS — BP 127/89 | HR 74 | Temp 98.7°F | Ht 66.0 in | Wt 176.9 lb

## 2011-05-27 DIAGNOSIS — Z309 Encounter for contraceptive management, unspecified: Secondary | ICD-10-CM

## 2011-05-27 DIAGNOSIS — E0500 Thyrotoxicosis with diffuse goiter without thyrotoxic crisis or storm: Secondary | ICD-10-CM

## 2011-05-27 LAB — POCT PREGNANCY, URINE: Preg Test, Ur: NEGATIVE

## 2011-05-27 MED ORDER — MEDROXYPROGESTERONE ACETATE 150 MG/ML IM SUSP
150.0000 mg | Freq: Once | INTRAMUSCULAR | Status: AC
  Administered 2011-05-27: 150 mg via INTRAMUSCULAR

## 2011-05-27 NOTE — Progress Notes (Signed)
  Subjective:     [name] is a 26 y.o. female who presents for a postpartum visit. She is 4 months postpartum following a spontaneous vaginal delivery. I have fully reviewed the prenatal and intrapartum course, mild gestational hypertension. The delivery was at term. Outcome: spontaneous vaginal delivery. Anesthesia: epidural. Postpartum course has been complicated by postpartum thyroiditis, pyelonephritis. Was hospitalized for one week in November. Underwent radioiodine ablation as an outpatient approximately 2 months ago, and hasn't followed up with endocrinologist Dr. Fransico Him. Requests new provider in [place]. Baby's course has been uncomplicated. Baby is feeding by bottle - [employer] Good Start. Bleeding no bleeding and has restarted regular menses. Bowel function is normal. Bladder function is normal. Patient is not sexually active. Contraception method is none and desires essure and depo in the interval. Postpartum depression screening: negative.  The following portions of the patient's history were reviewed and updated as appropriate: allergies, current medications, past family history, past medical history, past social history, past surgical history and problem list.  Review of Systems Pertinent items are noted in HPI.  Denies palpitations, dyspnea, chest pain, abdominal pain, symptoms she attributes to thyroid. Has lost 5-10 lbs since pregnancy completed.   Objective:    BP 127/89  Pulse 74  Temp(Src) 98.7 F (37.1 C) (Oral)  Ht 5\' 6"  (1.676 m)  Wt 176 lb 14.4 oz (80.241 kg)  BMI 28.55 kg/m2  LMP 05/12/2011  Breastfeeding? No  General:  alert, cooperative and appears stated age; Mild thyromegaly, no masses palpated.    Breasts:  inspection negative, no nipple discharge or bleeding, no masses or nodularity palpable  Lungs: clear to auscultation bilaterally  Heart:  regular rate and rhythm, S1, S2 normal, no murmur, click, rub or gallop  Abdomen: soft, non-tender; bowel sounds  normal; no masses,  no organomegaly   Vulva:  normal  Vagina: normal vagina  Cervix:  no cervical motion tenderness  Corpus: normal size, contour, position, consistency, mobility, non-tender  Adnexa:  normal adnexa  Rectal Exam: Normal rectovaginal exam        Assessment:    Normal postpartum exam. Pap smear not done at today's visit (negative 6/12).  Thyroid disease.   Plan:    1. Contraception: Depo-Provera injections and will schedule f/u to discuss essure vs BTL. Consent for surgical sterilization signed again today as last form has expired.   2. Hyperthyroidism. S/p radioiodine ablation 2 months ago. No overt signs of hypothyroid or hyperthyroidism today. Will make endocrinology referral as patient needs f/u testing post ablation.   3. Follow up in: a few weeks for contraceptive counseling regarding essure procedure.  Advised to obtain PCP for medical management of asthma, etc.

## 2011-05-27 NOTE — Progress Notes (Signed)
States is supposed to get a referral for specialist for thyroid disease(Graves disease) follow up, wants information on esure

## 2011-06-24 ENCOUNTER — Ambulatory Visit (INDEPENDENT_AMBULATORY_CARE_PROVIDER_SITE_OTHER): Payer: Self-pay | Admitting: Obstetrics & Gynecology

## 2011-06-24 ENCOUNTER — Encounter: Payer: Self-pay | Admitting: Obstetrics & Gynecology

## 2011-06-24 VITALS — BP 128/88 | HR 99 | Temp 97.8°F | Ht 66.0 in | Wt 185.1 lb

## 2011-06-24 DIAGNOSIS — Z302 Encounter for sterilization: Secondary | ICD-10-CM | POA: Insufficient documentation

## 2011-06-24 DIAGNOSIS — Z01818 Encounter for other preprocedural examination: Secondary | ICD-10-CM

## 2011-06-24 NOTE — Progress Notes (Signed)
History:  26 y.o. [id number] here today for discussion about laparoscopic BTL, s/p SVD in 01/2011.  Medicaid re-certification in progress. No other concerns.  The following portions of the patient's history were reviewed and updated as appropriate: allergies, current medications, past family history, past medical history, past social history, past surgical history and problem list.   Objective:  Physical Exam Blood pressure 128/88, pulse 99, temperature 97.8 F (36.6 C), temperature source Oral, height 5\' 6"  (1.676 m), weight 185 lb 1.6 oz (83.961 kg), last menstrual period 06/16/2011. Gen: NAD Abd: Soft, nontender and nondistended Pelvic: Deferred  Assessment & Plan:  Patient desires permanent sterilization.  Other reversible forms of contraception were discussed with patient; she declines all other modalities. Risks of procedure discussed with patient including but not limited to: risk of regret, permanence of method, bleeding, infection, injury to surrounding organs and need for additional procedures.  Failure risk of 0.5-1% with increased risk of ectopic gestation if pregnancy occurs was also discussed with patient.   Patient verbalized understanding of these risks and wants to proceed with sterilization.  Medicaid consent obtained, she was told that if Medicaid was not active at the time of surgery, she may need to make alternative payment plans for this elective procedure.  All questions were answered.  She was told that she will be contacted by our surgical scheduler regarding the time and date of her surgery (will be >30 days from today because of Medicaid) ; routine preoperative instructions of having nothing to eat or drink after midnight on the day prior to surgery and also coming to the hospital 1 1/2 hours prior to her time of surgery were also emphasized.  She was told she may be called by the preoperative nurses and will be given further preoperative instructions.

## 2011-06-24 NOTE — Patient Instructions (Signed)
Laparoscopic Tubal Ligation Laparoscopic tubal ligation is a procedure that closes the fallopian tubes. Tubal ligation is also known as getting your "tubes tied." It is a brief, common and relatively simple surgical procedure. Tubal ligation is done to cause sterilization. Sterilization means you will not be able to get pregnant or have a baby. Sometimes a tubal ligation may be reversed, but this should not be considered a possibility because of failure to get pregnant and the increased risk of tubal (ectopic) pregnancy. If you want to have future pregnancies, you should not have this procedure. Use other forms of contraception. Tubal ligation can be done at any time during your menstrual cycle. This procedure has a less than 1% failure rate. Tubal ligation does not protect against sexually transmitted disease. LET YOUR CAREGIVER KNOW ABOUT:  Allergies, especially to medicines.   All the medicines you are taking, even herbs, eyedrops, steroids, creams, and over-the-counter drugs.   The possibility of being pregnant.   Past problems with medicines.   History of blood clots or other blood problems.   Past surgeries, medical or health problems.  RISKS AND COMPLICATIONS  Your caregiver will discuss the risks with you before the procedure. Some problems that can happen after this procedure include:  Infection. A germ starts growing in one of the wounds. This can usually be treated with antibiotic medicines.   Bleeding following surgery. Your surgeon takes every precaution to keep this from happening.   Damage to other organs. If damage to other organs or excessive bleeding should occur, it may be necessary to convert the laparoscopic procedure into an open abdominal procedure. Scarring (adhesions) from past surgeries or disease may also be a cause to change this procedure to an open abdominal operation.   Anesthetic side effects.  BEFORE THE PROCEDURE  Do not take aspirin or blood thinners a  week before the procedure. This can cause bleeding. Do as directed by your caregiver.   Do not eat or drink anything 6 to 8 hours before the procedure.   Let your caregiver know if you get a cold or an infection before the procedure.   Arrive at the clinic or hospital 60 minutes before the surgery or as directed.  PROCEDURE   You may be given a mild drug (sedative) to help you relax before the procedure. Once in the operating room, you will be given a general anesthetic to make you sleep (unless you and your caregiver choose a different anesthetic).   Once you are sleeping, your surgeon makes your abdomen larger with a harmless gas (carbon dioxide). This makes your organs easier to see and gives room to operate.   A laparoscope is then inserted into your abdomen through a small cut (incision) below the navel. A laparoscope is a thin, lighted, pencil-sized instrument. It is like a telescope. Once inserted, your caregiver can look at the organs inside your abdomen. He or she can see if there is anything abnormal.   Other small instruments also are put into the abdomen through other small openings (portals). Many surgeons attach a video camera to the laparoscope to enlarge the view. The fallopian tubes are located and either burned closed (cauterized) or a plastic clip is placed on them to close the tubes.  Sometimes, your surgeon may take tissue samples (biopsies) from other organs if he or she sees something abnormal. Biopsies can help to diagnose or confirm a disease. The samples are examined under a microscope. AFTER THE PROCEDURE   The gas   is released from inside your abdomen.   Your incisions are closed with stitches (sutures). Because these incisions are small (usually less than  inch), there is usually little discomfort following the procedure.   You will rest in a recovery room until you are stable and doing well.   As long as there are no problems, you may be allowed to go home.    Someone will need to drive you home.   You will have some mild discomfort in the throat. This is from the tube placed in your throat while you were sleeping.   You may experience discomfort in the shoulder area from some trapped air between the liver and diaphragm. This will slowly go away on its own.   You will also have some mild abdominal discomfort. You will also have discomfort from the incisions where the instruments were placed into your abdomen.  HOME CARE INSTRUCTIONS   Only take over-the-counter or prescription medicines for pain, discomfort, or fever as directed by your caregiver. Do not take aspirin. It can cause bleeding.   Resume daily activities, diet, rest, driving, and work as directed.   Showers are preferred over baths.   Resume sexual activities in 1 week or as directed.   If biopsies were taken, find out how to get your results. The results are usually given during the follow-up visit. Do not assume tests are normal if you do not hear from your caregiver. It is your responsibility to obtain your results.   Change the dressings as directed.   It is helpful to have someone with you for several hours after you go home. They can help you and be available if you have problems.  SEEK MEDICAL CARE IF:   You have increasing abdominal pain.   You develop new pain in your shoulders in the shoulder strap area that gets worse with time. Some pain is common and expected.   You feel lightheaded or faint.   You have chills or fever.   You develop bleeding or drainage from the suture sites or the vagina after surgery.   You develop chest pain.   You develop shortness of breath.   You develop a rash.  Document Released: 07/12/2000 Document Revised: 03/25/2011 Document Reviewed: 10/25/2007 [employer] Patient Information 2012 [employer]. 

## 2011-07-21 ENCOUNTER — Encounter (HOSPITAL_COMMUNITY): Payer: Self-pay | Admitting: Pharmacist

## 2011-08-03 ENCOUNTER — Inpatient Hospital Stay (HOSPITAL_COMMUNITY): Admission: RE | Admit: 2011-08-03 | Payer: Self-pay | Source: Ambulatory Visit

## 2011-08-05 ENCOUNTER — Encounter (HOSPITAL_COMMUNITY): Payer: Self-pay | Admitting: *Deleted

## 2011-08-05 ENCOUNTER — Inpatient Hospital Stay (HOSPITAL_COMMUNITY)
Admission: AD | Admit: 2011-08-05 | Discharge: 2011-08-05 | Disposition: A | Discharge status: Home / Self Care | Payer: Medicaid Other | Source: Ambulatory Visit | Attending: Family Medicine | Admitting: Family Medicine

## 2011-08-05 ENCOUNTER — Other Ambulatory Visit: Payer: Self-pay

## 2011-08-05 DIAGNOSIS — M549 Dorsalgia, unspecified: Secondary | ICD-10-CM | POA: Insufficient documentation

## 2011-08-05 DIAGNOSIS — R51 Headache: Secondary | ICD-10-CM

## 2011-08-05 DIAGNOSIS — E0500 Thyrotoxicosis with diffuse goiter without thyrotoxic crisis or storm: Secondary | ICD-10-CM

## 2011-08-05 DIAGNOSIS — I498 Other specified cardiac arrhythmias: Secondary | ICD-10-CM | POA: Insufficient documentation

## 2011-08-05 DIAGNOSIS — I10 Essential (primary) hypertension: Secondary | ICD-10-CM | POA: Insufficient documentation

## 2011-08-05 LAB — URINE MICROSCOPIC-ADD ON

## 2011-08-05 LAB — URINALYSIS, ROUTINE W REFLEX MICROSCOPIC
Glucose, UA: NEGATIVE mg/dL
Ketones, ur: NEGATIVE mg/dL
Protein, ur: 30 mg/dL — AB
Urobilinogen, UA: 0.2 mg/dL (ref 0.0–1.0)

## 2011-08-05 LAB — WET PREP, GENITAL
Clue Cells Wet Prep HPF POC: NONE SEEN
Trich, Wet Prep: NONE SEEN
Yeast Wet Prep HPF POC: NONE SEEN

## 2011-08-05 MED ORDER — HYDROCHLOROTHIAZIDE 25 MG PO TABS: 25.0000 mg | ORAL_TABLET | Freq: Every day | ORAL | Status: DC

## 2011-08-05 NOTE — MAU Provider Note (Signed)
History     CSN: [id number]  Arrival date and time: 08/05/11 1308   First Provider Initiated Contact with Patient 08/05/11 1431     25 [id number]  Chief Complaint  Patient presents with  . Back Pain  . Sinusitis   HPI Pt presents to MAU with abdominal "flutters", back pain, h/a, feeling like she is cold all the time, and a dark vaginal discharge.  All these symptoms started about 2 weeks ago.  She denies dizziness, n/v, urinary symptoms, vaginal itching/burning, or fever.  She  Had RAI treatment for Graves disease in February and reports she was supposed to take medication afterwards but was never prescribed anything and did not have a follow up visit scheduled.     OB History    Grav Para Term Preterm Abortions TAB SAB Ect Mult Living   5 4 3 1 1  1   4       Past Medical History  Diagnosis Date  . Anemia     all 3 pregnancies  . Hyperemesis     3rd pregnancy  . Gestational hypertension 2010    with 3rd pregnancy  . Preterm labor     with 3rd pregnancy  . Asthma     albuterol inhaler, last used one year ago  . Thyroid disease     had radioactive iodine  . Grave's disease     Past Surgical History  Procedure Date  . Dilation and curettage of uterus 2010    Family History  Problem Relation Age of Onset  . Hypertension Father   . Hypertension Mother   . Anemia Mother   . Anesthesia problems Neg Hx   . Hypotension Neg Hx   . Malignant hyperthermia Neg Hx   . Pseudochol deficiency Neg Hx     History  Substance Use Topics  . Smoking status: Current Everyday Smoker -- 0.2 packs/day for .5 years    Types: Cigarettes  . Smokeless tobacco: Never Used  . Alcohol Use: No    Allergies:  Allergies  Allergen Reactions  . Shellfish Allergy Swelling    Prescriptions prior to admission  Medication Sig Dispense Refill  . albuterol (PROVENTIL HFA;VENTOLIN HFA) 108 (90 BASE) MCG/ACT inhaler Inhale 2 puffs into the lungs every 6 (six) hours as needed. Shortness  of breath/asthma       . aspirin-acetaminophen-caffeine (EXCEDRIN MIGRAINE) 250-250-65 MG per tablet Take 2 tablets by mouth every 6 (six) hours as needed. For migraine.      . propranolol (INDERAL) 40 MG tablet Take 1 tablet (40 mg total) by mouth 3 (three) times daily.  90 tablet  0    Review of Systems  Constitutional: Positive for malaise/fatigue. Negative for fever and chills.  Eyes: Negative for blurred vision.  Respiratory: Negative for cough and shortness of breath.   Cardiovascular: Positive for chest pain. Negative for palpitations.  Gastrointestinal: Positive for abdominal pain. Negative for heartburn, nausea, vomiting, diarrhea and constipation.  Genitourinary: Negative for dysuria, urgency and frequency.  Musculoskeletal: Negative.   Neurological: Positive for headaches. Negative for dizziness.  Psychiatric/Behavioral: Negative for depression. The patient is nervous/anxious.    Physical Exam   Blood pressure 150/116, pulse 78, temperature 97.9 F (36.6 C), temperature source Oral, resp. rate 18, height 5\' 6"  (1.676 m), weight 82.101 kg (181 lb), last menstrual period 07/05/2011, not currently breastfeeding.  Physical Exam  Nursing note and vitals reviewed. Constitutional: She is oriented to person, place, and time. She appears well-developed and well-nourished.  Neck: Normal range of motion.  Cardiovascular: Normal rate, regular rhythm and normal heart sounds.   Respiratory: Effort normal and breath sounds normal.  GI: Soft. Bowel sounds are normal. She exhibits no distension and no mass. There is no tenderness. There is no rebound and no guarding.  Musculoskeletal: Normal range of motion.  Neurological: She is alert and oriented to person, place, and time.  Skin: Skin is warm and dry.  Psychiatric: She has a normal mood and affect. Her behavior is normal. Judgment and thought content normal.   EKG for CP:  Sinus bradycardia with HR 58  MAU Course  Procedures Pelvic  exam with wet prep, GC/Chlamydia, TSH, Free T4, Free T4, EKG  MGM Discussed plan of care with Dr Adrian Blackwater.  Pt has f/u plan with Family practice.   Assessment and Plan  Hypertension S/P RAI for Graves   D/C home with thyroid storm precautions HCTZ 25 mg daily Pt waiting for Medicaid and has filled out paperwork to see [facility] for primary care Endocrinology consult Return to MAU as needed  LEFTWICH-KIRBY, Hennessy Bartel 08/05/2011, 2:43 PM

## 2011-08-05 NOTE — Discharge Instructions (Signed)
Radioiodine (I-131) Therapy for Hyperthyroidism Radioiodine (I-131) therapy for hyperthyroidism is a procedure used to treat an overactive thyroid (hyperthyroidism). The patient swallows I-131, which is a radioactive form of iodine. The I-131 destroys thyroid cells. The thyroid is a gland in the neck. It makes thyroid hormones, which control how cells throughout the body use energy. Hyperthyroidism is usually caused by Grave's disease or by growths within the thyroid (nodules). Symptoms may include:  Nervousness.   Irritability.   Problems with sleep.   Tiredness.   Fast heart rate.   Shaky hands.   Sweating.   Heat sensitivity.   Unintended weight loss.   Brittle hair.   Enlarged thyroid gland.   Menstrual changes.   Frequent stools.  LET YOUR CAREGIVER KNOW ABOUT:   All allergies.   All medications that you are taking, including over-the-counter and prescription drugs, dietary supplements, vitamins, or herbal preparations.   Any previous complications from this or other procedures.   Smoking history.   Possibility of pregnancy.   History of bleeding problems.   Any other health problems.  RISKS AND COMPLICATIONS  Risks of the procedure include:  Slight pain in the area of the thyroid gland.  BEFORE THE PROCEDURE  If you are a woman, you may be asked to have pregnancy testing before the procedure.   If you have been taking thyroid medicines, you will usually be asked to stop them three days before your procedure.   You will usually be asked to stop eating and drinking at midnight the day of your procedure.   You will need to plan for someone to drive you home after the procedure.  PROCEDURE  You will be asked to swallow the radioactive iodine in either pill or liquid form. It can take 1-3 months for the treatment to work. The treatment is most effective at about 3-6 months after the dose of iodine has been given. In most people, a single dose of radioactive  iodine resolves their hyperthyroidism, but a few people will require a second dose.  AFTER THE PROCEDURE  Because you will be giving off tiny amounts of radiation for several days, there are some special precautions you will be asked to follow for about 2-4 days after the procedure:  Avoid being around babies or pregnant women.   Do not use public bathrooms.   Flush twice after using the toilet.   Take a bath or shower every day.   Practice good hand washing.   Drink fluids normally.   Use disposable utensils, or clean your utensils separately from those of others.   Sleep alone.   Do not engage in intimate contact.   Wash your sheets, towels, and clothes each day, by themselves.   Do not make food for other people.  Other precautions include:  Stopping breastfeeding.   Do not attempt to become pregnant for at least a year after you have had the procedure.   When traveling, bring a letter of explanation from your caregiver for three months. Radioactivity may trip detectors in airports or other places.  Because the point of the procedure is to destroy your thyroid gland, you will need to take thyroid hormone by mouth for the rest of your life. HOME CARE INSTRUCTIONS   Ask your caregiver when you should resume or start thyroid medications.   Take all medications exactly as directed.   Follow any prescribed diet.   Follow instructions regarding both rest and physical activity.  SEEK IMMEDIATE MEDICAL CARE IF:  You have a dry mouth.   You have a sore throat.   You have neck pain.   You have a tight sensation in the throat.   You have nausea and vomiting.   You are fatigued.   You have flushing.   You have bowel changes (either diarrhea or constipation).  Document Released: 08/22/2008 Document Revised: 03/25/2011 Document Reviewed: 08/22/2008 [facility] Patient Information 2012 [place], [place].  Hypertension As your heart beats, it forces blood through your  arteries. This force is your blood pressure. If the pressure is too high, it is called hypertension (HTN) or high blood pressure. HTN is dangerous because you may have it and not know it. High blood pressure may mean that your heart has to work harder to pump blood. Your arteries may be narrow or stiff. The extra work puts you at risk for heart disease, stroke, and other problems.  Blood pressure consists of two numbers, a higher number over a lower, 110/72, for example. It is stated as "110 over 72." The ideal is below 120 for the top number (systolic) and under 80 for the bottom (diastolic). Write down your blood pressure today. You should pay close attention to your blood pressure if you have certain conditions such as:  Heart failure.   Prior heart attack.   Diabetes   Chronic kidney disease.   Prior stroke.   Multiple risk factors for heart disease.  To see if you have HTN, your blood pressure should be measured while you are seated with your arm held at the level of the heart. It should be measured at least twice. A one-time elevated blood pressure reading (especially in the Emergency Department) does not mean that you need treatment. There may be conditions in which the blood pressure is different between your right and left arms. It is important to see your caregiver soon for a recheck. Most people have essential hypertension which means that there is not a specific cause. This type of high blood pressure may be lowered by changing lifestyle factors such as:  Stress.   Smoking.   Lack of exercise.   Excessive weight.   Drug/tobacco/alcohol use.   Eating less salt.  Most people do not have symptoms from high blood pressure until it has caused damage to the body. Effective treatment can often prevent, delay or reduce that damage. TREATMENT  When a cause has been identified, treatment for high blood pressure is directed at the cause. There are a large number of medications to  treat HTN. These fall into several categories, and your caregiver will help you select the medicines that are best for you. Medications may have side effects. You should review side effects with your caregiver. If your blood pressure stays high after you have made lifestyle changes or started on medicines,   Your medication(s) may need to be changed.   Other problems may need to be addressed.   Be certain you understand your prescriptions, and know how and when to take your medicine.   Be sure to follow up with your caregiver within the time frame advised (usually within two weeks) to have your blood pressure rechecked and to review your medications.   If you are taking more than one medicine to lower your blood pressure, make sure you know how and at what times they should be taken. Taking two medicines at the same time can result in blood pressure that is too low.  SEEK IMMEDIATE MEDICAL CARE IF:  You develop a  severe headache, blurred or changing vision, or confusion.   You have unusual weakness or numbness, or a faint feeling.   You have severe chest or abdominal pain, vomiting, or breathing problems.  MAKE SURE YOU:   Understand these instructions.   Will watch your condition.   Will get help right away if you are not doing well or get worse.  Document Released: 04/05/2005 Document Revised: 03/25/2011 Document Reviewed: 11/24/2007 [facility] Patient Information 2012 [place], [place].

## 2011-08-05 NOTE — MAU Note (Signed)
Back pain off and on past wk.  No appetite, nausea at times.  Stomach flutters.  Having headaches.  On depo, last was Feb.  Planning for tubal.  Having a 'black' discharge.

## 2011-08-06 LAB — GC/CHLAMYDIA PROBE AMP, GENITAL: GC Probe Amp, Genital: NEGATIVE

## 2011-08-06 NOTE — MAU Provider Note (Signed)
Chart reviewed and agree with management and plan.  

## 2011-08-09 ENCOUNTER — Encounter (HOSPITAL_COMMUNITY): Payer: Self-pay | Admitting: *Deleted

## 2011-08-09 ENCOUNTER — Emergency Department (HOSPITAL_COMMUNITY)
Admission: EM | Admit: 2011-08-09 | Discharge: 2011-08-09 | Disposition: A | Discharge status: Home / Self Care | Payer: Medicaid Other | Attending: Emergency Medicine | Admitting: Emergency Medicine

## 2011-08-09 DIAGNOSIS — E039 Hypothyroidism, unspecified: Secondary | ICD-10-CM

## 2011-08-09 DIAGNOSIS — Z8249 Family history of ischemic heart disease and other diseases of the circulatory system: Secondary | ICD-10-CM | POA: Insufficient documentation

## 2011-08-09 DIAGNOSIS — E0500 Thyrotoxicosis with diffuse goiter without thyrotoxic crisis or storm: Secondary | ICD-10-CM

## 2011-08-09 DIAGNOSIS — J45909 Unspecified asthma, uncomplicated: Secondary | ICD-10-CM | POA: Insufficient documentation

## 2011-08-09 DIAGNOSIS — F17200 Nicotine dependence, unspecified, uncomplicated: Secondary | ICD-10-CM | POA: Insufficient documentation

## 2011-08-09 MED ORDER — LEVOTHYROXINE SODIUM 75 MCG PO TABS: 75.0000 ug | ORAL_TABLET | Freq: Every day | ORAL | Status: DC

## 2011-08-09 MED ORDER — CEFAZOLIN SODIUM-DEXTROSE 2-3 GM-% IV SOLR
2.0000 g | INTRAVENOUS | Status: DC
  Filled 2011-08-09: qty 50

## 2011-08-09 NOTE — ED Provider Notes (Signed)
History     CSN: [id number]  Arrival date & time 08/09/11  1547   First MD Initiated Contact with Patient 08/09/11 1703      Chief Complaint  Patient presents with  . Generalized Body Aches    (Consider location/radiation/quality/duration/timing/severity/associated sxs/prior treatment) The history is provided by the patient.   patient reports generalized myalgias for approximately one week.  In November of 2012 she was diagnosed with Graves' disease and was hospitalized at that time.  She underwent radioactive iodine but was lost to followup.  She's not on Synthroid.  She never followed up with the endocrinologist as she was instructed to do so.  She denies fevers or chills.  She denies chest pain or shortness of breath.  She has no palpitations.  She denies syncope or near-syncope.  She was seen at [facility] 4 days ago for the same and was told that she had "thyroid problems" but was not prescribe any medications.  She was told to come to the ER instead.  She was now referred to anyone.  She is attempting to get followup with the family practice Center here at [facility] but his been unable to secondary to insurance issues.  She believes this will be cleared up soon  Past Medical History  Diagnosis Date  . Anemia     all 3 pregnancies  . Hyperemesis     3rd pregnancy  . Gestational hypertension 2010    with 3rd pregnancy  . Preterm labor     with 3rd pregnancy  . Asthma     albuterol inhaler, last used one year ago  . Thyroid disease     had radioactive iodine  . Grave's disease     Past Surgical History  Procedure Date  . Dilation and curettage of uterus 2010    Family History  Problem Relation Age of Onset  . Hypertension Father   . Hypertension Mother   . Anemia Mother   . Anesthesia problems Neg Hx   . Hypotension Neg Hx   . Malignant hyperthermia Neg Hx   . Pseudochol deficiency Neg Hx     History  Substance Use Topics  . Smoking status: Current  Everyday Smoker -- 0.2 packs/day for .5 years    Types: Cigarettes  . Smokeless tobacco: Never Used  . Alcohol Use: No    OB History    Grav Para Term Preterm Abortions TAB SAB Ect Mult Living   5 4 3 1 1  1   4       Review of Systems  All other systems reviewed and are negative.    Allergies  Shellfish allergy  Home Medications   Current Outpatient Rx  Name Route Sig Dispense Refill  . ALBUTEROL SULFATE HFA 108 (90 BASE) MCG/ACT IN AERS Inhalation Inhale 2 puffs into the lungs every 6 (six) hours as needed. Shortness of breath/asthma     . ASPIRIN-ACETAMINOPHEN-CAFFEINE 250-250-65 MG PO TABS Oral Take 2 tablets by mouth every 6 (six) hours as needed. For migraine.    Marland Kitchen HYDROCHLOROTHIAZIDE 25 MG PO TABS Oral Take 25 mg by mouth daily.    Marland Kitchen FLINSTONES GUMMIES OMEGA-3 DHA PO Oral Take 2 tablets by mouth daily.      BP 119/90  Pulse 77  Temp 98.7 F (37.1 C)  Resp 16  SpO2 98%  LMP 07/05/2011  Breastfeeding? No  Physical Exam  Nursing note and vitals reviewed. Constitutional: She is oriented to person, place, and time. She appears  well-developed and well-nourished. No distress.  HENT:  Head: Normocephalic and atraumatic.  Eyes: EOM are normal.  Neck: Normal range of motion.  Cardiovascular: Normal rate, regular rhythm and normal heart sounds.   Pulmonary/Chest: Effort normal and breath sounds normal.  Abdominal: Soft. She exhibits no distension. There is no tenderness.  Musculoskeletal: Normal range of motion.  Neurological: She is alert and oriented to person, place, and time.  Skin: Skin is warm and dry.  Psychiatric: She has a normal mood and affect. Judgment normal.    ED Course  Procedures (including critical care time)  Labs Reviewed - No data to display No results found.   1. Grave's disease   2. Hypothyroid       MDM  Pt with hypothyroidism s/p radioactive idodine treatment. Not on synthroid. TSH 116 and free t4 0.35 four days ago at OSH.   Vital signs are normal.  Recommended endocrinology and PCP followup.  I will start the patient on Synthroid today        Lyanne Co, MD 08/09/11 [phone]

## 2011-08-09 NOTE — Discharge Instructions (Signed)

## 2011-08-09 NOTE — ED Notes (Signed)
Pt. Denies any cold symptoms or fevers, chills. Also denies any n/v/d

## 2011-08-09 NOTE — ED Notes (Signed)
To ED for eval of generalized body aches. States she was seen at [facility] last week for same. Told she had 'thyroid problems' but unsure if over or under active. States since, body aches. Appears in nad.

## 2011-08-09 NOTE — ED Notes (Signed)
Pt. Reports that she was not lifting she just woke up with generalized lt. Side body pain and aches

## 2011-08-09 NOTE — ED Notes (Signed)
Pt. Reports having lt.side muscular pain from her lt. Side of her head to her foot.  She denies any injuries and was at [facility] last week and the felt it could be contributed to thyroid, but is not sure which under or over

## 2011-08-10 ENCOUNTER — Encounter (HOSPITAL_COMMUNITY): Payer: Self-pay | Admitting: Anesthesiology

## 2011-08-10 ENCOUNTER — Ambulatory Visit (HOSPITAL_COMMUNITY): Payer: Medicaid Other | Admitting: Anesthesiology

## 2011-08-10 ENCOUNTER — Ambulatory Visit (HOSPITAL_COMMUNITY): Admission: AD | Admit: 2011-08-10 | Payer: Self-pay | Source: Ambulatory Visit | Admitting: Obstetrics & Gynecology

## 2011-08-10 ENCOUNTER — Encounter (HOSPITAL_COMMUNITY)
Admission: RE | Disposition: A | Discharge status: Home / Self Care | Payer: Self-pay | Source: Ambulatory Visit | Attending: Obstetrics & Gynecology

## 2011-08-10 ENCOUNTER — Ambulatory Visit (HOSPITAL_COMMUNITY)
Admission: RE | Admit: 2011-08-10 | Discharge: 2011-08-10 | Disposition: A | Discharge status: Home / Self Care | Payer: Medicaid Other | Source: Ambulatory Visit | Attending: Obstetrics & Gynecology | Admitting: Obstetrics & Gynecology

## 2011-08-10 ENCOUNTER — Encounter (HOSPITAL_COMMUNITY): Payer: Self-pay | Admitting: *Deleted

## 2011-08-10 DIAGNOSIS — Z302 Encounter for sterilization: Secondary | ICD-10-CM

## 2011-08-10 HISTORY — PX: LAPAROSCOPIC TUBAL LIGATION: SHX1937

## 2011-08-10 LAB — SURGICAL PCR SCREEN
MRSA, PCR: POSITIVE — AB
Staphylococcus aureus: POSITIVE — AB

## 2011-08-10 LAB — BASIC METABOLIC PANEL
Calcium: 9.8 mg/dL (ref 8.4–10.5)
GFR calc Af Amer: 68 mL/min — ABNORMAL LOW (ref >90)
GFR calc non Af Amer: 59 mL/min — ABNORMAL LOW (ref >90)
Glucose, Bld: 89 mg/dL (ref 70–99)
Potassium: 3.1 mEq/L — ABNORMAL LOW (ref 3.5–5.1)
Sodium: 135 mEq/L (ref 135–145)

## 2011-08-10 LAB — PREGNANCY, URINE: Preg Test, Ur: NEGATIVE

## 2011-08-10 LAB — CBC
MCV: 88.8 fL (ref 78.0–100.0)
Platelets: 342 10*3/uL (ref 150–400)
RBC: 4.98 MIL/uL (ref 3.87–5.11)
RDW: 14.7 % (ref 11.5–15.5)
WBC: 5.9 10*3/uL (ref 4.0–10.5)

## 2011-08-10 SURGERY — LIGATION, FALLOPIAN TUBE, LAPAROSCOPIC
Anesthesia: Choice | Site: Abdomen | Laterality: Bilateral

## 2011-08-10 SURGERY — LIGATION, FALLOPIAN TUBE, LAPAROSCOPIC
Anesthesia: General | Site: Abdomen | Laterality: Bilateral | Wound class: Clean Contaminated

## 2011-08-10 MED ORDER — MIDAZOLAM HCL 5 MG/5ML IJ SOLN
INTRAMUSCULAR | Status: DC | PRN
  Administered 2011-08-10: 2 mg via INTRAVENOUS

## 2011-08-10 MED ORDER — IBUPROFEN 600 MG PO TABS: 600.0000 mg | ORAL_TABLET | Freq: Four times a day (QID) | ORAL | Status: AC | PRN

## 2011-08-10 MED ORDER — KETOROLAC TROMETHAMINE 30 MG/ML IJ SOLN
INTRAMUSCULAR | Status: DC | PRN
  Administered 2011-08-10: 30 mg via INTRAVENOUS

## 2011-08-10 MED ORDER — PROMETHAZINE HCL 25 MG/ML IJ SOLN: 6.2500 mg | INTRAMUSCULAR | Status: DC | PRN

## 2011-08-10 MED ORDER — PROPOFOL 10 MG/ML IV EMUL
INTRAVENOUS | Status: AC
  Filled 2011-08-10: qty 20

## 2011-08-10 MED ORDER — LIDOCAINE HCL (CARDIAC) 20 MG/ML IV SOLN
INTRAVENOUS | Status: DC | PRN
  Administered 2011-08-10: 60 mg via INTRAVENOUS

## 2011-08-10 MED ORDER — NEOSTIGMINE METHYLSULFATE 1 MG/ML IJ SOLN
INTRAMUSCULAR | Status: DC | PRN
  Administered 2011-08-10: 3 mg via INTRAVENOUS

## 2011-08-10 MED ORDER — MEPERIDINE HCL 25 MG/ML IJ SOLN: 6.2500 mg | INTRAMUSCULAR | Status: DC | PRN

## 2011-08-10 MED ORDER — MUPIROCIN 2 % EX OINT
TOPICAL_OINTMENT | Freq: Two times a day (BID) | CUTANEOUS | Status: DC
  Administered 2011-08-10: 1 via NASAL

## 2011-08-10 MED ORDER — LIDOCAINE HCL (CARDIAC) 20 MG/ML IV SOLN
INTRAVENOUS | Status: AC
  Filled 2011-08-10: qty 5

## 2011-08-10 MED ORDER — FENTANYL CITRATE 0.05 MG/ML IJ SOLN
INTRAMUSCULAR | Status: DC | PRN
  Administered 2011-08-10: 100 ug via INTRAVENOUS

## 2011-08-10 MED ORDER — GLYCOPYRROLATE 0.2 MG/ML IJ SOLN
INTRAMUSCULAR | Status: DC | PRN
  Administered 2011-08-10: 0.4 mg via INTRAVENOUS

## 2011-08-10 MED ORDER — DEXAMETHASONE SODIUM PHOSPHATE 10 MG/ML IJ SOLN
INTRAMUSCULAR | Status: DC | PRN
  Administered 2011-08-10: 10 mg via INTRAVENOUS

## 2011-08-10 MED ORDER — METOCLOPRAMIDE HCL 5 MG/ML IJ SOLN
INTRAMUSCULAR | Status: AC
  Administered 2011-08-10: 10 mg via INTRAVENOUS
  Filled 2011-08-10: qty 2

## 2011-08-10 MED ORDER — ONDANSETRON HCL 4 MG/2ML IJ SOLN
INTRAMUSCULAR | Status: DC | PRN
  Administered 2011-08-10: 4 mg via INTRAVENOUS

## 2011-08-10 MED ORDER — FENTANYL CITRATE 0.05 MG/ML IJ SOLN
INTRAMUSCULAR | Status: AC
  Filled 2011-08-10: qty 5

## 2011-08-10 MED ORDER — GLYCOPYRROLATE 0.2 MG/ML IJ SOLN
INTRAMUSCULAR | Status: AC
  Filled 2011-08-10: qty 1

## 2011-08-10 MED ORDER — DEXAMETHASONE SODIUM PHOSPHATE 10 MG/ML IJ SOLN
INTRAMUSCULAR | Status: AC
  Filled 2011-08-10: qty 1

## 2011-08-10 MED ORDER — LACTATED RINGERS IV SOLN
INTRAVENOUS | Status: DC
  Administered 2011-08-10 (×2): via INTRAVENOUS

## 2011-08-10 MED ORDER — FENTANYL CITRATE 0.05 MG/ML IJ SOLN
25.0000 ug | INTRAMUSCULAR | Status: DC | PRN
  Administered 2011-08-10: 50 ug via INTRAVENOUS

## 2011-08-10 MED ORDER — KETOROLAC TROMETHAMINE 30 MG/ML IJ SOLN: 15.0000 mg | Freq: Once | INTRAMUSCULAR | Status: DC | PRN

## 2011-08-10 MED ORDER — OXYCODONE-ACETAMINOPHEN 5-325 MG PO TABS: 1.0000 | ORAL_TABLET | Freq: Four times a day (QID) | ORAL | Status: AC | PRN

## 2011-08-10 MED ORDER — NEOSTIGMINE METHYLSULFATE 1 MG/ML IJ SOLN
INTRAMUSCULAR | Status: AC
  Filled 2011-08-10: qty 10

## 2011-08-10 MED ORDER — FENTANYL CITRATE 0.05 MG/ML IJ SOLN
INTRAMUSCULAR | Status: AC
  Administered 2011-08-10: 25 ug via INTRAVENOUS
  Filled 2011-08-10: qty 2

## 2011-08-10 MED ORDER — METOCLOPRAMIDE HCL 5 MG/ML IJ SOLN
10.0000 mg | INTRAMUSCULAR | Status: AC
  Administered 2011-08-10: 10 mg via INTRAVENOUS

## 2011-08-10 MED ORDER — DOCUSATE SODIUM 100 MG PO CAPS: 100.0000 mg | ORAL_CAPSULE | Freq: Two times a day (BID) | ORAL | Status: AC | PRN

## 2011-08-10 MED ORDER — ROCURONIUM BROMIDE 100 MG/10ML IV SOLN
INTRAVENOUS | Status: DC | PRN
  Administered 2011-08-10: 20 mg via INTRAVENOUS
  Administered 2011-08-10: 10 mg via INTRAVENOUS

## 2011-08-10 MED ORDER — ONDANSETRON HCL 4 MG/2ML IJ SOLN
INTRAMUSCULAR | Status: AC
  Filled 2011-08-10: qty 2

## 2011-08-10 MED ORDER — BUPIVACAINE HCL (PF) 0.25 % IJ SOLN
INTRAMUSCULAR | Status: DC | PRN
  Administered 2011-08-10: 30 mL

## 2011-08-10 MED ORDER — MIDAZOLAM HCL 2 MG/2ML IJ SOLN
INTRAMUSCULAR | Status: AC
  Filled 2011-08-10: qty 2

## 2011-08-10 MED ORDER — PROPOFOL 10 MG/ML IV EMUL
INTRAVENOUS | Status: DC | PRN
  Administered 2011-08-10: 160 mg via INTRAVENOUS

## 2011-08-10 MED ORDER — FENTANYL CITRATE 0.05 MG/ML IJ SOLN
25.0000 ug | INTRAMUSCULAR | Status: DC | PRN
  Administered 2011-08-10: 25 ug via INTRAVENOUS

## 2011-08-10 SURGICAL SUPPLY — 17 items
CATH ROBINSON RED A/P 16FR (CATHETERS) ×2 IMPLANT
CHLORAPREP W/TINT 26ML (MISCELLANEOUS) ×2 IMPLANT
CLIP FILSHIE TUBAL LIGA STRL (Clip) ×2 IMPLANT
CLOTH BEACON ORANGE TIMEOUT ST (SAFETY) ×2 IMPLANT
DRAPE PROXIMA HALF (DRAPES) ×2 IMPLANT
DRSG COVADERM PLUS 2X2 (GAUZE/BANDAGES/DRESSINGS) ×2 IMPLANT
GLOVE BIO SURGEON STRL SZ7 (GLOVE) ×2 IMPLANT
GLOVE BIOGEL PI IND STRL 7.0 (GLOVE) ×2 IMPLANT
GLOVE BIOGEL PI INDICATOR 7.0 (GLOVE) ×2
GOWN PREVENTION PLUS LG XLONG (DISPOSABLE) ×4 IMPLANT
NEEDLE INSUFFLATION 14GA 120MM (NEEDLE) ×2 IMPLANT
PACK LAPAROSCOPY BASIN (CUSTOM PROCEDURE TRAY) ×2 IMPLANT
SUT VIC AB 3-0 X1 27 (SUTURE) ×2 IMPLANT
SUT VICRYL 0 UR6 27IN ABS (SUTURE) ×4 IMPLANT
TOWEL OR 17X24 6PK STRL BLUE (TOWEL DISPOSABLE) ×4 IMPLANT
TROCAR Z-THREAD FIOS 11X100 BL (TROCAR) ×2 IMPLANT
WATER STERILE IRR 1000ML POUR (IV SOLUTION) ×2 IMPLANT

## 2011-08-10 NOTE — Anesthesia Postprocedure Evaluation (Signed)
Anesthesia Post Note  Patient: [name]  Procedure(s) Performed: Procedure(s) (LRB): LAPAROSCOPIC TUBAL LIGATION (Bilateral)  Anesthesia type: General  Patient location: PACU  Post pain: Pain level controlled  Post assessment: Post-op Vital signs reviewed  Last Vitals:  Filed Vitals:   08/10/11 0952  BP:   Pulse: 58  Temp: 37 C  Resp: 24    Post vital signs: Reviewed  Level of consciousness: sedated  Complications: No apparent anesthesia complicationsfj

## 2011-08-10 NOTE — Progress Notes (Signed)
Ambulating patient to restroom to get dressed.  C/o nausea and began belching.  Placed back on stretcher.  Dr Arby Barrette notified and order received for Reglan IV.  IVF infusing and Reglan given.

## 2011-08-10 NOTE — Transfer of Care (Signed)
Immediate Anesthesia Transfer of Care Note  Patient: [name]  Procedure(s) Performed: Procedure(s) (LRB): LAPAROSCOPIC TUBAL LIGATION (Bilateral)  Patient Location: PACU  Anesthesia Type: General  Level of Consciousness: sedated and patient cooperative  Airway & Oxygen Therapy: Patient Spontanous Breathing and Patient connected to nasal cannula oxygen  Post-op Assessment: Report given to PACU RN and Post -op Vital signs reviewed and stable  Post vital signs: stable  Complications: No apparent anesthesia complications

## 2011-08-10 NOTE — Op Note (Signed)
[name] 08/10/2011  PREOPERATIVE DIAGNOSIS:  Undesired fertility  POSTOPERATIVE DIAGNOSIS:  Undesired fertility  PROCEDURE:  Laparoscopic Bilateral Tubal Sterilization using Filshie Clips   SURGEON: Jaynie Collins, MD  ANESTHESIA:  General endotracheal  COMPLICATIONS:  None immediate.  ESTIMATED BLOOD LOSS:  Less than 20 ml.  FLUIDS: 1500 ml LR.  URINE OUTPUT:  30 ml of clear urine.  INDICATIONS: 26 y.o. [id number] with complex medical history and undesired fertility, desires permanent sterilization. Other reversible forms of contraception were discussed with patient; she declines all other modalities.  Risks of procedure discussed with patient including permanence of method, bleeding, infection, injury to surrounding organs and need for additional procedures including laparotomy, risk of regret.  Failure risk of 0.5-1% with increased risk of ectopic gestation if pregnancy occurs was also discussed with patient.      FINDINGS:  Normal uterus, tubes, and ovaries.  TECHNIQUE:  The patient was taken to the operating room where general anesthesia was obtained without difficulty.  She was then placed in the dorsal lithotomy position and prepared and draped in sterile fashion.  After an adequate timeout was performed, a bivalved speculum was then placed in the patient's vagina, and the anterior lip of cervix grasped with the single-tooth tenaculum.  The uterine manipulator was then advanced into the uterus.  The speculum was removed from the vagina.  Attention was then turned to the patient's abdomen where a 10-mm skin incision was made on the umbilical fold.  The Veress needle was carefully introduced into the peritoneal cavity through the abdominal wall.  Intraperitoneal placement was confirmed by drop in intraabdominal pressure with insufflation of carbon dioxide gas.  Adequate pneumoperitoneum was obtained, and the 10-mm trocar and sleeve were then advanced without difficulty into the  abdomen where intraabdominal placement was confirmed by the operative laparoscope. A survey of the patient's pelvis and abdomen revealed entirely normal anatomy.  The fallopian tubes were observed and found to be normal in appearance. The Filshie clip applicator was placed through the operative port, and a Filshie clip was placed on the right fallopian tube ,about 2 cm from the cornual attachment, with care given to incorporate the underlying mesosalpinx.  A similar process was carried out on the contralateral side allowing for bilateral tubal sterilization.   Good hemostasis was noted overall.  Local analgesia was drizzled on both operative sites.The instruments were then removed from the patient's abdomen and the fascial incision was repaired with 0 Vicryl, and the skin was closed with 3-0 Vicryl subcuticular stitch.  The uterine manipulator and the tenaculum were removed from the vagina without complications. The patient tolerated the procedure well.  Sponge, lap, and needle counts were correct times two.  The patient was then taken to the recovery room awake, extubated and in stable condition.  The patient will be discharged to home as per PACU criteria.  Routine postoperative instructions given.  She was prescribed Percocet, Ibuprofen and Colace.  She was given a letter excusing her from work until August 16, 2011.  She will follow up in the clinic in about 4 weeks for postoperative evaluation .

## 2011-08-10 NOTE — Discharge Instructions (Signed)
Laparoscopic Tubal Ligation Laparoscopic tubal ligation is a procedure that closes the fallopian tubes. Tubal ligation is also known as getting your "tubes tied." It is a brief, common and relatively simple surgical procedure. Tubal ligation is done to cause sterilization. Sterilization means you will not be able to get pregnant or have a baby. Sometimes a tubal ligation may be reversed, but this should not be considered a possibility because of failure to get pregnant and the increased risk of tubal (ectopic) pregnancy. If you want to have future pregnancies, you should not have this procedure. Use other forms of contraception. Tubal ligation can be done at any time during your menstrual cycle. This procedure has a less than 1% failure rate. Tubal ligation does not protect against sexually transmitted disease. LET YOUR CAREGIVER KNOW ABOUT:  Allergies, especially to medicines.   All the medicines you are taking, even herbs, eyedrops, steroids, creams, and over-the-counter drugs.   The possibility of being pregnant.   Past problems with medicines.   History of blood clots or other blood problems.   Past surgeries, medical or health problems.  RISKS AND COMPLICATIONS  Your caregiver will discuss the risks with you before the procedure. Some problems that can happen after this procedure include:  Infection. A germ starts growing in one of the wounds. This can usually be treated with antibiotic medicines.   Bleeding following surgery. Your surgeon takes every precaution to keep this from happening.   Damage to other organs. If damage to other organs or excessive bleeding should occur, it may be necessary to convert the laparoscopic procedure into an open abdominal procedure. Scarring (adhesions) from past surgeries or disease may also be a cause to change this procedure to an open abdominal operation.   Anesthetic side effects.  BEFORE THE PROCEDURE  Do not take aspirin or blood thinners a  week before the procedure. This can cause bleeding. Do as directed by your caregiver.   Do not eat or drink anything 6 to 8 hours before the procedure.   Let your caregiver know if you get a cold or an infection before the procedure.   Arrive at the clinic or hospital 60 minutes before the surgery or as directed.  PROCEDURE   You may be given a mild drug (sedative) to help you relax before the procedure. Once in the operating room, you will be given a general anesthetic to make you sleep (unless you and your caregiver choose a different anesthetic).   Once you are sleeping, your surgeon makes your abdomen larger with a harmless gas (carbon dioxide). This makes your organs easier to see and gives room to operate.   A laparoscope is then inserted into your abdomen through a small cut (incision) below the navel. A laparoscope is a thin, lighted, pencil-sized instrument. It is like a telescope. Once inserted, your caregiver can look at the organs inside your abdomen. He or she can see if there is anything abnormal.   Other small instruments also are put into the abdomen through other small openings (portals). Many surgeons attach a video camera to the laparoscope to enlarge the view. The fallopian tubes are located and either burned closed (cauterized) or a plastic clip is placed on them to close the tubes.  Sometimes, your surgeon may take tissue samples (biopsies) from other organs if he or she sees something abnormal. Biopsies can help to diagnose or confirm a disease. The samples are examined under a microscope. AFTER THE PROCEDURE   The gas   is released from inside your abdomen.   Your incisions are closed with stitches (sutures). Because these incisions are small (usually less than  inch), there is usually little discomfort following the procedure.   You will rest in a recovery room until you are stable and doing well.   As long as there are no problems, you may be allowed to go home.    Someone will need to drive you home.   You will have some mild discomfort in the throat. This is from the tube placed in your throat while you were sleeping.   You may experience discomfort in the shoulder area from some trapped air between the liver and diaphragm. This will slowly go away on its own.   You will also have some mild abdominal discomfort. You will also have discomfort from the incisions where the instruments were placed into your abdomen.  HOME CARE INSTRUCTIONS   Only take over-the-counter or prescription medicines for pain, discomfort, or fever as directed by your caregiver. Do not take aspirin. It can cause bleeding.   Resume daily activities, diet, rest, driving, and work as directed.   Showers are preferred over baths.   Resume sexual activities in 1 week or as directed.   If biopsies were taken, find out how to get your results. The results are usually given during the follow-up visit. Do not assume tests are normal if you do not hear from your caregiver. It is your responsibility to obtain your results.   Change the dressings as directed.   It is helpful to have someone with you for several hours after you go home. They can help you and be available if you have problems.     DISCHARGE INSTRUCTIONS: Laparoscopic Tubal Ligation  *No Ibuprofen containing medications until after 4:00 pm today*  Return to your doctor's office for a follow-up visit in 4 weeks.  The following instructions have been prepared to help you care for yourself upon your return home today.  Wound care: Marland Kitchen Do not get the incision wet for the first 24 hours. The incision should be kept clean and dry. . The Band-Aids or dressings may be removed the day after surgery. . Should the incision become sore, red, and swollen after the first week, check with your doctor.  Personal hygiene: . Shower the day after your procedure.  Activity and limitations: . Do NOT drive or operate any  equipment today. . Do NOT lift anything more than 15 pounds for 2-3 weeks after surgery. . Do NOT rest in bed all day. . Walking is encouraged. Walk each day, starting slowly with 5-minute walks 3 or 4 times a day. Slowly increase the length of your walks. . Walk up and down stairs slowly. . Do NOT do strenuous activities, such as golfing, playing tennis, bowling, running, biking, weight lifting, gardening, mowing, or vacuuming for 2-4 weeks. Ask your doctor when it is okay to start.  Diet: Eat a light meal as desired this evening. You may resume your usual diet tomorrow.  Return to work: This is dependent on the type of work you do. For the most part you can return to a desk job within a week of surgery. If you are more active at work, please discuss this with your doctor.  What to expect after your surgery: You may have a slight burning sensation when you urinate on the first day. You may have a very small amount of blood in the urine. Expect to have a  small amount of vaginal discharge/light bleeding for 1-2 weeks. It is not unusual to have abdominal soreness and bruising for up to 2 weeks. You may be tired and need more rest for about 1 week. You may experience shoulder pain for 24-72 hours. Lying flat in bed may relieve it.  Call your doctor for any of the following: . Develop a fever of 100.4 or greater . Inability to urinate 6 hours after discharge from hospital . Severe pain not relieved by pain medications . Persistent of heavy bleeding at incision site . Redness or swelling around incision site after a week . Increasing nausea or vomiting  Patient Signature________________________________________ Nurse Signature_________________________________________

## 2011-08-10 NOTE — Anesthesia Preprocedure Evaluation (Signed)
Anesthesia Evaluation  Patient identified by MRN, date of birth, ID band Patient awake    Reviewed: Allergy & Precautions, H&P , NPO status , Patient's Chart, lab work & pertinent test results, reviewed documented beta blocker date and time   Airway Mallampati: I TM Distance: >3 FB Neck ROM: full    Dental No notable dental hx. (+) Teeth Intact   Pulmonary  breath sounds clear to auscultation  Pulmonary exam normal       Cardiovascular     Neuro/Psych negative neurological ROS  negative psych ROS   GI/Hepatic negative GI ROS, Neg liver ROS,   Endo/Other  Hypothyroidism   Renal/GU negative Renal ROS  negative genitourinary   Musculoskeletal negative musculoskeletal ROS (+)   Abdominal Normal abdominal exam  (+)   Peds negative pediatric ROS (+)  Hematology negative hematology ROS (+)   Anesthesia Other Findings   Reproductive/Obstetrics negative OB ROS                           Anesthesia Physical Anesthesia Plan  ASA: II  Anesthesia Plan: General   Post-op Pain Management:    Induction: Intravenous  Airway Management Planned: Oral ETT  Additional Equipment:   Intra-op Plan:   Post-operative Plan: Extubation in OR  Informed Consent: I have reviewed the patients History and Physical, chart, labs and discussed the procedure including the risks, benefits and alternatives for the proposed anesthesia with the patient or authorized representative who has indicated his/her understanding and acceptance.   Dental Advisory Given  Plan Discussed with: CRNA and Surgeon  Anesthesia Plan Comments:         Anesthesia Quick Evaluation

## 2011-08-10 NOTE — H&P (Signed)
Preoperative History and Physical  [name] is a 26 y.o. Z6X0960 here for surgical management of undesired fertility.  Proposed surgery: Laparoscopic bilateral tubal sterilization using Filshie clips  PMH:    Past Medical History  Diagnosis Date  . Anemia     all 3 pregnancies  . Hyperemesis     3rd pregnancy  . Gestational hypertension 2010    with 3rd pregnancy  . Preterm labor     with 3rd pregnancy  . Asthma     albuterol inhaler, last used one year ago  . Thyroid disease     had radioactive iodine  . Grave's disease    PSH:     Past Surgical History  Procedure Date  . Dilation and curettage of uterus 2010   POb/GynH:   OB History    Grav Para Term Preterm Abortions TAB SAB Ect Mult Living   5 4 3 1 1  1   4     Patient denies any cervical dysplasia or STIs.  Medications: Current facility-administered medications:lactated ringers infusion, , Intravenous, Continuous, Tereso Newcomer, MD, Last Rate: 125 mL/hr at 08/10/11 0827;  mupirocin ointment (BACTROBAN) 2 %, , Nasal, BID, Tereso Newcomer, MD, 1 application at 08/10/11 0827 Facility-Administered Medications Ordered in Other Encounters: DISCONTD: ceFAZolin (ANCEF) IVPB 2 g/50 mL premix, 2 g, Intravenous, On Call to OR, Tereso Newcomer, MD  Allergies:  Allergies  Allergen Reactions  . Shellfish Allergy Swelling   SH:   History  Substance Use Topics  . Smoking status: Current Everyday Smoker -- 0.2 packs/day for .5 years    Types: Cigarettes  . Smokeless tobacco: Never Used  . Alcohol Use: No   FH:    Family History  Problem Relation Age of Onset  . Hypertension Father   . Hypertension Mother   . Anemia Mother   . Anesthesia problems Neg Hx   . Hypotension Neg Hx   . Malignant hyperthermia Neg Hx   . Pseudochol deficiency Neg Hx    Review of Systems: Noncontributory  PHYSICAL EXAM: Blood pressure 134/95, pulse 96, temperature 97.9 F (36.6 C), temperature source Oral, resp. rate 18, last  menstrual period 07/05/2011, SpO2 99.00%, not currently breastfeeding. General appearance - alert, well appearing, and in no distress Chest - clear to auscultation, no wheezes, rales or rhonchi, symmetric air entry Heart - normal rate and regular rhythm Abdomen - soft, nontender, nondistended, no masses or organomegaly Pelvic - examination not indicated Extremities - peripheral pulses normal, no pedal edema, no clubbing or cyanosis  Labs: Recent Results (from the past 336 hour(s))  URINALYSIS, ROUTINE W REFLEX MICROSCOPIC   Collection Time   08/05/11  1:30 PM      Component Value Range   Color, Urine YELLOW  YELLOW    APPearance CLEAR  CLEAR    Specific Gravity, Urine >1.030 (*) 1.005 - 1.030    pH 6.0  5.0 - 8.0    Glucose, UA NEGATIVE  NEGATIVE (mg/dL)   Hgb urine dipstick SMALL (*) NEGATIVE    Bilirubin Urine NEGATIVE  NEGATIVE    Ketones, ur NEGATIVE  NEGATIVE (mg/dL)   Protein, ur 30 (*) NEGATIVE (mg/dL)   Urobilinogen, UA 0.2  0.0 - 1.0 (mg/dL)   Nitrite NEGATIVE  NEGATIVE    Leukocytes, UA NEGATIVE  NEGATIVE   URINE MICROSCOPIC-ADD ON   Collection Time   08/05/11  1:30 PM      Component Value Range   Squamous Epithelial / LPF FEW (*)  RARE    Bacteria, UA FEW (*) RARE    Urine-Other MUCOUS PRESENT    POCT PREGNANCY, URINE   Collection Time   08/05/11  1:37 PM      Component Value Range   Preg Test, Ur NEGATIVE  NEGATIVE   GC/CHLAMYDIA PROBE AMP, GENITAL   Collection Time   08/05/11  2:24 PM      Component Value Range   GC Probe Amp, Genital NEGATIVE  NEGATIVE    Chlamydia, DNA Probe NEGATIVE  NEGATIVE   WET PREP, GENITAL   Collection Time   08/05/11  2:24 PM      Component Value Range   Yeast Wet Prep HPF POC NONE SEEN  NONE SEEN    Trich, Wet Prep NONE SEEN  NONE SEEN    Clue Cells Wet Prep HPF POC NONE SEEN  NONE SEEN    WBC, Wet Prep HPF POC MANY (*) NONE SEEN   TSH   Collection Time   08/05/11  2:37 PM      Component Value Range   TSH 116.797 (*) 0.350 -  4.500 (uIU/mL)  T4, FREE   Collection Time   08/05/11  2:37 PM      Component Value Range   Free T4 0.35 (*) 0.80 - 1.80 (ng/dL)  T3, FREE   Collection Time   08/05/11  2:37 PM      Component Value Range   T3, Free 0.3 (*) 2.3 - 4.2 (pg/mL)   Assessment: Patient Active Problem List  Diagnoses  . Anemia  . Late prenatal care  . Asthma  . Previous pregnancy complicated by pregnancy-induced hypertension, antepartum  . Previous preterm delivery, antepartum  . Transient hypertension of pregnancy, antepartum  . Postpartum thyroiditis  . Tachycardia  . Grave's disease  . Desires sterilization    Plan: Patient desires permanent sterilization with laparoscopic bilateral tubal sterilization using Filshie clips.  Other reversible forms of contraception were discussed with patient; she declines all other modalities. Risks of procedure discussed with patient including but not limited to: risk of regret, permanence of method, bleeding, infection, injury to surrounding organs and need for additional procedures.  Failure risk of 0.5-1% with increased risk of ectopic gestation if pregnancy occurs was also discussed with patient.   Patient verbalized understanding of these risks and wants to proceed with sterilization.  Written informed consent obtained.  Medicaid papers signed 06/24/11. To OR when ready.  Jaynie Collins, M.D. 08/10/2011 8:31 AM

## 2011-08-10 NOTE — OR Nursing (Signed)
Filshie Clips implanted @ bilateral fallopian tubes by Dr. Macon Large in OR suite. Serial number [id number]. Exp. Date 10-17-13.

## 2011-08-11 ENCOUNTER — Encounter (HOSPITAL_COMMUNITY): Payer: Self-pay | Admitting: Obstetrics & Gynecology

## 2011-09-08 ENCOUNTER — Telehealth: Payer: Self-pay

## 2011-09-08 NOTE — Telephone Encounter (Signed)
Pt called and stated that she had an esure procedure a month ago and is continuing to bleed "I am sick of it".  Can someone please give me a call back?

## 2011-09-09 NOTE — Telephone Encounter (Signed)
Called pt and left message that we are returning your call to please call the clinics.  

## 2011-09-15 ENCOUNTER — Telehealth: Payer: Self-pay | Admitting: *Deleted

## 2011-09-15 DIAGNOSIS — N938 Other specified abnormal uterine and vaginal bleeding: Secondary | ICD-10-CM

## 2011-09-15 MED ORDER — MEDROXYPROGESTERONE ACETATE 10 MG PO TABS: 20.0000 mg | ORAL_TABLET | Freq: Every day | ORAL | Status: DC

## 2011-09-15 NOTE — Telephone Encounter (Signed)
Called pt and left message that I was checking to see if she is still having problems or questions. Also, she needs a follow up appt in the clinic from her surgical procedure 1 month ago.  Please call to schedule the appt and she may leave a new voice mail message for the nurse if she still has questions right now.

## 2011-09-15 NOTE — Telephone Encounter (Signed)
Patient call was routed to me from the front desk. Patient states that she has been bleeding since she had her BTL done on 08/10/11. Pt states that she is having to change her pad very often and even reports that she has had to change it every hour at times. I spoke with Dr. Macon Large about this who suggested that pt try taking Provera 20 mg daily until she is seen in our office on 6/27. I also advised patient that if she is truly saturating a pad every hour she needs to be seen in [facility]. Pt agrees with plan. Also per Dr. Macon Large patient was informed that her BTL is not the cause of her bleeding. Pt voiced understanding and will give the provera a try.

## 2011-10-11 ENCOUNTER — Encounter (HOSPITAL_COMMUNITY): Payer: Self-pay | Admitting: *Deleted

## 2011-10-11 ENCOUNTER — Emergency Department (HOSPITAL_COMMUNITY)
Admission: EM | Admit: 2011-10-11 | Discharge: 2011-10-12 | Disposition: A | Discharge status: Home / Self Care | Payer: Medicaid Other | Attending: Emergency Medicine | Admitting: Emergency Medicine

## 2011-10-11 DIAGNOSIS — E876 Hypokalemia: Secondary | ICD-10-CM | POA: Insufficient documentation

## 2011-10-11 DIAGNOSIS — E0500 Thyrotoxicosis with diffuse goiter without thyrotoxic crisis or storm: Secondary | ICD-10-CM

## 2011-10-11 DIAGNOSIS — F17200 Nicotine dependence, unspecified, uncomplicated: Secondary | ICD-10-CM | POA: Insufficient documentation

## 2011-10-11 LAB — POCT I-STAT, CHEM 8
Calcium, Ion: 1.15 mmol/L (ref 1.12–1.32)
Chloride: 100 mEq/L (ref 96–112)
Glucose, Bld: 61 mg/dL — ABNORMAL LOW (ref 70–99)
HCT: 45 % (ref 36.0–46.0)
Hemoglobin: 15.3 g/dL — ABNORMAL HIGH (ref 12.0–15.0)
TCO2: 27 mmol/L (ref 0–100)

## 2011-10-11 MED ORDER — SODIUM CHLORIDE 0.9 % IV BOLUS (SEPSIS)
1000.0000 mL | Freq: Once | INTRAVENOUS | Status: AC
  Administered 2011-10-11: 1000 mL via INTRAVENOUS

## 2011-10-11 NOTE — ED Notes (Signed)
Patient with systemic muscle cramping.  Patient complaining of neck stiffness, low potassium which she has regularly.  Patient also has hypothyroid.  Patient has not followed up with PCP.  Patient [facility].

## 2011-10-11 NOTE — ED Notes (Signed)
To ED for eval of generalized body cramps. States she has had low potassium in the past. States she hasn't had her thyroid 'checked in a while'. Appears in nad

## 2011-10-11 NOTE — ED Provider Notes (Signed)
I saw and evaluated the patient, reviewed the resident's note and I agree with the findings and plan.  Pt has low potassium, history of same with cramping. Does not want pain medications. Will replete in the ED and discharge with supplementation for several days.   Amoreena Neubert B. Bernette Mayers, MD 10/11/11 2344

## 2011-10-11 NOTE — ED Provider Notes (Signed)
History     CSN: [id number]  Arrival date & time 10/11/11  1847   First MD Initiated Contact with Patient 10/11/11 2129      Chief Complaint  Patient presents with  . Muscle Pain    (Consider location/radiation/quality/duration/timing/severity/associated sxs/prior treatment) HPI  26 year old female past medical history of Graves' disease on Synthroid presents today with a six-month history of generalized body cramps and stiffness. She is on her thyroid checked for several months. She's not followed by anyone and she typically sees emergency doctors for her care. Her symptoms are simply getting worse over the past 2 weeks and that's what brings her in for treatment today. She does endorse taking her normal Synthroid 75 mcg per day. She denies any other symptoms including chest pain, shortness of breath, diaphoresis, leg swelling, nausea, vomiting, diarrhea, dysuria. Pulse illness moderate to severe depending on the moment. She is symptom-free when I saw her.  Past Medical History  Diagnosis Date  . Anemia     all 3 pregnancies  . Hyperemesis     3rd pregnancy  . Gestational hypertension 2010    with 3rd pregnancy  . Preterm labor     with 3rd pregnancy  . Asthma     albuterol inhaler, last used one year ago  . Thyroid disease     had radioactive iodine  . Grave's disease     Past Surgical History  Procedure Date  . Dilation and curettage of uterus 2010  . Laparoscopic tubal ligation 08/10/2011    Procedure: LAPAROSCOPIC TUBAL LIGATION;  Surgeon: Tereso Newcomer, MD;  Location: [facility];  Service: Gynecology;  Laterality: Bilateral;    Family History  Problem Relation Age of Onset  . Hypertension Father   . Hypertension Mother   . Anemia Mother   . Anesthesia problems Neg Hx   . Hypotension Neg Hx   . Malignant hyperthermia Neg Hx   . Pseudochol deficiency Neg Hx     History  Substance Use Topics  . Smoking status: Current Everyday Smoker -- 0.2 packs/day for .5  years    Types: Cigarettes  . Smokeless tobacco: Never Used  . Alcohol Use: No    OB History    Grav Para Term Preterm Abortions TAB SAB Ect Mult Living   5 4 3 1 1  1   4       Review of Systems Constitutional: Negative for fever and chills.  HENT: Negative for ear pain, sore throat and trouble swallowing.   Eyes: Negative for pain and visual disturbance.  Respiratory: Negative for cough and shortness of breath.   Cardiovascular: Negative for chest pain and leg swelling.  Gastrointestinal: Negative for nausea, vomiting, abdominal pain and diarrhea.  Genitourinary: Negative for dysuria, urgency and frequency.  Musculoskeletal: Negative for back pain and joint swelling.  Positive for allover body cramps and muscle stiffness which seem to come in paroxysms.  Skin: Negative for rash and wound.  Neurological: Negative for dizziness, syncope, speech difficulty, weakness and numbness.   Allergies  Shellfish allergy  Home Medications   Current Outpatient Rx  Name Route Sig Dispense Refill  . ALBUTEROL SULFATE HFA 108 (90 BASE) MCG/ACT IN AERS Inhalation Inhale 2 puffs into the lungs every 6 (six) hours as needed. Shortness of breath/asthma     . ASPIRIN-ACETAMINOPHEN-CAFFEINE 250-250-65 MG PO TABS Oral Take 2 tablets by mouth every 6 (six) hours as needed. For migraine.    Marland Kitchen HYDROCHLOROTHIAZIDE 25 MG PO TABS Oral Take 25  mg by mouth daily.    Marland Kitchen LEVOTHYROXINE SODIUM 75 MCG PO TABS Oral Take 1 tablet (75 mcg total) by mouth daily. 30 tablet 1  . MEDROXYPROGESTERONE ACETATE 10 MG PO TABS Oral Take 2 tablets (20 mg total) by mouth daily. 60 tablet 0  . FLINSTONES GUMMIES OMEGA-3 DHA PO Oral Take 2 tablets by mouth daily.      BP 128/89  Pulse 68  Temp 98.3 F (36.8 C) (Oral)  Resp 18  SpO2 99%  Breastfeeding? No  Physical Exam Consitutional: Pt in no acute distress.   Head: Normocephalic and atraumatic.  Eyes: Extraocular motion intact, no scleral icterus Neck: Supple without  meningismus, mass, or overt JVD Respiratory: Effort normal and breath sounds normal. No respiratory distress. CV: Heart regular rate and regular rhythm (sinus), no obvious murmurs.  Pulses +2 and symmetric Abdomen: Soft, non-tender, non-distended. No rebound or guarding.  MSK: Extremities are atraumatic without deformity, ROM intact Skin: Warm, dry, intact Neuro: Alert and oriented, no motor deficit noted.   Psychiatric: Mood and affect are normal    ED Course  Procedures (including critical care time)  Labs Reviewed  POCT I-STAT, CHEM 8 - Abnormal; Notable for the following:    Potassium 3.2 (*)     Creatinine, Ser 1.40 (*)     Glucose, Bld 61 (*)     Hemoglobin 15.3 (*)     All other components within normal limits  TSH   No results found.   1. Graves disease       MDM  Check TSH and electrolytes. Otherwise the patient on exam and is normal vital signs. Her neurological exam was also normal.  Muscle fatigue tests negative on right grip strength and eyelid movement.  Muscle release normal on right grip test.  Vital signs not consistent with low or high thyroid. History not consistent with either. Electrolytes appear normal on i-STAT.   Mild increase in creatinine. 1 L of fluid given. Patient told to hydrate more. Patient discharged home to followup with her primary care doctor.  PT DC home stable.  Discussed with pt the clinical impression, treatment in the ED, and follow up plan.  We alslo discussed the indications for returning to the ED, which include shortness or breath, confusion, fever, new weakness or numbness, chest pain, or any other concerning symptom.  The pt understood the treatment and plan, is stable, and is able to leave the ED.            Larrie Kass, MD 10/12/11 [phone]

## 2011-10-12 LAB — TSH: TSH: 126.466 u[IU]/mL — ABNORMAL HIGH (ref 0.350–4.500)

## 2011-10-12 NOTE — Discharge Instructions (Signed)
He did doctor to follow you for your symptoms. Some contacts are included below. See your doctor immediately--or return to the ED--with any new or troubling symptoms including fevers, weakness, new chest pain, shortness or breath, numbness, or any other concerning symptom.  RESOURCE GUIDE  Chronic Pain Problems: Contact [facility]  [phone] Patients need to be referred by their primary care doctor.  Insufficient Money for Medicine: Contact United Way:  call "211" or [facility] [phone].  No Primary Care Doctor: - Call [facility]  [phone] - can help you locate a primary care doctor that  accepts your insurance, provides certain services, etc. - Physician Referral Service- [phone]  Agencies that provide inexpensive medical care: - [facility]  [phone] - [facility]  [phone] - [facility]  [phone] - Women[facility] Clinic  [phone] - Planned Parenthood  [phone] [facility]  [phone]  Medicaid-accepting [facility] Providers: - [facility]- [address]  [phone], Mon-Fri 9am-7pm, Sat 9am-1pm - [facility]- [address] [place], Suite [place]  [phone] - [facility]- [address], Suite [place]  [phone] [facility]- [address]  [phone] - Renaye Rakers- [address] [place], [address], [phone]  Only accepts [place] patients after they have their name  applied to their card  Self Pay (no insurance) in [place]: - Sickle Cell Patients: Dr Willey Blade, [facility]  [address] [place], [phone] - [facility]- [address] [place]  [phone]       [facility] [place]- [address]       -     [facility]- see information above (Speak to [employer] if you do not have insurance)       -  [facility]- [address] [place], [phone]       -  [facility]- [address],  [phone]       -  [facility]- [address] [phone]       -  Dr Julio Sicks-  [address], [place], [phone]       -  [facility]- [address] [phone]       -  [facility]- [address], [phone], also [address] [phone]       -    [facility]- [address] [place], [phone], 1st & 3rd Saturday   every month, 10am-1pm  1) Find a Doctor and Pay Out of Pocket Although you won't have to find out who is covered by your insurance plan, it is a good idea to ask around and get recommendations. You will then need to call the office and see if the doctor you have chosen will accept you as a new patient and what types of options they offer for patients who are self-pay. Some doctors offer discounts or will set up payment plans for their patients who do not have insurance, but you will need to ask so you aren't surprised when you get to your appointment.  2) Contact Your Local Health Department Not all health departments have doctors that can see patients for sick visits, but many do, so it is worth a  call to see if yours does. If you don't know where your local health department is, you can check in your phone book. The CDC also has a tool to help you locate your state's health department, and many state websites also have listings of all of their local health departments.  3) Find a Walk-in Clinic If your illness is not likely to be very severe or complicated, you may want to try a walk in clinic. These are popping up all over the country in pharmacies, drugstores, and shopping centers. They're usually staffed by nurse practitioners or physician assistants that have been trained to treat common illnesses and complaints. They're usually fairly quick and inexpensive. However, if you have serious medical issues or chronic  medical problems, these are probably not your best option  STD Testing - [facility] Department of [facility] [place], STD Clinic, [address] [place], phone [phone] or [phone].  Monday - Friday, call for an appointment. [facility] Department of [employer], STD Clinic, [address], [place], phone [phone] or [phone].  Monday - Friday, call for an appointment.  Abuse/Neglect: [facility] Child Abuse Hotline ([phone] [facility] Child Abuse Hotline 972-220-2657 (After Hours)  Emergency Shelter:  [facility] 3400555914  Maternity Homes: - [facility] 201-409-0006 - [facility] 6781628484  MRSA Hotline #:   352-133-3119  [facility]  [facility] [place]  United Way [facility] Dept. [address]                 [address]         [address] [place] [address]  Blondell Reveal Phone:  [phone]                                  Phone:  [phone]                   Phone:  [phone]  [facility] Mental Health, [phone] - [phone]       -     [facility] in [place], [address],                                  [phone], [facility] Child Abuse Hotline 819-360-5541 or 639-126-8420 (After Hours)   Behavioral Health Services  Substance Abuse Resources: - Alcohol and Drug Services  ([phone] [phone] - [facility] [phone] - [place] [phone] Floydene Flock ([phone] - Residential & Outpatient Substance Abuse Program  [phone]  Psychological Services: [facility]  [phone] Services  [phone] - [facility], [phone] [place]. [address]  [place], ACCESS LINE: (612) 482-8483 or [phone], [employer]za  Dental Assistance  If unable  to pay or uninsured, contact:  [facility] or [facility]. to become qualified for the adult dental clinic.  Patients with Medicaid: [facility] [phone] [address], [phone] [address], [phone]  If unable to pay, or uninsured, contact [facility] [phone]) or [facility] Department [phone] in [place], [phone] in [facility]) to become qualified for the adult dental clinic  Other Low-Cost Community Dental Services: - [facility]- [address] [place], [place], [place], [address], [phone], Ext. 123, 2nd and 4th Thursday of the month at 6:30am.  10 clients each day by appointment, can sometimes see walk-in patients if someone does not show for an appointment. [facility]- [address] Ether Griffins [place], [place], [address], [phone] - [facility]- [address], [place], [place], [address], [phone] - [facility]- [phone] [facility] Health Department- [phone] [facility] [phone]         . Hypothyroidism .   The thyroid is a large gland located in the lower front of your neck. The thyroid gland helps control metabolism. Metabolism is how your body handles food. It controls metabolism with the hormone thyroxine. When this gland is underactive (hypothyroid), it produces too little hormone.  CAUSES These include:   Absence or destruction of thyroid tissue.   Goiter due to iodine deficiency.   Goiter due to medications.   Congenital defects (since birth).   Problems with the pituitary. This causes a lack of TSH (thyroid stimulating hormone). This hormone tells the thyroid to turn out more hormone.  SYMPTOMS  Lethargy (feeling as though you have no energy)   Cold intolerance   Weight gain (in  spite of normal food intake)   Dry skin   Coarse hair   Menstrual irregularity (if severe, may lead to infertility)   Slowing of thought processes  Cardiac problems are also caused by insufficient amounts of thyroid hormone. Hypothyroidism in the newborn is cretinism, and is an extreme form. It is important that this form be treated adequately and immediately or it will lead rapidly to retarded physical and mental development. DIAGNOSIS  To prove hypothyroidism, your caregiver may do blood tests and ultrasound tests. Sometimes the signs are hidden. It may be necessary for your caregiver to watch this illness with blood tests either before or after diagnosis and treatment. TREATMENT  Low levels of thyroid hormone are increased by using synthetic thyroid hormone. This is a safe, effective treatment. It usually takes about four weeks to gain the full effects of the medication. After you have the full effect of the medication, it will generally take another four weeks for problems to leave. Your caregiver may start you on low doses. If you have had heart problems the dose may be gradually increased. It is generally not an emergency to get rapidly to normal. HOME CARE INSTRUCTIONS   Take your medications as your caregiver suggests. Let your caregiver know of any medications you are taking or start taking. Your caregiver will help you with dosage schedules.   As your condition improves, your dosage needs may increase. It will be necessary to have continuing blood tests as suggested by your caregiver.   Report all suspected medication side effects to your caregiver.  SEEK MEDICAL CARE IF: Seek medical care if you develop:  Sweating.   Tremulousness (tremors).   Anxiety.   Rapid weight loss.   Heat intolerance.   Emotional swings.  Diarrhea.   Weakness.  SEEK IMMEDIATE MEDICAL CARE IF:  You develop chest pain, an irregular heart beat (palpitations), or a rapid heart beat. MAKE SURE  YOU:   Understand these instructions.   Will watch your condition.   Will get help right away if you are not doing well or get worse.  Document Released: 04/05/2005 Document Revised: 03/25/2011 Document Reviewed: 11/24/2007 [facility] Patient Information 2012 [place], [place].

## 2011-10-14 ENCOUNTER — Ambulatory Visit: Payer: Self-pay | Admitting: Obstetrics & Gynecology

## 2011-12-13 ENCOUNTER — Telehealth: Payer: Self-pay | Admitting: Medical

## 2011-12-13 NOTE — Telephone Encounter (Signed)
Patient called stating that she was referred to a family practice from our office and now has her medicaid set up. She needs to know the name of the family practice so that she can call to make an appointment and also have them added to her medicaid card.

## 2011-12-14 NOTE — Telephone Encounter (Signed)
Called pt and left message that I was calling to discuss her referral. I will try her again later today. She may call back and ask to speak with me. ** Pt needs to be informed that she may go to [facility] [phone] or any PCP that accepts Medicaid. She also needs follow up clinic appt for post op from her Essure procedure on 08/10/11.  Pt had previously reported bleeding issues post op and had appt on 10/14/11 but no-showed.

## 2011-12-16 NOTE — Telephone Encounter (Signed)
Called [name] and left message we are returning your call , please call back during office hours to discuss your questions.

## 2011-12-22 ENCOUNTER — Encounter: Payer: Self-pay | Admitting: *Deleted

## 2011-12-22 NOTE — Telephone Encounter (Signed)
Called [name] , states she needs the name of the [facility] we referred her to, states Medicaid had put a doctor on her card that is closed now. Informed her we had reccomended [facility] and gave her the phone number. Also discussed she has missed follow up for essure- states she had missed follow up because her doctor bills were piling up, but that she has medicaid now , so can be seen. Transferred to front desk for appointment for follow up for essure. Will send form to [facility] medicine for referral and form to patient to take to [facility] medicine.

## 2011-12-27 ENCOUNTER — Encounter: Payer: Self-pay | Admitting: *Deleted

## 2012-01-19 ENCOUNTER — Ambulatory Visit: Payer: Medicaid Other | Admitting: Obstetrics & Gynecology

## 2012-02-24 ENCOUNTER — Emergency Department (HOSPITAL_COMMUNITY): Payer: Medicaid Other

## 2012-02-24 ENCOUNTER — Ambulatory Visit (INDEPENDENT_AMBULATORY_CARE_PROVIDER_SITE_OTHER): Payer: Medicaid Other | Admitting: Obstetrics & Gynecology

## 2012-02-24 ENCOUNTER — Emergency Department (HOSPITAL_COMMUNITY)
Admission: EM | Admit: 2012-02-24 | Discharge: 2012-02-24 | Disposition: A | Discharge status: Home / Self Care | Payer: Medicaid Other | Attending: Emergency Medicine | Admitting: Emergency Medicine

## 2012-02-24 ENCOUNTER — Encounter: Payer: Self-pay | Admitting: Obstetrics & Gynecology

## 2012-02-24 VITALS — BP 163/113 | HR 60 | Temp 98.3°F | Ht 66.0 in | Wt 189.2 lb

## 2012-02-24 DIAGNOSIS — R0789 Other chest pain: Secondary | ICD-10-CM | POA: Insufficient documentation

## 2012-02-24 DIAGNOSIS — R079 Chest pain, unspecified: Secondary | ICD-10-CM

## 2012-02-24 DIAGNOSIS — Z79899 Other long term (current) drug therapy: Secondary | ICD-10-CM | POA: Insufficient documentation

## 2012-02-24 DIAGNOSIS — Z87891 Personal history of nicotine dependence: Secondary | ICD-10-CM | POA: Insufficient documentation

## 2012-02-24 DIAGNOSIS — Z3202 Encounter for pregnancy test, result negative: Secondary | ICD-10-CM | POA: Insufficient documentation

## 2012-02-24 DIAGNOSIS — E0500 Thyrotoxicosis with diffuse goiter without thyrotoxic crisis or storm: Secondary | ICD-10-CM | POA: Insufficient documentation

## 2012-02-24 DIAGNOSIS — J45909 Unspecified asthma, uncomplicated: Secondary | ICD-10-CM | POA: Insufficient documentation

## 2012-02-24 LAB — POCT I-STAT, CHEM 8
Calcium, Ion: 1.12 mmol/L (ref 1.12–1.23)
HCT: 37 % (ref 36.0–46.0)
TCO2: 25 mmol/L (ref 0–100)

## 2012-02-24 LAB — POCT I-STAT TROPONIN I: Troponin i, poc: 0.01 ng/mL (ref 0.00–0.08)

## 2012-02-24 MED ORDER — ALBUTEROL SULFATE HFA 108 (90 BASE) MCG/ACT IN AERS: 2.0000 | INHALATION_SPRAY | Freq: Four times a day (QID) | RESPIRATORY_TRACT | Status: DC | PRN

## 2012-02-24 NOTE — Progress Notes (Signed)
Per verbal md order pt put on oxygen 7lpm via mask. Also given Aspirin 325mg  at 1510 and SL Ntg 0.4mg  at 1515. Pt vitals at 1515 155/106, oxygen sat 100%. EMS arrived at 1517 and transported pt to [facility].

## 2012-02-24 NOTE — ED Provider Notes (Signed)
History     CSN: [id number]  Arrival date & time 02/24/12  1546   First MD Initiated Contact with Patient 02/24/12 1557      Chief Complaint  Patient presents with  . Chest Pain    HPI Pt has been having issues with intermittent pain in her chest.  She has been having trouble with intermittent muscles spasms as well.  She has been told her thyroid is overactive. These symptoms have been ongoing for months.  Pt was actually at [facility] hospital to follow up on a contraceptive procedure when she mentioned she still had been having chest pain associated with her other symptoms.  They took her blood pressure and sent her to the ED.  The pain is sharp and intermittent.  Nothing seems to bring it on.  She denies any swelling.  No trips or travel.  NO estrogen med Past Medical History  Diagnosis Date  . Anemia     all 3 pregnancies  . Hyperemesis     3rd pregnancy  . Gestational hypertension 2010    with 3rd pregnancy  . Preterm labor     with 3rd pregnancy  . Asthma     albuterol inhaler, last used one year ago  . Thyroid disease     had radioactive iodine  . Grave's disease     Past Surgical History  Procedure Date  . Dilation and curettage of uterus 2010  . Laparoscopic tubal ligation 08/10/2011    Procedure: LAPAROSCOPIC TUBAL LIGATION;  Surgeon: Tereso Newcomer, MD;  Location: [facility];  Service: Gynecology;  Laterality: Bilateral;    Family History  Problem Relation Age of Onset  . Hypertension Father   . Hypertension Mother   . Anemia Mother   . Anesthesia problems Neg Hx   . Hypotension Neg Hx   . Malignant hyperthermia Neg Hx   . Pseudochol deficiency Neg Hx     History  Substance Use Topics  . Smoking status: Former Smoker -- 0.2 packs/day for .5 years    Types: Cigarettes    Quit date: 11/18/2011  . Smokeless tobacco: Never Used  . Alcohol Use: No    OB History    Grav Para Term Preterm Abortions TAB SAB Ect Mult Living   5 4 3 1 1  1   4       Review  of Systems  All other systems reviewed and are negative.    Allergies  Shellfish allergy  Home Medications   Current Outpatient Rx  Name  Route  Sig  Dispense  Refill  . ALBUTEROL SULFATE HFA 108 (90 BASE) MCG/ACT IN AERS   Inhalation   Inhale 2 puffs into the lungs every 6 (six) hours as needed. Shortness of breath/asthma          . ASPIRIN-ACETAMINOPHEN-CAFFEINE 250-250-65 MG PO TABS   Oral   Take 2 tablets by mouth every 6 (six) hours as needed. For migraine.         Marland Kitchen HYDROCHLOROTHIAZIDE 25 MG PO TABS   Oral   Take 25 mg by mouth daily.         Marland Kitchen LEVOTHYROXINE SODIUM 75 MCG PO TABS   Oral   Take 1 tablet (75 mcg total) by mouth daily.   30 tablet   1   . MEDROXYPROGESTERONE ACETATE 10 MG PO TABS   Oral   Take 2 tablets (20 mg total) by mouth daily.   60 tablet   0   .  FLINSTONES GUMMIES OMEGA-3 DHA PO   Oral   Take 2 tablets by mouth daily.           BP 133/84  Pulse 61  Temp 98.9 F (37.2 C) (Oral)  Resp 20  SpO2 99%  LMP 09/09/2011  Physical Exam  Nursing note and vitals reviewed. Constitutional: She appears well-developed and well-nourished. No distress.  HENT:  Head: Normocephalic and atraumatic.  Right Ear: External ear normal.  Left Ear: External ear normal.  Eyes: Conjunctivae normal are normal. Right eye exhibits no discharge. Left eye exhibits no discharge. No scleral icterus.  Neck: Neck supple. No tracheal deviation present.  Cardiovascular: Normal rate, regular rhythm and intact distal pulses.   Pulmonary/Chest: Effort normal and breath sounds normal. No stridor. No respiratory distress. She has no wheezes. She has no rales.  Abdominal: Soft. Bowel sounds are normal. She exhibits no distension. There is no tenderness. There is no rebound and no guarding.  Musculoskeletal: She exhibits no edema and no tenderness.  Neurological: She is alert. She has normal strength. No sensory deficit. Cranial nerve deficit:  no gross defecits  noted. She exhibits normal muscle tone. She displays no seizure activity. Coordination normal.  Skin: Skin is warm and dry. No rash noted.  Psychiatric: She has a normal mood and affect.    ED Course  Procedures (including critical care time)  Rate: 68  Rhythm: normal sinus rhythm  QRS Axis: normal  Intervals: normal  ST/T Wave abnormalities: nonspecific t wave changes  Conduction Disutrbances:none  Narrative Interpretation: no sig changes  Old EKG Reviewed: inverted t waves noted on prior EKG  Labs Reviewed  POCT I-STAT, CHEM 8 - Abnormal; Notable for the following:    Potassium 3.2 (*)     Creatinine, Ser 1.20 (*)     Glucose, Bld 68 (*)     All other components within normal limits  POCT I-STAT TROPONIN I  POCT PREGNANCY, URINE   Dg Chest 2 View  02/24/2012  *RADIOLOGY REPORT*  Clinical Data: Left chest pain and shortness of breath.  CHEST - 2 VIEW  Comparison: 05/19/2011.  Findings: Mildly decreased inspiration with interval small amount of linear density at both lung bases.  Minimal bilateral pleural fluid.  Normal vascularity.  Minimal scoliosis.  IMPRESSION:  1.  Minimal bilateral pleural fluid. 2.  Poor inspiration with minimal bibasilar atelectasis.   Original Report Authenticated By: Beckie Salts, M.D.      1. Chest pain       MDM  Pt is low risk for PE by [facility]. PERC negative.  Chest pain atypical.  Doubt cardiac etiology.  At this time there does not appear to be any evidence of an acute emergency medical condition and the patient appears stable for discharge with appropriate outpatient follow up.        Celene Kras, MD 02/24/12 [phone]

## 2012-02-24 NOTE — ED Notes (Signed)
Per EMS- pt was sent from outpatient clinic at [facility] hospital for chest pain. Pt received 324mg  of Asprin and 1 nitro at women's. central Chest pressure that is worse with inspiration and lying flat. BP 142/102 HR 66 SPO2 98% on room air

## 2012-02-24 NOTE — ED Notes (Signed)
Pt to radiology, no distress noted.

## 2012-02-24 NOTE — Patient Instructions (Signed)
Return to clinic for any scheduled appointments or for any gynecologic concerns as needed.   

## 2012-02-24 NOTE — Progress Notes (Signed)
Patient describes crushing L sided chest pain, lots of pressure and SOB since yesterday, worsening over last few hours. BP 116/113.  She was put in supine position, O2 given via mask at 7L/min, Aspirin 325 mg po x 1, Nitroglycerin 0.4 mg SL given. EMS was called who came and transported patient to [facility] ED; report given to Consulting civil engineer at [facility].

## 2012-03-06 ENCOUNTER — Ambulatory Visit: Payer: Medicaid Other | Admitting: Family Medicine

## 2012-03-24 ENCOUNTER — Other Ambulatory Visit: Payer: Self-pay | Admitting: Advanced Practice Midwife

## 2012-03-29 ENCOUNTER — Encounter: Payer: Self-pay | Admitting: Obstetrics and Gynecology

## 2012-03-29 ENCOUNTER — Ambulatory Visit (INDEPENDENT_AMBULATORY_CARE_PROVIDER_SITE_OTHER): Payer: Medicaid Other | Admitting: Obstetrics and Gynecology

## 2012-03-29 ENCOUNTER — Telehealth: Payer: Self-pay | Admitting: *Deleted

## 2012-03-29 VITALS — BP 140/105 | HR 71 | Temp 98.0°F | Ht 66.0 in | Wt 192.2 lb

## 2012-03-29 DIAGNOSIS — Z23 Encounter for immunization: Secondary | ICD-10-CM

## 2012-03-29 DIAGNOSIS — I10 Essential (primary) hypertension: Secondary | ICD-10-CM

## 2012-03-29 DIAGNOSIS — O169 Unspecified maternal hypertension, unspecified trimester: Secondary | ICD-10-CM

## 2012-03-29 LAB — CBC
HCT: 39.3 % (ref 36.0–46.0)
MCH: 31.1 pg (ref 26.0–34.0)
MCV: 92 fL (ref 78.0–100.0)
Platelets: 362 10*3/uL (ref 150–400)
RBC: 4.27 MIL/uL (ref 3.87–5.11)
RDW: 14.6 % (ref 11.5–15.5)

## 2012-03-29 MED ORDER — INFLUENZA VIRUS VACC SPLIT PF IM SUSP: 0.5000 mL | Freq: Once | INTRAMUSCULAR | Status: DC

## 2012-03-29 MED ORDER — HYDROCHLOROTHIAZIDE 25 MG PO TABS: 25.0000 mg | ORAL_TABLET | Freq: Every day | ORAL | Status: DC

## 2012-03-29 NOTE — Telephone Encounter (Signed)
Need to preauth pelvic ultrasound before 1/218/13

## 2012-03-29 NOTE — Patient Instructions (Addendum)
Dysfunctional Uterine Bleeding  Normally, menstrual periods begin between ages 11 to 17 in young women. A normal menstrual cycle/period may begin every 23 days up to 35 days and lasts from 1 to 7 days. Around 12 to 14 days before your menstrual period starts, ovulation (ovary produces an egg) occurs. When counting the time between menstrual periods, count from the first day of bleeding of the previous period to the first day of bleeding of the next period.  Dysfunctional (abnormal) uterine bleeding is bleeding that is different from a normal menstrual period. Your periods may come earlier or later than usual. They may be lighter, have blood clots or be heavier. You may have bleeding between periods, or you may skip one period or more. You may have bleeding after sexual intercourse, bleeding after menopause, or no menstrual period.  CAUSES   · Pregnancy (normal, miscarriage, tubal).  · IUDs (intrauterine device, birth control).  · Birth control pills.  · Hormone treatment.  · Menopause.  · Infection of the cervix.  · Blood clotting problems.  · Infection of the inside lining of the uterus.  · Endometriosis, inside lining of the uterus growing in the pelvis and other female organs.  · Adhesions (scar tissue) inside the uterus.  · Obesity or severe weight loss.  · Uterine polyps inside the uterus.  · Cancer of the vagina, cervix, or uterus.  · Ovarian cysts or polycystic ovary syndrome.  · Medical problems (diabetes, thyroid disease).  · Uterine fibroids (noncancerous tumor).  · Problems with your female hormones.  · Endometrial hyperplasia, very thick lining and enlarged cells inside of the uterus.  · Medicines that interfere with ovulation.  · Radiation to the pelvis or abdomen.  · Chemotherapy.  DIAGNOSIS   · Your doctor will discuss the history of your menstrual periods, medicines you are taking, changes in your weight, stress in your life, and any medical problems you may have.  · Your doctor will do a physical  and pelvic examination.  · Your doctor may want to perform certain tests to make a diagnosis, such as:  · Pap test.  · Blood tests.  · Cultures for infection.  · CT scan.  · Ultrasound.  · Hysteroscopy.  · Laparoscopy.  · MRI.  · Hysterosalpingography.  · D and C.  · Endometrial biopsy.  TREATMENT   Treatment will depend on the cause of the dysfunctional uterine bleeding (DUB). Treatment may include:  · Observing your menstrual periods for a couple of months.  · Prescribing medicines for medical problems, including:  · Antibiotics.  · Hormones.  · Birth control pills.  · Removing an IUD (intrauterine device, birth control).  · Surgery:  · D and C (scrape and remove tissue from inside the uterus).  · Laparoscopy (examine inside the abdomen with a lighted tube).  · Uterine ablation (destroy lining of the uterus with electrical current, laser, heat, or freezing).  · Hysteroscopy (examine cervix and uterus with a lighted tube).  · Hysterectomy (remove the uterus).  HOME CARE INSTRUCTIONS   · If medicines were prescribed, take exactly as directed. Do not change or switch medicines without consulting your caregiver.  · Long term heavy bleeding may result in iron deficiency. Your caregiver may have prescribed iron pills. They help replace the iron that your body lost from heavy bleeding. Take exactly as directed.  · Do not take aspirin or medicines that contain aspirin one week before or during your menstrual period. Aspirin may make   the bleeding worse.  · If you need to change your sanitary pad or tampon more than once every 2 hours, stay in bed with your feet elevated and a cold pack on your lower abdomen. Rest as much as possible, until the bleeding stops or slows down.  · Eat well-balanced meals. Eat foods high in iron. Examples are:  · Leafy green vegetables.  · Whole-grain breads and cereals.  · Eggs.  · Meat.  · Liver.  · Do not try to lose weight until the abnormal bleeding has stopped and your blood iron level is  back to normal. Do not lift more than ten pounds or do strenuous activities when you are bleeding.  · For a couple of months, make note on your calendar, marking the start and ending of your period, and the type of bleeding (light, medium, heavy, spotting, clots or missed periods). This is for your caregiver to better evaluate your problem.  SEEK MEDICAL CARE IF:   · You develop nausea (feeling sick to your stomach) and vomiting, dizziness, or diarrhea while you are taking your medicine.  · You are getting lightheaded or weak.  · You have any problems that may be related to the medicine you are taking.  · You develop pain with your DUB.  · You want to remove your IUD.  · You want to stop or change your birth control pills or hormones.  · You have any type of abnormal bleeding mentioned above.  · You are over 16 years old and have not had a menstrual period yet.  · You are 26 years old and you are still having menstrual periods.  · You have any of the symptoms mentioned above.  · You develop a rash.  SEEK IMMEDIATE MEDICAL CARE IF:   · An oral temperature above 102° F (38.9° C) develops.  · You develop chills.  · You are changing your sanitary pad or tampon more than once an hour.  · You develop abdominal pain.  · You pass out or faint.  Document Released: 04/02/2000 Document Revised: 06/28/2011 Document Reviewed: 03/04/2009  Exit[employer]® Patient Information ©2013 [employer].

## 2012-03-29 NOTE — Progress Notes (Signed)
Subjective:     Patient ID: [name], female   DOB: [birth year], 26 y.o.   MRN: [id number]  HPI [name] is a 26 y.o. [id number] presenting with c/o vaginal bleeding for the past month. She received the ESSURE in April 2013. States she bleed a couple of days after the procedure then stopped having monthly cycles. She had an appointment on 11/7 for routine f/u for the essure, but was having chest pain and elevated BP and was sent to [facility] ED. Shortly after that episode, the vaginal bleeding started. She is bleeding through 3-4 pads/day and having menstrual cramping daily. Prior to the procedure she had very irregular periods, bleeding very heavy for 2 days to 3 weeks at a time.  Denies weakness, dizziness, HA, chest pain, palpitations, abdominal or pelvic pain.  Review of Systems  Constitutional: Negative for fever, chills, appetite change and fatigue.  Respiratory: Negative for chest tightness and shortness of breath.   Cardiovascular: Negative for chest pain, palpitations and leg swelling.  Gastrointestinal: Negative for nausea, vomiting, abdominal pain, diarrhea, constipation and abdominal distention.  Genitourinary: Positive for vaginal bleeding and menstrual problem. Negative for dysuria, frequency, hematuria, flank pain, vaginal discharge, difficulty urinating, vaginal pain and pelvic pain.  Skin: Negative for color change and pallor.  Neurological: Negative for dizziness, light-headedness and numbness.  Hematological: Does not bruise/bleed easily.       Objective:  Physical Exam  Constitutional: She is oriented to person, place, and time. She appears well-developed and well-nourished. No distress.  Neck: Neck supple. No thyromegaly present.  Cardiovascular: Normal rate, regular rhythm and normal heart sounds.   Pulmonary/Chest: Effort normal and breath sounds normal.  Abdominal: Soft. Bowel sounds are normal. There is no tenderness. There is no rebound and no  guarding.  Neurological: She is alert and oriented to person, place, and time.  Skin: Skin is warm and dry. She is not diaphoretic.  Psychiatric: She has a normal mood and affect. Her behavior is normal. Judgment and thought content normal.    BP 140/105  Pulse 71  Temp 98 F (36.7 C) (Oral)  Ht 5\' 6"  (1.676 m)  Wt 192 lb 3.2 oz (87.181 kg)  BMI 31.02 kg/m2  LMP 02/27/2012  Breastfeeding? No    Assessment:     26 y.o. [id number] with irregular menstrual bleeding s/p ESSURE Hypertensive    Plan:     1. Labs: CBC and urine G/C 2. Vaginal U/S scheduled to assess source of bleeding 3. Provided with list of PCPs who accept Medicaid to f/u on HTN and hypothyroidism 4. F/U scheduled for 3 weeks to discuss results

## 2012-03-30 NOTE — Telephone Encounter (Signed)
Preauth done [id number]

## 2012-04-05 ENCOUNTER — Ambulatory Visit (HOSPITAL_COMMUNITY)
Admission: RE | Admit: 2012-04-05 | Discharge: 2012-04-05 | Disposition: A | Discharge status: Home / Self Care | Payer: Medicaid Other | Source: Ambulatory Visit | Attending: Obstetrics and Gynecology | Admitting: Obstetrics and Gynecology

## 2012-04-05 DIAGNOSIS — N949 Unspecified condition associated with female genital organs and menstrual cycle: Secondary | ICD-10-CM | POA: Insufficient documentation

## 2012-04-05 DIAGNOSIS — N938 Other specified abnormal uterine and vaginal bleeding: Secondary | ICD-10-CM | POA: Insufficient documentation

## 2012-04-05 DIAGNOSIS — Z23 Encounter for immunization: Secondary | ICD-10-CM

## 2012-04-21 ENCOUNTER — Ambulatory Visit: Payer: Medicaid Other | Admitting: Obstetrics and Gynecology

## 2012-06-09 IMAGING — NM NM RAI THERAPY FOR HYPERTHYROIDISM
1 series · 1 of 1 positions shown · non-contrast
Comparison: none

CLINICAL DATA: Graves disease

NUCLEAR MEDICINE RADIOACTIVE IODINE THERAPY FOR HYPERTHYROIDISM:
TECHNIQUE: I personally discussed the risks and benefits of 2-1B1 therapy for
hyperthyroidism with the patient.
Alternate treatments were also discussed, as well as likelihood of
success, and I answered the patient's questions.
Radiation safety principals and post-therapy guidelines for
protecting the population were discussed with the patient.
Written informed consent for radioactive iodine therapy for
hyperthyroidism was obtained .
Time out protocol followed.
Following ingestion of the radiopharmaceutical, patient was given a
page of standard post-therapy instructions.
Patient instructed to call the nuclear medicine department if any
questions arise.
No immediate complications.
Radiopharmaceutical:  10 mCi 2-1B1 sodium iodide orally
Correlative exams:  Thyroid uptake and scan 03/04/2011 showing a
normal thyroid scan and an elevated 24 uptake of 69%

[Series 1: st static · 2.33mm/px · 1 of 1 slices shown]
[im 1/1]
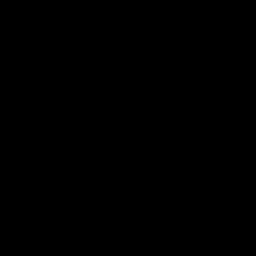

[1 of 1 positions shown; findings below may reference images not displayed]

IMPRESSION: Radioactive iodine therapy for [place] utilizing 10 mCi of 2-1B1
sodium iodide orally.

## 2012-07-06 ENCOUNTER — Encounter: Payer: Self-pay | Admitting: Family Medicine

## 2012-07-06 ENCOUNTER — Ambulatory Visit (INDEPENDENT_AMBULATORY_CARE_PROVIDER_SITE_OTHER): Payer: Medicaid Other | Admitting: Family Medicine

## 2012-07-06 VITALS — BP 152/100 | HR 88 | Ht 66.0 in | Wt 204.0 lb

## 2012-07-06 DIAGNOSIS — E0500 Thyrotoxicosis with diffuse goiter without thyrotoxic crisis or storm: Secondary | ICD-10-CM

## 2012-07-06 DIAGNOSIS — I10 Essential (primary) hypertension: Secondary | ICD-10-CM | POA: Insufficient documentation

## 2012-07-06 LAB — BASIC METABOLIC PANEL
CO2: 29 mEq/L (ref 19–32)
Calcium: 8.5 mg/dL (ref 8.4–10.5)
Chloride: 103 mEq/L (ref 96–112)
Glucose, Bld: 64 mg/dL — ABNORMAL LOW (ref 70–99)
Sodium: 139 mEq/L (ref 135–145)

## 2012-07-06 MED ORDER — LISINOPRIL 20 MG PO TABS: 20.0000 mg | ORAL_TABLET | Freq: Every day | ORAL | Status: DC

## 2012-07-06 NOTE — Assessment & Plan Note (Addendum)
Will add Lisinopril to HCTZ for improved control.  Will check BMET.  Also, will have her meet with Health Coach

## 2012-07-06 NOTE — Progress Notes (Signed)
  Subjective:    Patient ID: [name], female    DOB: Apr 01, 1986, 27 y.o.   MRN: [id number]  HPI:  [name] comes in to establish care.  She has several complaints.   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  She is taking levothyroxine as prescribed. She complains of diffuse aches, fatigue, diarrhea, weight gain.  She was being seen at [facility] clinic for this.  She also developed HTN, which she relates to her thyroid problem.  She says one time her blood pressure was so high the [facility] clinic sent her to the ER.  She is taking HCTZ 25 mg PO daily.  Denies chest pain, dyspnea, but does complain of some LE edema on and off.   Past Medical History  Diagnosis Date  . Anemia     all 3 pregnancies  . Hyperemesis     3rd pregnancy  . Gestational hypertension 2010    with 3rd pregnancy  . Preterm labor     with 3rd pregnancy  . Asthma     albuterol inhaler, last used one year ago  . Thyroid disease     had radioactive iodine  . Grave's disease     History  Substance Use Topics  . Smoking status: Former Smoker -- 0.25 packs/day for .5 years    Types: Cigarettes    Quit date: 11/18/2011  . Smokeless tobacco: Never Used  . Alcohol Use: No    Family History  Problem Relation Age of Onset  . Hypertension Father   . Hypertension Mother   . Anemia Mother   . Anesthesia problems Neg Hx   . Hypotension Neg Hx   . Malignant hyperthermia Neg Hx   . Pseudochol deficiency Neg Hx   . Thyroid disease Maternal Aunt   . Diabetes Maternal Uncle   . Diabetes Maternal Grandmother   . Diabetes Paternal Grandmother   . Cancer Paternal Grandmother    ROS Pertinent items in HPI    Objective:  Physical Exam:  BP 152/100  Pulse 88  Ht 5\' 6"  (1.676 m)  Wt 204 lb (92.534 kg)  BMI 32.94 kg/m2 General appearance: alert, cooperative and no distress Head: Normocephalic, without obvious abnormality, atraumatic Lungs: clear to auscultation  bilaterally Heart: regular rate and rhythm, S1, S2 normal, no murmur, click, rub or gallop Pulses: 2+ and symmetric       Assessment & Plan:

## 2012-07-06 NOTE — Assessment & Plan Note (Signed)
Will check TSH to see if she is on correct dose of synthroid.  She has multiple complaints that could be affected by this.  I have asked her to follow up in 1 week to discuss her mood, diarrhea, and diffuse pain.

## 2012-07-06 NOTE — Patient Instructions (Signed)
It was nice to meet you.  Your blood pressure today was BP: 152/100 mmHg.  Remember your goal blood pressure is about 130/80.  Please be sure to take your medication every day.  Continue to take the HCTZ, I also want you to start taking Lisinopril, one pill daily.  The Rx is sent to your pharmacy.   I will send you a letter with your lab results, or call you if anything is abnormal.    Please make an appointment to see me in about 1-2 weeks so we can talk about your other concerns in more depth.

## 2012-07-07 ENCOUNTER — Telehealth: Payer: Self-pay | Admitting: Family Medicine

## 2012-07-07 NOTE — Telephone Encounter (Signed)
Spoke with patient and she has been reading package insert with  HCTZ and she is concerned that the two meds may interact. Reassured patient that these meds are frequently given together and actually there is a combination tab with both ingredients.  Ask her to read me what insert states and what she reads  is that  patient should be monitored closely when on these medication. Explained that MD will be following labs periodically.  Patient voices understanding.

## 2012-07-07 NOTE — Telephone Encounter (Signed)
Patient is calling because she thinks there is a Drug Interaction with Lisinopril and HCTZ so she needs to know what she needs to do.

## 2012-07-10 ENCOUNTER — Telehealth: Payer: Self-pay | Admitting: Family Medicine

## 2012-07-10 NOTE — Telephone Encounter (Signed)
Attempted to call patient on both phone #'s, no answer, no voice mail could be left.   I was calling to notify her her TSH is high, meaning we need to increase her levothyroxine dose.  She is scheduled to see me in clinic on 3/27, I will let her know then.

## 2012-07-13 ENCOUNTER — Ambulatory Visit (INDEPENDENT_AMBULATORY_CARE_PROVIDER_SITE_OTHER): Payer: Medicaid Other | Admitting: Family Medicine

## 2012-07-13 ENCOUNTER — Encounter: Payer: Self-pay | Admitting: Family Medicine

## 2012-07-13 VITALS — BP 125/86 | HR 56 | Temp 98.4°F | Ht 66.0 in | Wt 203.2 lb

## 2012-07-13 DIAGNOSIS — F0630 Mood disorder due to known physiological condition, unspecified: Secondary | ICD-10-CM | POA: Insufficient documentation

## 2012-07-13 DIAGNOSIS — I10 Essential (primary) hypertension: Secondary | ICD-10-CM

## 2012-07-13 DIAGNOSIS — E0500 Thyrotoxicosis with diffuse goiter without thyrotoxic crisis or storm: Secondary | ICD-10-CM

## 2012-07-13 MED ORDER — ALBUTEROL SULFATE HFA 108 (90 BASE) MCG/ACT IN AERS: 2.0000 | INHALATION_SPRAY | Freq: Four times a day (QID) | RESPIRATORY_TRACT | Status: DC | PRN

## 2012-07-13 MED ORDER — LEVOTHYROXINE SODIUM 150 MCG PO TABS: 150.0000 ug | ORAL_TABLET | Freq: Every day | ORAL | Status: DC

## 2012-07-13 MED ORDER — QUETIAPINE FUMARATE 100 MG PO TABS: 100.0000 mg | ORAL_TABLET | Freq: Every day | ORAL | Status: DC

## 2012-07-13 NOTE — Progress Notes (Signed)
  Subjective:    Patient ID: [name], female    DOB: 1985-08-14, 27 y.o.   MRN: [id number]  HPI:  Rudene comes in for follow up.  She is still having a difficult time emotionally.  She says her boyfriend is the only adult she talks to on a regular basis, and he does not understand staying at home with the 4 children alone.  She does not have much family support.  She says she has periods of sadness, wants to cry, but also describes anger.  She also has pain that makes it difficult to get up out of bed int he morning.  Her daughter has missed school because of this.   She admits that she was not taking her blood pressure or thyroid medications regularly until her visit last week.  She says she has not felt that much better when she does take them so she doesn't always see the point.  However, she is really motivated to do a good job taking care of her self.    Past Medical History  Diagnosis Date  . Anemia     all 3 pregnancies  . Hyperemesis     3rd pregnancy  . Gestational hypertension 2010    with 3rd pregnancy  . Preterm labor     with 3rd pregnancy  . Asthma     albuterol inhaler, last used one year ago  . Thyroid disease     had radioactive iodine  . Grave's disease     History  Substance Use Topics  . Smoking status: Former Smoker -- 0.25 packs/day for .5 years    Types: Cigarettes    Quit date: 11/18/2011  . Smokeless tobacco: Never Used  . Alcohol Use: No  - Patient does admit to Marijuana use.   Family History  Problem Relation Age of Onset  . Hypertension Father   . Hypertension Mother   . Anemia Mother   . Bipolar disorder Mother   . Anesthesia problems Neg Hx   . Hypotension Neg Hx   . Malignant hyperthermia Neg Hx   . Pseudochol deficiency Neg Hx   . Thyroid disease Maternal Aunt   . Bipolar disorder Maternal Aunt   . Diabetes Maternal Uncle   . Diabetes Maternal Grandmother   . Diabetes Paternal Grandmother   . Cancer Paternal Grandmother     ROS Pertinent items in HPI    Objective:  Physical Exam:  BP 125/86  Pulse 56  Temp(Src) 98.4 F (36.9 C) (Oral)  Ht 5\' 6"  (1.676 m)  Wt 203 lb 3.2 oz (92.171 kg)  BMI 32.81 kg/m2  LMP 06/08/2012  Breastfeeding? No General appearance: alert, cooperative and no distress Psych: thought content and judgement seem in tact. Affect is sad, does not appear to be responding to internal stimuli.  No SI/HI PHQ9: 21 MDQ16: 12      Assessment & Plan:

## 2012-07-13 NOTE — Progress Notes (Signed)
Patient Identified Concern:  Low energy, not feeling well Stage of Change Patient Is In:  Preparations, has been making changes for less than 6 months. Patient Reported Barriers:  Forgets to take medications, low energy, sad Patient Reported Perceived Benefits:  Feeling better, taking care of her children, being able to work Patient Reports Self-Efficacy:   Pt displays some self-efficacy based on past success this week with taking bp medications daily. Behavior Change Supports:  Boyfriend is supportive, health coach weekly calls Goals:  Pt's goal to take all medications daily for next week. Patient Education:  We talked about the importance of medications adherence and how it may help her feel better.

## 2012-07-13 NOTE — Assessment & Plan Note (Signed)
Improved control today- continue HCTZ and lisinopril.

## 2012-07-13 NOTE — Patient Instructions (Signed)
It was good to see you today. Your blood pressure today was BP: 125/86 mmHg.  Remember your goal blood pressure is about 130/80.  Please be sure to take your medication every day.    For your thyroid I am doubling your dose of levothyroxine, you can take two of the ones you have and then the new Rx at the pharmacy will be the larger dose.   For your Mood, I am going to start you on a low dose of Seroquel - remember it can make you drowsy for the first week or so, but that usually gets better.    Please call Dr. Pascal Lux to discuss the possibility of doing talk therapy with her.

## 2012-07-13 NOTE — Assessment & Plan Note (Signed)
Concern for Depression +/- Bipolar.  Given significant score on MDQ 16, and family history, will avoid SSRI's in this patient.  Will start on low dose Seroquel 100 PO qhs.  Discussed importance of compliance, length of time it would take to start working, and drowsiness as a side effect at length.  She is working with our Control and instrumentation engineer who can offer support, I will also ask her to speak to Dr. Pascal Lux, our psychologist about possible CBT.  F/U with me in 2 weeks.

## 2012-07-13 NOTE — Assessment & Plan Note (Signed)
Will increase levothyroxine to 150 mcg po daily, although she was not taking it regularly when TSH was checked.  Again stressed compliance and how thryoid issues can contribute to mood issues.

## 2012-07-26 ENCOUNTER — Telehealth: Payer: Self-pay | Admitting: Home Health Services

## 2012-07-26 NOTE — Telephone Encounter (Signed)
Spoke with [name].  Pt reports taking all medications daily as prescribed.  Pt reports feeling better, does not have dizzy spells.  Pt says her energy is up a little.  Reports getting upset and "laying her hands" on her boy friend this past week.   Pt set goal to continue taking all medications daily and to follow up with PCP on 08/04/12.  Pt has not called DR. Pascal Lux at this point. Informed her that she has to call her directly to set up appointment.   Pt's overall goal is HTN management, and to get to counseling.

## 2012-07-27 ENCOUNTER — Ambulatory Visit: Payer: Medicaid Other | Admitting: Family Medicine

## 2012-07-31 ENCOUNTER — Ambulatory Visit (INDEPENDENT_AMBULATORY_CARE_PROVIDER_SITE_OTHER): Payer: Medicaid Other | Admitting: Family Medicine

## 2012-07-31 ENCOUNTER — Encounter: Payer: Self-pay | Admitting: Family Medicine

## 2012-07-31 VITALS — BP 125/80 | HR 72 | Temp 98.3°F | Ht 66.0 in | Wt 198.0 lb

## 2012-07-31 DIAGNOSIS — F0630 Mood disorder due to known physiological condition, unspecified: Secondary | ICD-10-CM

## 2012-07-31 DIAGNOSIS — I10 Essential (primary) hypertension: Secondary | ICD-10-CM

## 2012-07-31 DIAGNOSIS — E0500 Thyrotoxicosis with diffuse goiter without thyrotoxic crisis or storm: Secondary | ICD-10-CM

## 2012-07-31 MED ORDER — HYDROCHLOROTHIAZIDE 25 MG PO TABS: 25.0000 mg | ORAL_TABLET | Freq: Every day | ORAL | Status: DC

## 2012-07-31 MED ORDER — QUETIAPINE FUMARATE 50 MG PO TABS: 50.0000 mg | ORAL_TABLET | Freq: Every day | ORAL | Status: DC

## 2012-07-31 NOTE — Assessment & Plan Note (Signed)
Continue levothyroxine, check TSH in 2 weeks.

## 2012-07-31 NOTE — Assessment & Plan Note (Signed)
Seroquel 100 is too sedating- will decrease to 50 mg PO qhs, encouraged to call Dr. Pascal Lux.  F/U with me in 2 weeks.

## 2012-07-31 NOTE — Assessment & Plan Note (Signed)
Improved control and compliance, encouraged to continue.

## 2012-07-31 NOTE — Progress Notes (Signed)
  Subjective:    Patient ID: [name], female    DOB: 1986-02-01, 27 y.o.   MRN: [id number]  HPI:  [name] comes in for follow up.   Depression: Taking Seroquel 100 PO qhs, it is making her very sleepy, which is interfering with work.  Denies any mood worsening, in fact states it may be slightly better.   HTN: taking lisinopril and HCTZ without difficulty, says she is complaint, no chest pain, palpitations, LE edema.   Thyroid: taking levothyroxine.   Loose stools: says for several week (since before starting Seroquel) has been having crampy abdominal pain and diarrhea. No blood or mucous.  No fevers, nausea, vomiting.   Past Medical History  Diagnosis Date  . Anemia     all 3 pregnancies  . Hyperemesis     3rd pregnancy  . Gestational hypertension 2010    with 3rd pregnancy  . Preterm labor     with 3rd pregnancy  . Asthma     albuterol inhaler, last used one year ago  . Thyroid disease     had radioactive iodine  . Grave's disease     History  Substance Use Topics  . Smoking status: Former Smoker -- 0.25 packs/day for .5 years    Types: Cigarettes    Quit date: 11/18/2011  . Smokeless tobacco: Never Used  . Alcohol Use: No    Family History  Problem Relation Age of Onset  . Hypertension Father   . Hypertension Mother   . Anemia Mother   . Bipolar disorder Mother   . Anesthesia problems Neg Hx   . Hypotension Neg Hx   . Malignant hyperthermia Neg Hx   . Pseudochol deficiency Neg Hx   . Thyroid disease Maternal Aunt   . Bipolar disorder Maternal Aunt   . Diabetes Maternal Uncle   . Diabetes Maternal Grandmother   . Diabetes Paternal Grandmother   . Cancer Paternal Grandmother      ROS Pertinent items in HPI    Objective:  Physical Exam:  BP 125/80  Pulse 72  Temp(Src) 98.3 F (36.8 C) (Oral)  Ht 5\' 6"  (1.676 m)  Wt 198 lb (89.812 kg)  BMI 31.97 kg/m2  LMP 07/22/2012 General appearance: alert, cooperative and no distress Head:  Normocephalic, without obvious abnormality, atraumatic Lungs: clear to auscultation bilaterally Heart: regular rate and rhythm, S1, S2 normal, no murmur, click, rub or gallop Abd: +bs, NT/ND.  Pulses: 2+ and symmetric       Assessment & Plan:

## 2012-07-31 NOTE — Patient Instructions (Signed)
It was good to see you.  I want you to decrease your Seroquel to 50 mg at bedtime since you are too sleepy.   Please continue your thyroid medications and blood pressure medications as prescribed.   Please see below about your abdominal pain and diarrhea.   Irritable Bowel Syndrome Irritable Bowel Syndrome (IBS) is caused by a disturbance of normal bowel function. Other terms used are spastic colon, mucous colitis, and irritable colon. It does not require surgery, nor does it lead to cancer. There is no cure for IBS. But with proper diet, stress reduction, and medication, you will find that your problems (symptoms) will gradually disappear or improve. IBS is a common digestive disorder. It usually appears in late adolescence or early adulthood. Women develop it twice as often as men. CAUSES  After food has been digested and absorbed in the small intestine, waste material is moved into the colon (large intestine). In the colon, water and salts are absorbed from the undigested products coming from the small intestine. The remaining residue, or fecal material, is held for elimination. Under normal circumstances, gentle, rhythmic contractions on the bowel walls push the fecal material along the colon towards the rectum. In IBS, however, these contractions are irregular and poorly coordinated. The fecal material is either retained too long, resulting in constipation, or expelled too soon, producing diarrhea. SYMPTOMS  The most common symptom of IBS is pain. It is typically in the lower left side of the belly (abdomen). But it may occur anywhere in the abdomen. It can be felt as heartburn, backache, or even as a dull pain in the arms or shoulders. The pain comes from excessive bowel-muscle spasms and from the buildup of gas and fecal material in the colon. This pain:  Can range from sharp belly (abdominal) cramps to a dull, continuous ache.  Usually worsens soon after eating.  Is typically relieved by  having a bowel movement or passing gas. Abdominal pain is usually accompanied by constipation. But it may also produce diarrhea. The diarrhea typically occurs right after a meal or upon arising in the morning. The stools are typically soft and watery. They are often flecked with secretions (mucus). Other symptoms of IBS include:  Bloating.  Loss of appetite.  Heartburn.  Feeling sick to your stomach (nausea).  Belching  Vomiting  Gas. IBS may also cause a number of symptoms that are unrelated to the digestive system:  Fatigue.  Headaches.  Anxiety  Shortness of breath  Difficulty in concentrating.  Dizziness. These symptoms tend to come and go. DIAGNOSIS  The symptoms of IBS closely mimic the symptoms of other, more serious digestive disorders. So your caregiver may wish to perform a variety of additional tests to exclude these disorders. He/she wants to be certain of learning what is wrong (diagnosis). The nature and purpose of each test will be explained to you. TREATMENT A number of medications are available to help correct bowel function and/or relieve bowel spasms and abdominal pain. Among the drugs available are:  Mild, non-irritating laxatives for severe constipation and to help restore normal bowel habits.  Specific anti-diarrheal medications to treat severe or prolonged diarrhea.  Anti-spasmodic agents to relieve intestinal cramps.  Your caregiver may also decide to treat you with a mild tranquilizer or sedative during unusually stressful periods in your life. The important thing to remember is that if any drug is prescribed for you, make sure that you take it exactly as directed. Make sure that your caregiver knows  how well it worked for you. HOME CARE INSTRUCTIONS   Avoid foods that are high in fat or oils. Some examples ZOX:WRUEA cream, butter, frankfurters, sausage, and other fatty meats.  Avoid foods that have a laxative effect, such as fruit, fruit  juice, and dairy products.  Cut out carbonated drinks, chewing gum, and "gassy" foods, such as beans and cabbage. This may help relieve bloating and belching.  Bran taken with plenty of liquids may help relieve constipation.  Keep track of what foods seem to trigger your symptoms.  Avoid emotionally charged situations or circumstances that produce anxiety.  Start or continue exercising.  Get plenty of rest and sleep. MAKE SURE YOU:   Understand these instructions.  Will watch your condition.  Will get help right away if you are not doing well or get worse. Document Released: 04/05/2005 Document Revised: 06/28/2011 Document Reviewed: 11/24/2007 [facility] Patient Information 2013 [place], [place].

## 2012-08-14 ENCOUNTER — Ambulatory Visit: Payer: Medicaid Other | Admitting: Family Medicine

## 2012-11-23 ENCOUNTER — Emergency Department (HOSPITAL_COMMUNITY)
Admission: EM | Admit: 2012-11-23 | Discharge: 2012-11-23 | Disposition: A | Discharge status: Home / Self Care | Payer: Medicaid Other | Attending: Emergency Medicine | Admitting: Emergency Medicine

## 2012-11-23 ENCOUNTER — Emergency Department (HOSPITAL_COMMUNITY): Payer: Medicaid Other

## 2012-11-23 ENCOUNTER — Encounter (HOSPITAL_COMMUNITY): Payer: Self-pay | Admitting: Emergency Medicine

## 2012-11-23 DIAGNOSIS — R9431 Abnormal electrocardiogram [ECG] [EKG]: Secondary | ICD-10-CM

## 2012-11-23 DIAGNOSIS — Z87891 Personal history of nicotine dependence: Secondary | ICD-10-CM | POA: Insufficient documentation

## 2012-11-23 DIAGNOSIS — Z8639 Personal history of other endocrine, nutritional and metabolic disease: Secondary | ICD-10-CM | POA: Insufficient documentation

## 2012-11-23 DIAGNOSIS — R071 Chest pain on breathing: Secondary | ICD-10-CM | POA: Insufficient documentation

## 2012-11-23 DIAGNOSIS — J45909 Unspecified asthma, uncomplicated: Secondary | ICD-10-CM | POA: Insufficient documentation

## 2012-11-23 DIAGNOSIS — R0789 Other chest pain: Secondary | ICD-10-CM

## 2012-11-23 DIAGNOSIS — M2550 Pain in unspecified joint: Secondary | ICD-10-CM | POA: Insufficient documentation

## 2012-11-23 DIAGNOSIS — G8929 Other chronic pain: Secondary | ICD-10-CM | POA: Insufficient documentation

## 2012-11-23 DIAGNOSIS — Z862 Personal history of diseases of the blood and blood-forming organs and certain disorders involving the immune mechanism: Secondary | ICD-10-CM | POA: Insufficient documentation

## 2012-11-23 DIAGNOSIS — R0602 Shortness of breath: Secondary | ICD-10-CM | POA: Insufficient documentation

## 2012-11-23 DIAGNOSIS — R9389 Abnormal findings on diagnostic imaging of other specified body structures: Secondary | ICD-10-CM | POA: Insufficient documentation

## 2012-11-23 DIAGNOSIS — E079 Disorder of thyroid, unspecified: Secondary | ICD-10-CM | POA: Insufficient documentation

## 2012-11-23 MED ORDER — KETOROLAC TROMETHAMINE 60 MG/2ML IM SOLN
60.0000 mg | Freq: Once | INTRAMUSCULAR | Status: AC
  Administered 2012-11-23: 60 mg via INTRAMUSCULAR
  Filled 2012-11-23: qty 2

## 2012-11-23 MED ORDER — TRAMADOL HCL 50 MG PO TABS: 50.0000 mg | ORAL_TABLET | Freq: Four times a day (QID) | ORAL | Status: DC | PRN

## 2012-11-23 MED ORDER — NAPROXEN 500 MG PO TABS: 500.0000 mg | ORAL_TABLET | Freq: Two times a day (BID) | ORAL | Status: DC

## 2012-11-23 NOTE — ED Provider Notes (Signed)
CSN: [id number]     Arrival date & time 11/23/12  1117 History     First MD Initiated Contact with Patient 11/23/12 1134     Chief Complaint  Patient presents with  . Nausea   (Consider location/radiation/quality/duration/timing/severity/associated sxs/prior Treatment) HPI Comments: Patient with history of Graves' disease status post radioactive iodine, chronic joint pain -- presents with complaint of worsening joint pain and left-sided chest pain for the past several days. Patient states that she began getting chest pain when she was diagnosed with Graves' disease approximately one year ago. Pain is worse with palpation and deep breathing. She endorses subjective shortness of breath, but states that she takes albuterol inhaler when she feels this way has recently been out. No fevers, chills, nausea, vomiting. Patient has had several episodes of diarrhea in the past 3 days. No urinary symptoms. No injury to her chest. Patient denies lower extremity swelling, estrogen use, recent surgeries or mobilizations, history of blood clots. The onset of this condition was acute. The course is constant. Alleviating factors: none.    The history is provided by the patient.    Past Medical History  Diagnosis Date  . Anemia     all 3 pregnancies  . Hyperemesis     3rd pregnancy  . Gestational hypertension 2010    with 3rd pregnancy  . Preterm labor     with 3rd pregnancy  . Asthma     albuterol inhaler, last used one year ago  . Thyroid disease     had radioactive iodine  . Grave's disease    Past Surgical History  Procedure Laterality Date  . Dilation and curettage of uterus  2010  . Laparoscopic tubal ligation  08/10/2011    Procedure: LAPAROSCOPIC TUBAL LIGATION;  Surgeon: Tereso Newcomer, MD;  Location: [facility];  Service: Gynecology;  Laterality: Bilateral;   Family History  Problem Relation Age of Onset  . Hypertension Father   . Hypertension Mother   . Anemia Mother   . Bipolar  disorder Mother   . Anesthesia problems Neg Hx   . Hypotension Neg Hx   . Malignant hyperthermia Neg Hx   . Pseudochol deficiency Neg Hx   . Thyroid disease Maternal Aunt   . Bipolar disorder Maternal Aunt   . Diabetes Maternal Uncle   . Diabetes Maternal Grandmother   . Diabetes Paternal Grandmother   . Cancer Paternal Grandmother    History  Substance Use Topics  . Smoking status: Former Smoker -- 0.25 packs/day for .5 years    Types: Cigarettes    Quit date: 11/18/2011  . Smokeless tobacco: Never Used  . Alcohol Use: No   OB History   Grav Para Term Preterm Abortions TAB SAB Ect Mult Living   5 4 3 1 1  1   4      Review of Systems  Constitutional: Negative for fever and diaphoresis.  HENT: Negative for neck pain.   Eyes: Negative for redness.  Respiratory: Positive for shortness of breath. Negative for cough.   Cardiovascular: Positive for chest pain. Negative for palpitations and leg swelling.  Gastrointestinal: Negative for nausea, vomiting and abdominal pain.  Genitourinary: Negative for dysuria.  Musculoskeletal: Negative for back pain.  Skin: Negative for rash.  Neurological: Negative for syncope and light-headedness.    Allergies  Shellfish allergy  Home Medications   Current Outpatient Rx  Name  Route  Sig  Dispense  Refill  . albuterol (PROVENTIL HFA;VENTOLIN HFA) 108 (90 BASE) MCG/ACT  inhaler   Inhalation   Inhale 2 puffs into the lungs every 6 (six) hours as needed. Shortness of breath/asthma   1 Inhaler   1   . hydrochlorothiazide (HYDRODIURIL) 25 MG tablet   Oral   Take 1 tablet (25 mg total) by mouth daily.   30 tablet   11   . levothyroxine (SYNTHROID, LEVOTHROID) 150 MCG tablet   Oral   Take 1 tablet (150 mcg total) by mouth daily.   30 tablet   1   . lisinopril (PRINIVIL,ZESTRIL) 20 MG tablet   Oral   Take 1 tablet (20 mg total) by mouth daily.   90 tablet   3    BP 128/89  Pulse 100  Temp(Src) 98.6 F (37 C)  Resp 18  SpO2  98% Physical Exam  Nursing note and vitals reviewed. Constitutional: She appears well-developed and well-nourished.  HENT:  Head: Normocephalic and atraumatic.  Mouth/Throat: Mucous membranes are normal. Mucous membranes are not dry.  Eyes: Conjunctivae are normal. Pupils are equal, round, and reactive to light.  Neck: Trachea normal and normal range of motion. Neck supple. Normal carotid pulses and no JVD present. No muscular tenderness present. Carotid bruit is not present. No tracheal deviation present.  Cardiovascular: Normal rate, regular rhythm, S1 normal, S2 normal, normal heart sounds and intact distal pulses.  Exam reveals no decreased pulses.   No murmur heard. HR 78-81 on my exam. No tachycardia.   Pulmonary/Chest: Effort normal. No respiratory distress. She has no wheezes. She exhibits no tenderness.    Abdominal: Soft. Normal aorta and bowel sounds are normal. There is no tenderness. There is no rebound and no guarding.  Musculoskeletal: Normal range of motion. She exhibits no edema and no tenderness.  No calf tenderness.  Neurological: She is alert.  Skin: Skin is warm and dry. She is not diaphoretic. No cyanosis. No pallor.  Psychiatric: She has a normal mood and affect.    ED Course   Procedures (including critical care time)  Labs Reviewed - No data to display Dg Chest 2 View  11/23/2012   *RADIOLOGY REPORT*  Clinical Data: Left chest pain with tightness for 3 days.  History of asthma and Graves disease.  CHEST - 2 VIEW  Comparison: Radiographs 02/24/2012 and 05/19/2011.  Findings: There is interval improved aeration of the lungs which are now clear.  The heart size and mediastinal contours are stable. There is no pleural effusion or pneumothorax.  No acute osseous findings are seen.  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  IMPRESSION: Resolved basilar atelectasis.  No acute cardiopulmonary process.   Original Report Authenticated By: Carey Bullocks, M.D.   1. Chest  wall pain   2. Abnormal EKG     12:44 PM Patient seen and examined. Work-up initiated. Medications ordered. Pt informed of abnormal, unchanged EKG findings.   Vital signs reviewed and are as follows: Filed Vitals:   11/23/12 1142  BP: 128/89  Pulse:   Temp:   Resp: 18  BP 128/89  Pulse 100  Temp(Src) 98.6 F (37 C)  Resp 18  SpO2 98%   Date: 11/23/2012  Rate: 92  Rhythm: normal sinus rhythm  QRS Axis: normal  Intervals: normal  ST/T Wave abnormalities: nonspecific T wave changes  Conduction Disutrbances:none  Narrative Interpretation:   Old EKG Reviewed: unchanged from 02/24/12   2:12 PM Patient starting to feel better after toradol. Patient informed of chest x-ray findings.  Patient counseled on use of narcotic pain  medications. Counseled not to combine these medications with others containing tylenol. Urged not to drink alcohol, drive, or perform any other activities that requires focus while taking these medications. The patient verbalizes understanding and agrees with the plan.  Patient was counseled to return with severe chest pain, especially if the pain is crushing or pressure-like and spreads to the arms, back, neck, or jaw, or if they have sweating, nausea, or shortness of breath with the pain. They were encouraged to call 911 with these symptoms.   They were also told to return if their chest pain gets worse and does not go away with rest, they have an attack of chest pain lasting longer than usual despite rest and treatment with the medications their caregiver has prescribed, if they wake from sleep with chest pain or shortness of breath, if they feel dizzy or faint, if they have chest pain not typical of their usual pain, or if they have any other emergent concerns regarding their health.  The patient verbalized understanding and agreed.    MDM  Patient with chest pain.  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 CXR neg. Do not suspect ACS. Pt is PERC negative. Do not suspect pericarditis with this history. She appears well, improved. Stable for d/c with PCP f/u.   Renne Crigler, PA-C 11/23/12 1414

## 2012-11-23 NOTE — ED Notes (Addendum)
Nausea and body aches since yesterday that come and go having some cp also states cannot  be prg had tubes tied

## 2012-11-23 NOTE — ED Provider Notes (Signed)
Medical screening examination/treatment/procedure(s) were performed by non-physician practitioner and as supervising physician I was immediately available for consultation/collaboration.  Ethelda Chick, MD 11/23/12 1415

## 2013-08-28 ENCOUNTER — Ambulatory Visit (INDEPENDENT_AMBULATORY_CARE_PROVIDER_SITE_OTHER): Payer: Medicaid Other | Admitting: Family Medicine

## 2013-08-28 ENCOUNTER — Encounter: Payer: Self-pay | Admitting: Family Medicine

## 2013-08-28 VITALS — BP 137/98 | HR 64 | Temp 98.5°F | Ht 65.0 in | Wt 218.4 lb

## 2013-08-28 DIAGNOSIS — E079 Disorder of thyroid, unspecified: Secondary | ICD-10-CM

## 2013-08-28 DIAGNOSIS — F0630 Mood disorder due to known physiological condition, unspecified: Secondary | ICD-10-CM

## 2013-08-28 DIAGNOSIS — Z308 Encounter for other contraceptive management: Secondary | ICD-10-CM

## 2013-08-28 DIAGNOSIS — I10 Essential (primary) hypertension: Secondary | ICD-10-CM

## 2013-08-28 DIAGNOSIS — O99285 Endocrine, nutritional and metabolic diseases complicating the puerperium: Secondary | ICD-10-CM

## 2013-08-28 DIAGNOSIS — O905 Postpartum thyroiditis: Secondary | ICD-10-CM

## 2013-08-28 DIAGNOSIS — E069 Thyroiditis, unspecified: Secondary | ICD-10-CM

## 2013-08-28 LAB — CBC
HEMATOCRIT: 38.4 % (ref 36.0–46.0)
Hemoglobin: 13.2 g/dL (ref 12.0–15.0)
MCH: 31.5 pg (ref 26.0–34.0)
MCHC: 34.4 g/dL (ref 30.0–36.0)
MCV: 91.6 fL (ref 78.0–100.0)
PLATELETS: 342 10*3/uL (ref 150–400)
RBC: 4.19 MIL/uL (ref 3.87–5.11)
RDW: 14 % (ref 11.5–15.5)
WBC: 4.9 10*3/uL (ref 4.0–10.5)

## 2013-08-28 MED ORDER — HYDROCHLOROTHIAZIDE 25 MG PO TABS: 25.0000 mg | ORAL_TABLET | Freq: Every day | ORAL | Status: DC

## 2013-08-28 NOTE — Assessment & Plan Note (Signed)
Reports having Essure placed in early 2014, but never followed up for check [facility]

## 2013-08-28 NOTE — Assessment & Plan Note (Signed)
Diastolic currently elevated; - Off both HCTZ and lisinopril for months - Check CMET today - Restart HCTZ

## 2013-08-28 NOTE — Assessment & Plan Note (Signed)
Completed PHQ-9, GAD-7 and MDQ today and scanned into EPIC - Reports Hx of "Bipolar" - Has taken Seroquel in months - Return to clinic in 2-3 weeks; Will discuss questionnaire results with Dr Pascal LuxKane for consideration of referral to Mood disorder clinic

## 2013-08-28 NOTE — Patient Instructions (Signed)
It was great seeing you today.   1. I will check some labs today to check your thyroid and will call you with the results 2. I have referred you to Gynecology for Essure check  3. Restart your Hydrochlorothiazide for high blood pressure 4. I want to see you back in 2-3 weeks to discuss your mood and possible referral to behavior health specialist    Please bring all your medications to every doctors visit  Sign up for My Chart to have easy access to your labs results, and communication with your Primary care physician.  Next Appointment  Please call to make an appointment with Dr Gayla DossJoyner in 2-3 weeks   I look forward to talking with you again at our next visit. If you have any questions or concerns before then, please call the clinic at [phone].  Take Care,   Dr Wenda LowJames Hadrian Yarbrough

## 2013-08-28 NOTE — Assessment & Plan Note (Signed)
Admits to using THC and trying to cut back; Denies Tobacco use - No controller medication

## 2013-08-28 NOTE — Assessment & Plan Note (Signed)
Unsure to her current Thyroid pathology; Has been off Synthroid for several months.  - Check TSH today - Likely need to restart synthyroid

## 2013-08-28 NOTE — Progress Notes (Signed)
  Patient name: Carrie Russelllesia P Erb MRN [id number]  Date of birth: 1985/09/10  CC & HPI:  [name] is a 28 y.o. female presenting today for HTN, thyroid problems and mood disorder.   Thyroid - Hx of post-partum thyroiditis and "Grave's" - Report Hx of "radiation for thyroid" but is unsure if this was for diagnosis or treatment - Reports not taking synthroid for > 3 months.  - Denies CP, Palpitations, temperature instability, but endorses weight fluctuations and "mood problems"  CHRONIC HYPERTENSION  BP Readings from Last 3 Encounters:  08/28/13 137/98  11/23/12 104/73  07/31/12 125/80    Control: Elevated today; History of GHTN Disease Monitoring  Blood pressure range outside clinc: not checking  Chest pain: no   Dyspnea: no   Claudication: no  Medication compliance/financial difficulties: yes, not taking medication because she gets overwhelmed with her "mood problems" and doesn't follow-up with doctor visits or medications    History  Smoking status  . Former Smoker -- 0.25 packs/day for .5 years  . Types: Cigarettes  . Quit date: 11/18/2011  Smokeless tobacco  . Never Used   Mood Problems  - Reports diagnosis of bipolar disorder by previous PCP and started on Seroquel which she hasn't taken for months.  - never been evaluated by psychologist/psychiatrist - Fhx of "bipolar", depression - Endorses a great deal of stress, anger, mood problems that interfer with raising her 4 kids and keep a job - Previous suicidal ideations without attempts; No current SI - Current THC use, denies alcohol or other illicit drug use - completed PHQ-9, GAD-7 and MDQ and scanned into chart  Essure - reports having essure placed in early 2014, but never followed up to have it checked  ROS: See HPI above otherwise negative.  Medical & Surgical Hx:  Reviewed.  Medications & Allergies: Reviewed & Updated - see associated section Social History: Reviewed:   Objective Findings:  Vitals: BP  137/98  Pulse 64  Temp(Src) 98.5 F (36.9 C) (Oral)  Ht 5\' 5"  (1.651 m)  Wt 218 lb 6.4 oz (99.066 kg)  BMI 36.34 kg/m2  LMP 08/19/2013  Gen: Tearful; Obese Neck: Enlarge thyroid  CV: RRR w/o m/r/g, pulses +2 b/l Resp: CTAB w/ normal respiratory effort LE: trace pitting edema  Assessment & Plan:   Please See Problem Focused Assessment & Plan

## 2013-08-29 ENCOUNTER — Other Ambulatory Visit: Payer: Self-pay | Admitting: Family Medicine

## 2013-08-29 LAB — LIPID PANEL
Cholesterol: 241 mg/dL — ABNORMAL HIGH (ref 0–200)
HDL: 51 mg/dL (ref >39)
LDL Cholesterol: 174 mg/dL — ABNORMAL HIGH (ref 0–99)
Total CHOL/HDL Ratio: 4.7 Ratio
Triglycerides: 81 mg/dL (ref <150)
VLDL: 16 mg/dL (ref 0–40)

## 2013-08-29 LAB — COMPREHENSIVE METABOLIC PANEL
ALBUMIN: 4.3 g/dL (ref 3.5–5.2)
ALT: 20 U/L (ref 0–35)
AST: 34 U/L (ref 0–37)
Alkaline Phosphatase: 49 U/L (ref 39–117)
BUN: 8 mg/dL (ref 6–23)
CALCIUM: 9 mg/dL (ref 8.4–10.5)
CHLORIDE: 103 meq/L (ref 96–112)
CO2: 25 mEq/L (ref 19–32)
Creat: 0.99 mg/dL (ref 0.50–1.10)
Glucose, Bld: 78 mg/dL (ref 70–99)
POTASSIUM: 3.3 meq/L — AB (ref 3.5–5.3)
Sodium: 140 mEq/L (ref 135–145)
Total Bilirubin: 0.5 mg/dL (ref 0.2–1.2)
Total Protein: 7.3 g/dL (ref 6.0–8.3)

## 2013-08-29 LAB — TSH: TSH: 101.379 u[IU]/mL — AB (ref 0.350–4.500)

## 2013-08-31 ENCOUNTER — Emergency Department (HOSPITAL_COMMUNITY)
Admission: EM | Admit: 2013-08-31 | Discharge: 2013-08-31 | Disposition: A | Discharge status: Home / Self Care | Payer: Medicaid Other | Attending: Emergency Medicine | Admitting: Emergency Medicine

## 2013-08-31 ENCOUNTER — Encounter (HOSPITAL_COMMUNITY): Payer: Self-pay | Admitting: Emergency Medicine

## 2013-08-31 DIAGNOSIS — Z79899 Other long term (current) drug therapy: Secondary | ICD-10-CM | POA: Insufficient documentation

## 2013-08-31 DIAGNOSIS — M549 Dorsalgia, unspecified: Secondary | ICD-10-CM

## 2013-08-31 DIAGNOSIS — J45909 Unspecified asthma, uncomplicated: Secondary | ICD-10-CM | POA: Insufficient documentation

## 2013-08-31 DIAGNOSIS — M62838 Other muscle spasm: Secondary | ICD-10-CM | POA: Insufficient documentation

## 2013-08-31 DIAGNOSIS — Z8632 Personal history of gestational diabetes: Secondary | ICD-10-CM | POA: Insufficient documentation

## 2013-08-31 DIAGNOSIS — M542 Cervicalgia: Secondary | ICD-10-CM | POA: Insufficient documentation

## 2013-08-31 DIAGNOSIS — Z8719 Personal history of other diseases of the digestive system: Secondary | ICD-10-CM | POA: Insufficient documentation

## 2013-08-31 DIAGNOSIS — E0500 Thyrotoxicosis with diffuse goiter without thyrotoxic crisis or storm: Secondary | ICD-10-CM | POA: Insufficient documentation

## 2013-08-31 DIAGNOSIS — Z862 Personal history of diseases of the blood and blood-forming organs and certain disorders involving the immune mechanism: Secondary | ICD-10-CM | POA: Insufficient documentation

## 2013-08-31 DIAGNOSIS — Z87891 Personal history of nicotine dependence: Secondary | ICD-10-CM | POA: Insufficient documentation

## 2013-08-31 MED ORDER — DIAZEPAM 5 MG PO TABS: 5.0000 mg | ORAL_TABLET | Freq: Three times a day (TID) | ORAL | Status: DC | PRN

## 2013-08-31 MED ORDER — DIAZEPAM 5 MG/ML IJ SOLN
5.0000 mg | Freq: Once | INTRAMUSCULAR | Status: AC
  Administered 2013-08-31: 5 mg via INTRAVENOUS
  Filled 2013-08-31: qty 2

## 2013-08-31 MED ORDER — IBUPROFEN 400 MG PO TABS
800.0000 mg | ORAL_TABLET | Freq: Once | ORAL | Status: AC
  Administered 2013-08-31: 800 mg via ORAL
  Filled 2013-08-31: qty 2

## 2013-08-31 MED ORDER — IBUPROFEN 800 MG PO TABS: 800.0000 mg | ORAL_TABLET | Freq: Three times a day (TID) | ORAL | Status: DC

## 2013-08-31 NOTE — ED Notes (Signed)
She c/o neck and back pain today. She is afraid it is related to her thyroid but she has no other complaints. She denies any injuries, bowel/bladder changes. She is ambulatory, mae

## 2013-08-31 NOTE — Discharge Instructions (Signed)
Back Pain, Adult Low back pain is very common. About 1 in 5 people have back pain.The cause of low back pain is rarely dangerous. The pain often gets better over time.About half of people with a sudden onset of back pain feel better in just 2 weeks. About 8 in 10 people feel better by 6 weeks.  CAUSES Some common causes of back pain include:  Strain of the muscles or ligaments supporting the spine.  Wear and tear (degeneration) of the spinal discs.  Arthritis.  Direct injury to the back. DIAGNOSIS Most of the time, the direct cause of low back pain is not known.However, back pain can be treated effectively even when the exact cause of the pain is unknown.Answering your caregiver's questions about your overall health and symptoms is one of the most accurate ways to make sure the cause of your pain is not dangerous. If your caregiver needs more information, he or she may order lab work or imaging tests (X-rays or MRIs).However, even if imaging tests show changes in your back, this usually does not require surgery. HOME CARE INSTRUCTIONS For many people, back pain returns.Since low back pain is rarely dangerous, it is often a condition that people can learn to [facility] their own.   Remain active. It is stressful on the back to sit or stand in one place. Do not sit, drive, or stand in one place for more than 30 minutes at a time. Take short walks on level surfaces as soon as pain allows.Try to increase the length of time you walk each day.  Do not stay in bed.Resting more than 1 or 2 days can delay your recovery.  Do not avoid exercise or work.Your body is made to move.It is not dangerous to be active, even though your back may hurt.Your back will likely heal faster if you return to being active before your pain is gone.  Pay attention to your body when you bend and lift. Many people have less discomfortwhen lifting if they bend their knees, keep the load close to their bodies,and  avoid twisting. Often, the most comfortable positions are those that put less stress on your recovering back.  Find a comfortable position to sleep. Use a firm mattress and lie on your side with your knees slightly bent. If you lie on your back, put a pillow under your knees.  Only take over-the-counter or prescription medicines as directed by your caregiver. Over-the-counter medicines to reduce pain and inflammation are often the most helpful.Your caregiver may prescribe muscle relaxant drugs.These medicines help dull your pain so you can more quickly return to your normal activities and healthy exercise.  Put ice on the injured area.  Put ice in a plastic bag.  Place a towel between your skin and the bag.  Leave the ice on for 15-20 minutes, 03-04 times a day for the first 2 to 3 days. After that, ice and heat may be alternated to reduce pain and spasms.  Ask your caregiver about trying back exercises and gentle massage. This may be of some benefit.  Avoid feeling anxious or stressed.Stress increases muscle tension and can worsen back pain.It is important to recognize when you are anxious or stressed and learn ways to manage it.Exercise is a great option. SEEK MEDICAL CARE IF:  You have pain that is not relieved with rest or medicine.  You have pain that does not improve in 1 week.  You have new symptoms.  You are generally not feeling well. SEEK  IMMEDIATE MEDICAL CARE IF:  °· You have pain that radiates from your back into your legs. °· You develop new bowel or bladder control problems. °· You have unusual weakness or numbness in your arms or legs. °· You develop nausea or vomiting. °· You develop abdominal pain. °· You feel faint. °Document Released: 04/05/2005 Document Revised: 10/05/2011 Document Reviewed: 08/24/2010 °ExitCare® Patient Information ©2014 [employer]. ° °Muscle Cramps and Spasms °Muscle cramps and spasms occur when a muscle or muscles tighten and you have no  control over this tightening (involuntary muscle contraction). They are a common problem and can develop in any muscle. The most common place is in the calf muscles of the leg. Both muscle cramps and muscle spasms are involuntary muscle contractions, but they also have differences:  °· Muscle cramps are sporadic and painful. They may last a few seconds to a quarter of an hour. Muscle cramps are often more forceful and last longer than muscle spasms. °· Muscle spasms may or may not be painful. They may also last just a few seconds or much longer. °CAUSES  °It is uncommon for cramps or spasms to be due to a serious underlying problem. In many cases, the cause of cramps or spasms is unknown. Some common causes are:  °· Overexertion.   °· Overuse from repetitive motions (doing the same thing over and over).   °· Remaining in a certain position for a long period of time.   °· Improper preparation, form, or technique while performing a sport or activity.   °· Dehydration.   °· Injury.   °· Side effects of some medicines.   °· Abnormally low levels of the salts and ions in your blood (electrolytes), especially potassium and calcium. This could happen if you are taking water pills (diuretics) or you are pregnant.   °Some underlying medical problems can make it more likely to develop cramps or spasms. These include, but are not limited to:  °· Diabetes.   °· Parkinson disease.   °· Hormone disorders, such as thyroid problems.   °· Alcohol abuse.   °· Diseases specific to muscles, joints, and bones.   °· Blood vessel disease where not enough blood is getting to the muscles.   °HOME CARE INSTRUCTIONS  °· Stay well hydrated. Drink enough water and fluids to keep your urine clear or pale yellow. °· It may be helpful to massage, stretch, and relax the affected muscle. °· For tight or tense muscles, use a warm towel, heating pad, or hot shower water directed to the affected area. °· If you are sore or have pain after a cramp or  spasm, applying ice to the affected area may relieve discomfort. °· Put ice in a plastic bag. °· Place a towel between your skin and the bag. °· Leave the ice on for 15-20 minutes, 03-04 times a day. °· Medicines used to treat a known cause of cramps or spasms may help reduce their frequency or severity. Only take over-the-counter or prescription medicines as directed by your caregiver. °SEEK MEDICAL CARE IF:  °Your cramps or spasms get more severe, more frequent, or do not improve over time.  °MAKE SURE YOU:  °· Understand these instructions. °· Will watch your condition. °· Will get help right away if you are not doing well or get worse. °Document Released: 09/25/2001 Document Revised: 07/31/2012 Document Reviewed: 03/22/2012 °ExitCare® Patient Information ©2014 [employer]. ° °

## 2013-08-31 NOTE — ED Provider Notes (Signed)
CSN: [id number]     Arrival date & time 08/31/13  1537 History  This chart was scribed for Roxy Horsemanobert Lucilia Yanni, PA, working with Flint MelterElliott L Wentz, MD, by Ardelia Memsylan Malpass ED Scribe. This patient was seen in room TR11C/TR11C and the patient's care was started at 4:18 PM.   Chief Complaint  Patient presents with  . Back Pain    The history is provided by the patient. No language interpreter was used.    HPI Comments: [name] is a 28 y.o. female with a history of thyroid disease and HTN who presents to the Emergency Department with a chief complaint of constant, moderate neck pain onset last night. She describes her pain as "shooting" and as "stiffness", and states that her pain radiates throughout her back. She states that the pain was worsened today when she bent over. She states that her pain is worsened with movement and with walking. She states that she has not tried any medications for her pain. She denies fever, bowel or bladder incontinence. She states that she has never had surgery on her back. She states that she last saw her PCP 2 days ago, and she was started on blood pressure medications.   PCP- Dr. Wenda LowJames Joyner   Past Medical History  Diagnosis Date  . Anemia     all 3 pregnancies  . Hyperemesis     3rd pregnancy  . Gestational hypertension 2010    with 3rd pregnancy  . Preterm labor     with 3rd pregnancy  . Asthma     albuterol inhaler, last used one year ago  . Thyroid disease     had radioactive iodine  . Grave's disease    Past Surgical History  Procedure Laterality Date  . Dilation and curettage of uterus  2010  . Laparoscopic tubal ligation  08/10/2011    Procedure: LAPAROSCOPIC TUBAL LIGATION;  Surgeon: Tereso NewcomerUgonna A Anyanwu, MD;  Location: [facility];  Service: Gynecology;  Laterality: Bilateral;   Family History  Problem Relation Age of Onset  . Hypertension Father   . Hypertension Mother   . Anemia Mother   . Bipolar disorder Mother   . Anesthesia problems Neg  Hx   . Hypotension Neg Hx   . Malignant hyperthermia Neg Hx   . Pseudochol deficiency Neg Hx   . Thyroid disease Maternal Aunt   . Bipolar disorder Maternal Aunt   . Diabetes Maternal Uncle   . Diabetes Maternal Grandmother   . Diabetes Paternal Grandmother   . Cancer Paternal Grandmother    History  Substance Use Topics  . Smoking status: Former Smoker -- 0.25 packs/day for .5 years    Types: Cigarettes    Quit date: 11/18/2011  . Smokeless tobacco: Never Used  . Alcohol Use: No   OB History   Grav Para Term Preterm Abortions TAB SAB Ect Mult Living   5 4 3 1 1  1   4      Review of Systems  Constitutional: Negative for fever and chills.  Gastrointestinal:       Denies bowel incontinence  Genitourinary:       Denies bladder incontinence  Musculoskeletal: Positive for arthralgias, back pain, myalgias and neck pain.  Neurological: Negative for weakness and numbness.       No saddle anesthesia    Allergies  Shellfish allergy  Home Medications   Prior to Admission medications   Medication Sig Start Date End Date Taking? Authorizing Provider  albuterol (PROVENTIL HFA;VENTOLIN  HFA) 108 (90 BASE) MCG/ACT inhaler Inhale 2 puffs into the lungs every 6 (six) hours as needed. Shortness of breath/asthma 07/13/12   Ardyth Galachel Chamberlain, MD  hydrochlorothiazide (HYDRODIURIL) 25 MG tablet Take 1 tablet (25 mg total) by mouth daily. 08/28/13   Wenda LowJames Joyner, MD  levothyroxine (SYNTHROID, LEVOTHROID) 150 MCG tablet Take 1 tablet (150 mcg total) by mouth daily. 07/13/12 07/13/13  Ardyth Galachel Chamberlain, MD  lisinopril (PRINIVIL,ZESTRIL) 20 MG tablet Take 1 tablet (20 mg total) by mouth daily. 07/06/12   Ardyth Galachel Chamberlain, MD   Triage Vitals: BP 148/97  Pulse 70  Temp(Src) 98.3 F (36.8 C) (Oral)  Resp 18  SpO2 98%  LMP 08/19/2013  Physical Exam  Nursing note and vitals reviewed. Constitutional: She is oriented to person, place, and time. She appears well-developed and well-nourished. No  distress.  HENT:  Head: Normocephalic and atraumatic.  Eyes: Conjunctivae and EOM are normal. Right eye exhibits no discharge. Left eye exhibits no discharge. No scleral icterus.  Neck: Normal range of motion. Neck supple. No tracheal deviation present.  Cardiovascular: Normal rate, regular rhythm and normal heart sounds.  Exam reveals no gallop and no friction rub.   No murmur heard. Pulmonary/Chest: Effort normal and breath sounds normal. No respiratory distress. She has no wheezes.  Abdominal: Soft. She exhibits no distension. There is no tenderness.  Musculoskeletal: Normal range of motion.  Lumbar paraspinal muscles tender to palpation, no bony tenderness, step-offs, or gross abnormality or deformity of spine, patient is able to ambulate, moves all extremities  Bilateral great toe extension intact Bilateral plantar/dorsiflexion intact  Neurological: She is alert and oriented to person, place, and time. She has normal reflexes.  Sensation and strength intact bilaterally Symmetrical reflexes  Skin: Skin is warm. She is not diaphoretic.  Psychiatric: She has a normal mood and affect. Her behavior is normal. Judgment and thought content normal.    ED Course  Procedures (including critical care time)  DIAGNOSTIC STUDIES: Oxygen Saturation is 98% on RA, normal by my interpretation.    COORDINATION OF CARE: 4:21 PM- Advised pt that her pain is musculoskeletal in nature. Discussed plan for at home care, including plan to discharge with prescriptions for Valium and Motrin, and plan for pt to apply ice and heat to her back as needed. Pt advised of plan for treatment and pt agrees.  Labs Review Labs Reviewed - No data to display  Imaging Review No results found.   EKG Interpretation None      MDM   Final diagnoses:  Back pain  Muscle spasm    Patient with back pain.  No neurological deficits and normal neuro exam.  Patient is ambulatory.  No loss of bowel or bladder control.   Doubt cauda equina.  Denies fever,  doubt epidural abscess or other lesion. Recommend back exercises, stretching, RICE, and will treat with a short course of ibuprofen and valium.  Encouraged the patient that there could be a need for additional workup and/or imaging such as MRI, if the symptoms do not resolve. Patient advised that if the back pain does not resolve, or radiates, this could progress to more serious conditions and is encouraged to follow-up with PCP or orthopedics within 2 weeks.    I personally performed the services described in this documentation, which was scribed in my presence. The recorded information has been reviewed and is accurate.    Roxy Horsemanobert Loudon Krakow, PA-C 08/31/13 [id number]

## 2013-09-01 NOTE — ED Provider Notes (Signed)
Medical screening examination/treatment/procedure(s) were performed by non-physician practitioner and as supervising physician I was immediately available for consultation/collaboration.  Eudora Guevarra L Everado Pillsbury, MD 09/01/13 0130 

## 2013-09-03 ENCOUNTER — Other Ambulatory Visit: Payer: Self-pay | Admitting: Family Medicine

## 2013-09-03 DIAGNOSIS — E039 Hypothyroidism, unspecified: Secondary | ICD-10-CM

## 2013-09-03 DIAGNOSIS — M549 Dorsalgia, unspecified: Secondary | ICD-10-CM

## 2013-09-03 MED ORDER — CYCLOBENZAPRINE HCL 5 MG PO TABS: 5.0000 mg | ORAL_TABLET | Freq: Three times a day (TID) | ORAL | Status: DC | PRN

## 2013-09-03 MED ORDER — LEVOTHYROXINE SODIUM 150 MCG PO TABS: 150.0000 ug | ORAL_TABLET | Freq: Every day | ORAL | Status: DC

## 2013-09-03 NOTE — Progress Notes (Signed)
Called and discussed high TSH with patient. Restarted Synthroid at previously prescribed dose. She was upset about her recent ED visit for Back pain, and still complaining of migrating neck and back pain. Called in Rx for flexeril, and advised heating pads and stretching. Will f/u on 09/11/13

## 2013-09-11 ENCOUNTER — Ambulatory Visit (INDEPENDENT_AMBULATORY_CARE_PROVIDER_SITE_OTHER): Payer: Medicaid Other | Admitting: Family Medicine

## 2013-09-11 ENCOUNTER — Ambulatory Visit (HOSPITAL_COMMUNITY)
Admission: RE | Admit: 2013-09-11 | Discharge: 2013-09-11 | Disposition: A | Discharge status: Home / Self Care | Payer: Medicaid Other | Source: Ambulatory Visit | Attending: Family Medicine | Admitting: Family Medicine

## 2013-09-11 DIAGNOSIS — E079 Disorder of thyroid, unspecified: Secondary | ICD-10-CM

## 2013-09-11 DIAGNOSIS — I10 Essential (primary) hypertension: Secondary | ICD-10-CM

## 2013-09-11 DIAGNOSIS — F0630 Mood disorder due to known physiological condition, unspecified: Secondary | ICD-10-CM

## 2013-09-11 DIAGNOSIS — R079 Chest pain, unspecified: Secondary | ICD-10-CM | POA: Insufficient documentation

## 2013-09-11 DIAGNOSIS — O905 Postpartum thyroiditis: Secondary | ICD-10-CM

## 2013-09-11 DIAGNOSIS — E069 Thyroiditis, unspecified: Secondary | ICD-10-CM

## 2013-09-11 DIAGNOSIS — O99285 Endocrine, nutritional and metabolic diseases complicating the puerperium: Secondary | ICD-10-CM

## 2013-09-11 NOTE — Assessment & Plan Note (Signed)
She reports calling Dr Pascal Lux and is waiting for call back about apt - Gave Dr Carola Rhine information again today, and advised to call if hasn't heard back after this week

## 2013-09-11 NOTE — Assessment & Plan Note (Signed)
Reports compliance with Synthroid - Will recheck TSH in 4 weeks

## 2013-09-11 NOTE — Patient Instructions (Signed)
It was great seeing you today.   1. Your blood pressure is great today: Continue taking your synthroid and hydrochlorothiazide  2. Return to clinic in 4 weeks for check of your thyroid function 3. Call Dr Pascal Lux to confirm your visit if you don't hear from her this week. Please call [phone] to schedule an appointment with Psychologist Spero Geralds   Please bring all your medications to every doctors visit  Sign up for My Chart to have easy access to your labs results, and communication with your Primary care physician.  Next Appointment  Please call to make an appointment with Dr Gayla Doss in  4 weeks   I look forward to talking with you again at our next visit. If you have any questions or concerns before then, please call the clinic at [phone].  Take Care,   Dr Wenda Low

## 2013-09-11 NOTE — Progress Notes (Signed)
   Subjective:    Patient ID: [name], female    DOB: July 15, 1985, 28 y.o.   MRN: [id number]  HPI Comments: She reports chest pain 7/10 "pressure" that began suddenly yesterday around 3 PM.  This was associated with sweating and shortness of breath, but resolved after lying down for several hours.  The pain was not associated with exertion, position or breathing.  She denies any recent fevers, or viral symptoms. She denies any nausea, palpitations, heartburn.  She reports a lot of stress recently in her life: Trying to find a new job, get housing for her and her kids, and get her medical and "mood" problems, under control.  She denies any illicit drug use, and reports recently quitting marijuana.      Review of Systems     Objective:   Physical Exam  Constitutional: She appears well-developed and well-nourished.  Cardiovascular: Normal rate, regular rhythm and normal heart sounds.  Exam reveals no friction rub.   No murmur heard. Pulmonary/Chest: Effort normal and breath sounds normal. No respiratory distress. She has no wheezes. She exhibits tenderness.   Assessment/Plan:      See Problem Focused Assessment & Plan

## 2013-09-11 NOTE — Assessment & Plan Note (Signed)
Better controlled today - No changes; reassess in 1 month

## 2013-09-11 NOTE — Assessment & Plan Note (Signed)
Chest pain not concerning for cardiac etiology - EKG wnl - Chest wall tenderness to palpation - Pain already improving - No further work-up or treatment currently needed

## 2013-10-03 ENCOUNTER — Ambulatory Visit: Payer: Medicaid Other | Admitting: Family Medicine

## 2013-11-22 ENCOUNTER — Ambulatory Visit (INDEPENDENT_AMBULATORY_CARE_PROVIDER_SITE_OTHER): Payer: Self-pay | Admitting: Family Medicine

## 2013-11-22 ENCOUNTER — Encounter: Payer: Self-pay | Admitting: Family Medicine

## 2013-11-22 VITALS — BP 120/60 | HR 64 | Ht 65.0 in | Wt 211.0 lb

## 2013-11-22 DIAGNOSIS — E079 Disorder of thyroid, unspecified: Secondary | ICD-10-CM

## 2013-11-22 DIAGNOSIS — I10 Essential (primary) hypertension: Secondary | ICD-10-CM

## 2013-11-22 DIAGNOSIS — E039 Hypothyroidism, unspecified: Secondary | ICD-10-CM

## 2013-11-22 DIAGNOSIS — O99285 Endocrine, nutritional and metabolic diseases complicating the puerperium: Secondary | ICD-10-CM

## 2013-11-22 DIAGNOSIS — E069 Thyroiditis, unspecified: Secondary | ICD-10-CM

## 2013-11-22 DIAGNOSIS — M542 Cervicalgia: Secondary | ICD-10-CM

## 2013-11-22 DIAGNOSIS — O905 Postpartum thyroiditis: Secondary | ICD-10-CM

## 2013-11-22 LAB — TSH: TSH: 61.602 u[IU]/mL — ABNORMAL HIGH (ref 0.350–4.500)

## 2013-11-22 MED ORDER — HYDROCHLOROTHIAZIDE 25 MG PO TABS: 25.0000 mg | ORAL_TABLET | Freq: Every day | ORAL | Status: DC

## 2013-11-22 MED ORDER — LEVOTHYROXINE SODIUM 150 MCG PO TABS: 150.0000 ug | ORAL_TABLET | Freq: Every day | ORAL | Status: DC

## 2013-11-22 MED ORDER — LISINOPRIL 20 MG PO TABS: 20.0000 mg | ORAL_TABLET | Freq: Every day | ORAL | Status: DC

## 2013-11-22 NOTE — Progress Notes (Signed)
   Subjective:    Patient ID: Tyron RussellAlesia P Fairburn, female    DOB: 09-Aug-1985, 28 y.o.   MRN: [id number]  HPI Comments: Ms. Cherly BeachSomers comes in today for follow-up of hypothyroid and HTN, as well as new complaints of swelling, pain with questions about disability.   Hypothyroid - She admits to missing doses of her Synthroid, 1-2 days a week.  When she does take her Synthroid.  She takes in the morning without food or drink for 30 minutes.  She continues to endorse multiple symptoms consistent with hyperthyroidism: Swelling, cold intolerance, palpitations, and depression.  She denies any chest pain.   Swelling - She feels like her her whole body is swollen. She continues to occasionally have chest discomfort we previously discussed and evaluated. She denies any chest pain or SOB but does complain of fatigue.   Neck pain - She complains of posterior neck stiffness and pain she describes as achy and tension. She denies any previous trauma or surgeries. 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 She reports that the pain prevents her from doing her job.  She is upset that she is unable to get less physically demanding jobs and is curious about applying for disability.  She denies any fevers, chills, night sweats, or weight loss.  CHRONIC HYPERTENSION  BP Readings from Last 3 Encounters: 11/22/13 : 120/60 08/31/13 : 141/101 08/28/13 : 137/98  Control: good Disease Monitoring  Blood pressure range outside clinc: not monitoring  Chest pain: no   Dyspnea: no   Claudication: no  Medication compliance/financial difficulties: yes  Medication Side Effects: Denies Dizziness/lightheadedness;    Preventitive Healthcare:    Smoking Status: Former Smoker                   Packs/Day: 0.25  Years: .5       Types: Cigarettes     Quit date: 11/18/2011   Review of Systems See HPI     Objective:   Physical Exam    Constitutional: She appears well-developed and well-nourished.  Neck: Normal range of motion and full passive range of motion without pain. Neck supple. Muscular tenderness present. No spinous process tenderness present. No edema, no erythema and normal range of motion present.  Cardiovascular: Normal rate and regular rhythm.   No LE edema   Pulmonary/Chest: Effort normal and breath sounds normal.   Assessment/Plan:      See Problem Focused Assessment & Plan

## 2013-11-22 NOTE — Assessment & Plan Note (Signed)
-   Likely tension/stress related possibly complicated by noncompliance with thyroid medication. 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

## 2013-11-22 NOTE — Assessment & Plan Note (Signed)
Reports taking Synthroid ~5/7 days a week - re stressed the importance of compliance with levothyroxin, and the effects of her poorly controleld hypothyroidism on most of her other complaints: swelling, mood, pain, HTN - refilled levothyroxin: $10 for 90 day supply at [facility] - Followup in 6 weeks;  Will recheck TSH

## 2013-11-22 NOTE — Assessment & Plan Note (Signed)
Well controlled today  Continue current therapy

## 2013-11-23 ENCOUNTER — Telehealth: Payer: Self-pay | Admitting: Family Medicine

## 2013-11-23 NOTE — Telephone Encounter (Addendum)
Patient need a note that due to her medical condition she is required to sit while working.  And that she need this note to be dated from 11/18/13.   Patient to call back with fax number for employer.  Please fax note to [phone] attn Jobie QuakerAngel Walker with [email or web address].

## 2013-12-30 ENCOUNTER — Emergency Department (HOSPITAL_COMMUNITY)
Admission: EM | Admit: 2013-12-30 | Discharge: 2013-12-30 | Disposition: A | Discharge status: Home / Self Care | Payer: Medicaid Other | Attending: Emergency Medicine | Admitting: Emergency Medicine

## 2013-12-30 ENCOUNTER — Emergency Department (HOSPITAL_COMMUNITY): Payer: Medicaid Other

## 2013-12-30 ENCOUNTER — Encounter (HOSPITAL_COMMUNITY): Payer: Self-pay | Admitting: Emergency Medicine

## 2013-12-30 DIAGNOSIS — Z760 Encounter for issue of repeat prescription: Secondary | ICD-10-CM

## 2013-12-30 DIAGNOSIS — W1809XA Striking against other object with subsequent fall, initial encounter: Secondary | ICD-10-CM | POA: Insufficient documentation

## 2013-12-30 DIAGNOSIS — Z862 Personal history of diseases of the blood and blood-forming organs and certain disorders involving the immune mechanism: Secondary | ICD-10-CM | POA: Insufficient documentation

## 2013-12-30 DIAGNOSIS — S20219A Contusion of unspecified front wall of thorax, initial encounter: Secondary | ICD-10-CM | POA: Insufficient documentation

## 2013-12-30 DIAGNOSIS — S298XXA Other specified injuries of thorax, initial encounter: Secondary | ICD-10-CM | POA: Insufficient documentation

## 2013-12-30 DIAGNOSIS — Z87891 Personal history of nicotine dependence: Secondary | ICD-10-CM | POA: Insufficient documentation

## 2013-12-30 DIAGNOSIS — Z8639 Personal history of other endocrine, nutritional and metabolic disease: Secondary | ICD-10-CM | POA: Insufficient documentation

## 2013-12-30 DIAGNOSIS — Y9389 Activity, other specified: Secondary | ICD-10-CM | POA: Insufficient documentation

## 2013-12-30 DIAGNOSIS — Y92009 Unspecified place in unspecified non-institutional (private) residence as the place of occurrence of the external cause: Secondary | ICD-10-CM | POA: Insufficient documentation

## 2013-12-30 DIAGNOSIS — J45909 Unspecified asthma, uncomplicated: Secondary | ICD-10-CM | POA: Insufficient documentation

## 2013-12-30 DIAGNOSIS — W010XXA Fall on same level from slipping, tripping and stumbling without subsequent striking against object, initial encounter: Secondary | ICD-10-CM | POA: Insufficient documentation

## 2013-12-30 DIAGNOSIS — M940 Chondrocostal junction syndrome [Tietze]: Secondary | ICD-10-CM | POA: Insufficient documentation

## 2013-12-30 MED ORDER — IBUPROFEN 800 MG PO TABS: 800.0000 mg | ORAL_TABLET | Freq: Three times a day (TID) | ORAL | Status: DC

## 2013-12-30 MED ORDER — HYDROCHLOROTHIAZIDE 25 MG PO TABS: 25.0000 mg | ORAL_TABLET | Freq: Every day | ORAL | Status: DC

## 2013-12-30 MED ORDER — IBUPROFEN 800 MG PO TABS
800.0000 mg | ORAL_TABLET | Freq: Once | ORAL | Status: AC
  Administered 2013-12-30: 800 mg via ORAL
  Filled 2013-12-30: qty 1

## 2013-12-30 MED ORDER — LEVOTHYROXINE SODIUM 150 MCG PO TABS: 150.0000 ug | ORAL_TABLET | Freq: Every day | ORAL | Status: DC

## 2013-12-30 NOTE — ED Notes (Signed)
Patient returned from X-ray 

## 2013-12-30 NOTE — Discharge Instructions (Signed)
Chest Wall Pain Chest wall pain is pain felt in or around the chest bones and muscles. It may take up to 6 weeks to get better. It may take longer if you are active. Chest wall pain can happen on its own. Other times, things like germs, injury, coughing, or exercise can cause the pain. HOME CARE   Avoid activities that make you tired or cause pain. Try not to use your chest, belly (abdominal), or side muscles. Do not use heavy weights.  Put ice on the sore area.  Put ice in a plastic bag.  Place a towel between your skin and the bag.  Leave the ice on for 15-20 minutes for the first 2 days.  Only take medicine as told by your doctor. GET HELP RIGHT AWAY IF:   You have more pain or are very uncomfortable.  You have a fever.  Your chest pain gets worse.  You have new problems.  You feel sick to your stomach (nauseous) or throw up (vomit).  You start to sweat or feel lightheaded.  You have a cough with mucus (phlegm).  You cough up blood. MAKE SURE YOU:   Understand these instructions.  Will watch your condition.  Will get help right away if you are not doing well or get worse. Document Released: 09/22/2007 Document Revised: 06/28/2011 Document Reviewed: 11/30/2010 [facility] Patient Information 2015 [place], [place]. This information is not intended to replace advice given to you by your health care provider. Make sure you discuss any questions you have with your health care provider.  Medication Refill, Emergency Department We have refilled your medication today as a courtesy to you. It is best for your medical care, however, to take care of getting refills done through your primary caregiver's office. They have your records and can do a better job of follow-up than we can in the emergency department. On maintenance medications, we often only prescribe enough medications to get you by until you are able to see your regular caregiver. This is a more expensive way to refill  medications. In the future, please plan for refills so that you will not have to use the emergency department for this. Thank you for your help. Your help allows [place] to better take care of the daily emergencies that enter our department. Document Released: 07/23/2003 Document Revised: 06/28/2011 Document Reviewed: 07/13/2013 [facility] Patient Information 2015 [place], [place]. This information is not intended to replace advice given to you by your health care provider. Make sure you discuss any questions you have with your health care provider.

## 2013-12-30 NOTE — ED Provider Notes (Signed)
CSN: [id number]     Arrival date & time 12/30/13  1122 History   First MD Initiated Contact with Patient 12/30/13 1150     Chief Complaint  Patient presents with  . Rib Injury  . Fall  . Abdominal Pain     (Consider location/radiation/quality/duration/timing/severity/associated sxs/prior Treatment) HPI  28 year old female with history of thyroid disease, presents with 2 different complaints. Patient states she was chasing her case around the house approximately 3 days ago, tripped fell forward striking her right chest against a TV stand.  She believes she may have bruise home broke a rib. She complained of sharp pains to her right side of chest, worsening with movement and breathing. Pain is improving but has not resolved. Furthermore patient also reports that she has been without thyromegaly in and her blood pressure medication for one month and a half due to lack of funding. She worried of having low thyroid. Report generalized fatigue, having trouble sleeping, having vague abdominal discomfort, feeling nauseous, and jittery.  She denies fever, chills, severe headache, double vision, trouble breathing, back pain, or rash. She does have a primary care Dr. and has been talking to her Dr. who was trying to get her medication but she has not heard back for the past 2 weeks.  Past Medical History  Diagnosis Date  . Anemia     all 3 pregnancies  . Hyperemesis     3rd pregnancy  . Gestational hypertension 2010    with 3rd pregnancy  . Preterm labor     with 3rd pregnancy  . Asthma     albuterol inhaler, last used one year ago  . Thyroid disease     had radioactive iodine  . Grave's disease    Past Surgical History  Procedure Laterality Date  . Dilation and curettage of uterus  2010  . Laparoscopic tubal ligation  08/10/2011    Procedure: LAPAROSCOPIC TUBAL LIGATION;  Surgeon: Tereso Newcomer, MD;  Location: [facility];  Service: Gynecology;  Laterality: Bilateral;   Family History   Problem Relation Age of Onset  . Hypertension Father   . Hypertension Mother   . Anemia Mother   . Bipolar disorder Mother   . Anesthesia problems Neg Hx   . Hypotension Neg Hx   . Malignant hyperthermia Neg Hx   . Pseudochol deficiency Neg Hx   . Thyroid disease Maternal Aunt   . Bipolar disorder Maternal Aunt   . Diabetes Maternal Uncle   . Diabetes Maternal Grandmother   . Diabetes Paternal Grandmother   . Cancer Paternal Grandmother    History  Substance Use Topics  . Smoking status: Former Smoker -- 0.25 packs/day for .5 years    Types: Cigarettes    Quit date: 11/18/2011  . Smokeless tobacco: Never Used  . Alcohol Use: No   OB History   Grav Para Term Preterm Abortions TAB SAB Ect Mult Living   Review of Systems  All other systems reviewed and are negative.     Allergies  Shellfish allergy  Home Medications   Prior to Admission medications   Not on File   BP 142/114  Pulse 61  Temp(Src) 98.4 F (36.9 C) (Oral)  Resp 19  Ht  (1.676 m)  Wt 189 lb (85.73 kg)  BMI 30.52 kg/m2  SpO2 98%  LMP 12/09/2013 Physical Exam  Nursing note and vitals reviewed. Constitutional: She is oriented  to person, place, and time. She appears well-developed and well-nourished. No distress.  HENT:  Head: Atraumatic.  Eyes: Conjunctivae are normal.  Neck: Neck supple.  Cardiovascular: Normal rate and regular rhythm.   Pulmonary/Chest: Effort normal and breath sounds normal. She exhibits tenderness (tenderness to right anterior lateral chest wall on palpation without crepitus or emphysema. Mild bruising noted.).  Abdominal: Soft. Bowel sounds are normal. There is no tenderness.  Neurological: She is alert and oriented to person, place, and time.  Skin: No rash noted.  Psychiatric: She has a normal mood and affect.    ED Course  Procedures (including critical care time)  3:30 PM Patient here complaining of right wrist injury. Chest x-ray  shows no acute rib fracture. Recommend RICE as treatment.  She also have been without her thyroid and BP medication for nearly 27 months due to lack of funding.  I will consult Case Manager to see if we can set her up with a MATCH program to get her medication.  Otherwise, pt is without acute emergent condition.    4:48 PM Case manager has seen pt and provide resources.  Pt is stable for discharge.  Medication refilled.  Return precaution discussed.   Labs Review Labs Reviewed - No data to display  Imaging Review Dg Ribs Unilateral W/chest Right  12/30/2013   CLINICAL DATA:  Playing with shoulder and fell on right side.  EXAM: RIGHT RIBS AND CHEST - 3+ VIEW  COMPARISON:  11/23/2012  FINDINGS: No fracture or other bone lesions are seen involving the ribs. There is no evidence of pneumothorax or pleural effusion. Both lungs are clear. Heart size and mediastinal contours are within normal limits.  IMPRESSION: Negative.   Electronically Signed   By: Signa Kell M.D.   On: 12/30/2013 13:28     EKG Interpretation None      MDM   Final diagnoses:  Costochondritis  Medication refill    BP 133/96  Pulse 65  Temp(Src) 98.4 F (36.9 C) (Oral)  Resp 20  Ht  (1.676 m)  Wt 189 lb (85.73 kg)  BMI 30.52 kg/m2  SpO2 98%  LMP 12/09/2013  I have reviewed nursing notes and vital signs. I personally reviewed the imaging tests through PACS system  I reviewed available ER/hospitalization records thought the EMR     Fayrene Helper, [place] 12/30/13 1648

## 2013-12-30 NOTE — ED Notes (Signed)
Pt. Stated, I was playing with my children and i slipped on my right side and i think i hurt my ribs, I've been real sore and hard to move.

## 2013-12-30 NOTE — ED Provider Notes (Signed)
Medical screening examination/treatment/procedure(s) were performed by non-physician practitioner and as supervising physician I was immediately available for consultation/collaboration.   EKG Interpretation None        Laxmi Choung, MD 12/30/13 2317 

## 2013-12-30 NOTE — ED Notes (Signed)
Pt here to be seen for 2 days abd pain, nausea, jittery.  Pt has hypothroidism but does not take any meds d/t insurance.

## 2013-12-30 NOTE — ED Notes (Signed)
Patient reports sharp pains that intermittently come and go in her abdomen. Patient states they are generalized. Patient also reports insomnia for the past two weeks. Patient is unable to fall asleep.

## 2013-12-31 NOTE — Care Management Note (Signed)
    Page 1 of 1   12/30/2013     4:49:09 PM CARE MANAGEMENT NOTE 12/30/2013  Patient:  [name], [name]   Account Number:  [id number]  Date Initiated:  12/30/2013  Documentation initiated by:  Operating [facility]  Subjective/Objective Assessment:     Action/Plan:   MED ASST   Anticipated DC Date:  12/30/2013   Anticipated DC Plan:  HOME/SELF CARE         Choice offered to / List presented to:             Status of service:  Completed, signed off Medicare Important Message given?   (If response is "NO", the following Medicare IM given date fields will be blank) Date Medicare IM given:   Medicare IM given by:   Date Additional Medicare IM given:   Additional Medicare IM given by:    Discharge Disposition:    Per UR Regulation:    If discussed at Long Length of Stay Meetings, dates discussed:    Comments:  12/30/13 16:40 CM met with pt in romm and gave pt [place] letter with list of participating pharmacies.  Pt verbalized understanding of MATCH parameters.  CM gave pt [facility] brochure and pt verbalized understanding to go to the clinic any weekday morning from 9-10 am and ask for AN APPOINTMENT TO GET A PCP; AN APPOINTMENT TO MEET WITH A NAVIGATOR FOR INSURANCE/MEDICAID; AN APPOINTMENT FOR FOLLOW UP CARE.  No other CM needs were communicated.  Mariane Masters, BSN, CM [phone].

## 2014-02-18 ENCOUNTER — Encounter (HOSPITAL_COMMUNITY): Payer: Self-pay | Admitting: Emergency Medicine

## 2014-06-03 ENCOUNTER — Ambulatory Visit: Payer: Medicaid Other | Admitting: Family Medicine

## 2014-06-13 ENCOUNTER — Other Ambulatory Visit: Payer: Self-pay | Admitting: Family Medicine

## 2014-06-13 DIAGNOSIS — O905 Postpartum thyroiditis: Secondary | ICD-10-CM

## 2014-06-13 DIAGNOSIS — I10 Essential (primary) hypertension: Secondary | ICD-10-CM

## 2014-06-13 MED ORDER — LEVOTHYROXINE SODIUM 150 MCG PO TABS: 150.0000 ug | ORAL_TABLET | Freq: Every day | ORAL | Status: DC

## 2014-06-13 MED ORDER — HYDROCHLOROTHIAZIDE 25 MG PO TABS: 25.0000 mg | ORAL_TABLET | Freq: Every day | ORAL | Status: DC

## 2014-06-13 NOTE — Telephone Encounter (Signed)
Needs refill on her thyroid, blood presure and asthma med

## 2014-06-13 NOTE — Telephone Encounter (Signed)
Pt has an appt on 07-08-14 with pcp.  Will send message to him regarding refills. Jazmin Hartsell,CMA

## 2014-06-16 ENCOUNTER — Encounter (HOSPITAL_COMMUNITY): Payer: Self-pay | Admitting: Emergency Medicine

## 2014-06-16 ENCOUNTER — Emergency Department (HOSPITAL_COMMUNITY)
Admission: EM | Admit: 2014-06-16 | Discharge: 2014-06-17 | Disposition: A | Discharge status: Home / Self Care | Payer: Medicaid Other | Attending: Emergency Medicine | Admitting: Emergency Medicine

## 2014-06-16 DIAGNOSIS — R531 Weakness: Secondary | ICD-10-CM

## 2014-06-16 DIAGNOSIS — R52 Pain, unspecified: Secondary | ICD-10-CM | POA: Insufficient documentation

## 2014-06-16 DIAGNOSIS — J45909 Unspecified asthma, uncomplicated: Secondary | ICD-10-CM | POA: Diagnosis not present

## 2014-06-16 DIAGNOSIS — Z87891 Personal history of nicotine dependence: Secondary | ICD-10-CM | POA: Insufficient documentation

## 2014-06-16 DIAGNOSIS — Z862 Personal history of diseases of the blood and blood-forming organs and certain disorders involving the immune mechanism: Secondary | ICD-10-CM | POA: Diagnosis not present

## 2014-06-16 DIAGNOSIS — E039 Hypothyroidism, unspecified: Secondary | ICD-10-CM | POA: Insufficient documentation

## 2014-06-16 DIAGNOSIS — Z8751 Personal history of pre-term labor: Secondary | ICD-10-CM | POA: Diagnosis not present

## 2014-06-16 DIAGNOSIS — Z79899 Other long term (current) drug therapy: Secondary | ICD-10-CM | POA: Diagnosis not present

## 2014-06-16 DIAGNOSIS — M6281 Muscle weakness (generalized): Secondary | ICD-10-CM | POA: Insufficient documentation

## 2014-06-16 LAB — CBC WITH DIFFERENTIAL/PLATELET
Basophils Absolute: 0 10*3/uL (ref 0.0–0.1)
Basophils Relative: 0 % (ref 0–1)
Eosinophils Absolute: 0.1 10*3/uL (ref 0.0–0.7)
Eosinophils Relative: 2 % (ref 0–5)
HCT: 38 % (ref 36.0–46.0)
Hemoglobin: 13 g/dL (ref 12.0–15.0)
LYMPHS ABS: 2.3 10*3/uL (ref 0.7–4.0)
Lymphocytes Relative: 43 % (ref 12–46)
MCH: 31 pg (ref 26.0–34.0)
MCHC: 34.2 g/dL (ref 30.0–36.0)
MCV: 90.5 fL (ref 78.0–100.0)
Monocytes Absolute: 0.4 10*3/uL (ref 0.1–1.0)
Monocytes Relative: 8 % (ref 3–12)
Neutro Abs: 2.5 10*3/uL (ref 1.7–7.7)
Neutrophils Relative %: 47 % (ref 43–77)
PLATELETS: 345 10*3/uL (ref 150–400)
RBC: 4.2 MIL/uL (ref 3.87–5.11)
RDW: 13.4 % (ref 11.5–15.5)
WBC: 5.3 10*3/uL (ref 4.0–10.5)

## 2014-06-16 LAB — BASIC METABOLIC PANEL
Anion gap: 8 (ref 5–15)
BUN: 10 mg/dL (ref 6–23)
CALCIUM: 8.6 mg/dL (ref 8.4–10.5)
CO2: 26 mmol/L (ref 19–32)
CREATININE: 1.16 mg/dL — AB (ref 0.50–1.10)
Chloride: 101 mmol/L (ref 96–112)
GFR calc Af Amer: 74 mL/min — ABNORMAL LOW (ref >90)
GFR, EST NON AFRICAN AMERICAN: 64 mL/min — AB (ref >90)
GLUCOSE: 78 mg/dL (ref 70–99)
POTASSIUM: 3 mmol/L — AB (ref 3.5–5.1)
Sodium: 135 mmol/L (ref 135–145)

## 2014-06-16 NOTE — ED Notes (Signed)
Has a history of thyroid issues and has felt overall weakness the past week. Extremities and back are hurting. Has taken tylenol with minimal relief.

## 2014-06-16 NOTE — ED Notes (Signed)
Has been unable to fill her Thyroid medication for about two weeks. She is awaiting a follow up appointment with Dr. Gayla DossJoyner.

## 2014-06-17 LAB — TSH: TSH: 75.884 u[IU]/mL — AB (ref 0.350–4.500)

## 2014-06-17 MED ORDER — HYDROCODONE-ACETAMINOPHEN 5-325 MG PO TABS
1.0000 | ORAL_TABLET | Freq: Once | ORAL | Status: AC
  Administered 2014-06-17: 1 via ORAL
  Filled 2014-06-17: qty 1

## 2014-06-17 MED ORDER — POTASSIUM CHLORIDE CRYS ER 20 MEQ PO TBCR
40.0000 meq | EXTENDED_RELEASE_TABLET | Freq: Once | ORAL | Status: AC
  Administered 2014-06-17: 40 meq via ORAL
  Filled 2014-06-17: qty 2

## 2014-06-17 MED ORDER — LEVOTHYROXINE SODIUM 50 MCG PO TABS: 150.0000 ug | ORAL_TABLET | Freq: Every day | ORAL | Status: DC

## 2014-06-17 NOTE — Discharge Instructions (Signed)
Return to the emergency room with worsening of symptoms, new symptoms or with symptoms that are concerning, especially fevers, chest pain, palpitations, SOB, numbness, tingling, weakness in face or one area.  Start taking your synthroid. Call to make earlier appointment with Dr. Gayla DossJoyner for follow up with TSH level.  [facility] for [facility] assistance with medications. Read below information and follow recommendations.  Hypothyroidism The thyroid is a large gland located in the lower front of your neck. The thyroid gland helps control metabolism. Metabolism is how your body handles food. It controls metabolism with the hormone thyroxine. When this gland is underactive (hypothyroid), it produces too little hormone.  CAUSES These include:   Absence or destruction of thyroid tissue.  Goiter due to iodine deficiency.  Goiter due to medications.  Congenital defects (since birth).  Problems with the pituitary. This causes a lack of TSH (thyroid stimulating hormone). This hormone tells the thyroid to turn out more hormone. SYMPTOMS  Lethargy (feeling as though you have no energy)  Cold intolerance  Weight gain (in spite of normal food intake)  Dry skin  Coarse hair  Menstrual irregularity (if severe, may lead to infertility)  Slowing of thought processes Cardiac problems are also caused by insufficient amounts of thyroid hormone. Hypothyroidism in the newborn is cretinism, and is an extreme form. It is important that this form be treated adequately and immediately or it will lead rapidly to retarded physical and mental development. DIAGNOSIS  To prove hypothyroidism, your caregiver may do blood tests and ultrasound tests. Sometimes the signs are hidden. It may be necessary for your caregiver to watch this illness with blood tests either before or after diagnosis and treatment. TREATMENT  Low levels of thyroid hormone are increased by using synthetic  thyroid hormone. This is a safe, effective treatment. It usually takes about four weeks to gain the full effects of the medication. After you have the full effect of the medication, it will generally take another four weeks for problems to leave. Your caregiver may start you on low doses. If you have had heart problems the dose may be gradually increased. It is generally not an emergency to get rapidly to normal. HOME CARE INSTRUCTIONS   Take your medications as your caregiver suggests. Let your caregiver know of any medications you are taking or start taking. Your caregiver will help you with dosage schedules.  As your condition improves, your dosage needs may increase. It will be necessary to have continuing blood tests as suggested by your caregiver.  Report all suspected medication side effects to your caregiver. SEEK MEDICAL CARE IF: Seek medical care if you develop:  Sweating.  Tremulousness (tremors).  Anxiety.  Rapid weight loss.  Heat intolerance.  Emotional swings.  Diarrhea.  Weakness. SEEK IMMEDIATE MEDICAL CARE IF:  You develop chest pain, an irregular heart beat (palpitations), or a rapid heart beat. MAKE SURE YOU:   Understand these instructions.  Will watch your condition.  Will get help right away if you are not doing well or get worse. Document Released: 04/05/2005 Document Revised: 06/28/2011 Document Reviewed: 11/24/2007 [facility] Patient Information 2015 [employer] [place]. This information is not intended to replace advice given to you by your health care provider. Make sure you discuss any questions you have with your health care provider.

## 2014-06-17 NOTE — ED Provider Notes (Signed)
CSN: [id number]     Arrival date & time 06/16/14  2213 History   First MD Initiated Contact with Patient 06/16/14 2343     Chief Complaint  Patient presents with  . Weakness  . Extremity Pain  . Back Pain     (Consider location/radiation/quality/duration/timing/severity/associated sxs/prior Treatment) HPI  [name] is a 29 y.o. female with PMH of hypothyroidism presenting with generalized weakness as well as extreme back pain for the past week. Patient denies any numbness, tingling. She denies any injury. She has taken Tylenol with minimal relief. Patient with history of hypothyroidism on Synthroid but has not taken her medication for 2 weeks. Patient's primary care providers Dr. Gayla DossJoyner and she has an appointment in one month. Patient has been working with the match program for financial assistance getting medications she reports difficulty. Patient frustrated with her situation. No fevers, chills. [name] reports some nausea but no vomiting diarrhea constipation. She does report feeling hot and cold. No chest pain, SOB, palpitations.   Past Medical History  Diagnosis Date  . Anemia     all 3 pregnancies  . Hyperemesis     3rd pregnancy  . Gestational hypertension 2010    with 3rd pregnancy  . Preterm labor     with 3rd pregnancy  . Asthma     albuterol inhaler, last used one year ago  . Thyroid disease     had radioactive iodine  . Grave's disease    Past Surgical History  Procedure Laterality Date  . Dilation and curettage of uterus  2010  . Laparoscopic tubal ligation  08/10/2011    Procedure: LAPAROSCOPIC TUBAL LIGATION;  Surgeon: Tereso NewcomerUgonna A Anyanwu, MD;  Location: [facility];  Service: Gynecology;  Laterality: Bilateral;   Family History  Problem Relation Age of Onset  . Hypertension Father   . Hypertension Mother   . Anemia Mother   . Bipolar disorder Mother   . Anesthesia problems Neg Hx   . Hypotension Neg Hx   . Malignant hyperthermia Neg Hx   . Pseudochol  deficiency Neg Hx   . Thyroid disease Maternal Aunt   . Bipolar disorder Maternal Aunt   . Diabetes Maternal Uncle   . Diabetes Maternal Grandmother   . Diabetes Paternal Grandmother   . Cancer Paternal Grandmother    History  Substance Use Topics  . Smoking status: Former Smoker -- 0.25 packs/day for .5 years    Types: Cigarettes    Quit date: 11/18/2011  . Smokeless tobacco: Never Used  . Alcohol Use: No   OB History    Gravida Para Term Preterm AB TAB SAB Ectopic Multiple Living   5 4 3 1 1  1   4      Review of Systems 10 Systems reviewed and are negative for acute change except as noted in the HPI.    Allergies  Shellfish allergy  Home Medications   Prior to Admission medications   Medication Sig Start Date End Date Taking? Authorizing Provider  acetaminophen (TYLENOL) 500 MG tablet Take 1,000 mg by mouth every 8 (eight) hours as needed for headache.   Yes Historical Provider, MD  hydrochlorothiazide (HYDRODIURIL) 25 MG tablet Take 1 tablet (25 mg total) by mouth daily. 06/13/14  Yes Jamal CollinJames R Joyner, MD  levothyroxine (SYNTHROID, LEVOTHROID) 150 MCG tablet Take 1 tablet (150 mcg total) by mouth daily before breakfast. 06/13/14  Yes Jamal CollinJames R Joyner, MD  ibuprofen (ADVIL,MOTRIN) 800 MG tablet Take 1 tablet (800 mg total) by  mouth 3 (three) times daily. Patient not taking: Reported on 06/17/2014 12/30/13   Fayrene Helper, PA-C  levothyroxine (SYNTHROID, LEVOTHROID) 50 MCG tablet Take 3 tablets (150 mcg total) by mouth daily before breakfast. 06/17/14   Louann Sjogren, PA-C   BP 138/101 mmHg  Pulse 69  Temp(Src) 98.3 F (36.8 C) (Oral)  Resp 18  Ht  (1.676 m)  SpO2 99%  LMP 06/07/2014 (Exact Date) Physical Exam  Constitutional: She appears well-developed and well-nourished. No distress.  HENT:  Head: Normocephalic and atraumatic.  Mouth/Throat: Oropharynx is clear and moist.  No edema of face or extremities. No proptosis  Eyes: Conjunctivae and EOM are normal.  Pupils are equal, round, and reactive to light. Right eye exhibits no discharge. Left eye exhibits no discharge.  Cardiovascular: Normal rate and regular rhythm.   Pulmonary/Chest: Effort normal and breath sounds normal. No respiratory distress. She has no wheezes.  Abdominal: Soft. Bowel sounds are normal. She exhibits no distension. There is no tenderness.  Musculoskeletal:  No midline spine tenderness. No low back tenderness. No focal extremity tenderness.  Neurological: She is alert. She has normal reflexes. No cranial nerve deficit. Coordination normal.  Speech is clear and goal oriented. Strength 5/5 in upper and lower extremities. Sensation intact. Intact rapid alternating movements, finger to nose, and heel to shin. Negative Romberg. No pronator drift. Normal gait.   Skin: Skin is warm and dry. She is not diaphoretic.  Nursing note and vitals reviewed.   ED Course  Procedures (including critical care time) Labs Review Labs Reviewed  BASIC METABOLIC PANEL - Abnormal; Notable for the following:    Potassium 3.0 (*)    Creatinine, Ser 1.16 (*)    GFR calc non Af Amer 64 (*)    GFR calc Af Amer 74 (*)    All other components within normal limits  CBC WITH DIFFERENTIAL/PLATELET  TSH    Imaging Review No results found.   EKG Interpretation None      MDM   Final diagnoses:  Generalized weakness  Hypothyroidism, unspecified hypothyroidism type  Generalized pain   Patient presenting with generalized weakness and generalized pain. VSS. No neurological deficits. Patient also with back pain but no red flags and no deficits. Patient pain managed in ED. She is not taking her Synthroid and discussed the importance of taking this regularly. Encouraged her to follow up with [facility] program for financial assistance with medications. Her lab workup is remarkable for elevated creatinine and hypokalemia. Supplementation provided in ED and pt to follow up with PCP for recheck. TSH ordered  and pending. I doubt acute thyroid storm or myxedema. Pt without hypotension, hypothermia, AMS, edematous extremities or face. Script for synthroid provided. And stressed the importance of medication compliance and follow up with PCP.  Discussed return precautions with patient. Discussed all results and patient verbalizes understanding and agrees with plan.   Louann Sjogren, PA-C 06/17/14 0100  Dione Booze, MD 06/17/14 [phone]

## 2014-06-19 ENCOUNTER — Telehealth: Payer: Self-pay | Admitting: Family Medicine

## 2014-06-19 NOTE — Telephone Encounter (Signed)
Pt called because [facility] Her Synthroid is costing her full price and she can not afford that since she only has Family planning medicaid. Can we call her pharmacy and see if we need to call this in a certain way for her to qualify for the discount. jw

## 2014-06-20 NOTE — Telephone Encounter (Signed)
Pt called back and no longer needs the to speak to the doctor. jw

## 2014-06-28 ENCOUNTER — Ambulatory Visit (INDEPENDENT_AMBULATORY_CARE_PROVIDER_SITE_OTHER): Payer: Medicaid Other | Admitting: Family Medicine

## 2014-06-28 ENCOUNTER — Encounter: Payer: Self-pay | Admitting: Family Medicine

## 2014-06-28 VITALS — BP 142/100 | HR 72 | Temp 98.4°F | Ht 66.0 in | Wt 231.0 lb

## 2014-06-28 DIAGNOSIS — O99285 Endocrine, nutritional and metabolic diseases complicating the puerperium: Secondary | ICD-10-CM

## 2014-06-28 DIAGNOSIS — N179 Acute kidney failure, unspecified: Secondary | ICD-10-CM

## 2014-06-28 DIAGNOSIS — I10 Essential (primary) hypertension: Secondary | ICD-10-CM

## 2014-06-28 DIAGNOSIS — E079 Disorder of thyroid, unspecified: Secondary | ICD-10-CM

## 2014-06-28 DIAGNOSIS — O905 Postpartum thyroiditis: Secondary | ICD-10-CM

## 2014-06-28 MED ORDER — SYNTHROID 150 MCG PO TABS: 150.0000 ug | ORAL_TABLET | Freq: Every day | ORAL | Status: DC

## 2014-06-28 MED ORDER — HYDROCHLOROTHIAZIDE 25 MG PO TABS: 25.0000 mg | ORAL_TABLET | Freq: Every day | ORAL | Status: DC

## 2014-06-28 NOTE — Assessment & Plan Note (Signed)
Never had TSH in normal range since this developed in 2012. TSH all >50, most recent 75. Has now restarted 150mcg SYNTHROID daily (has medicaid so brand will be covered, this is what she brings in today from ED prescription). Pt still having symptoms but discussed will take several more weeks before she may start feeling better. Counseled regarding importance of taking synthroid daily, reasons to take without food. F/u repeat TSH 6 weeks from ED draw, so early April, I placed future order so pt has option to get this drawn before PCP/doctor visit in 6 weeks. Refill sent for 150mcg synthroid.

## 2014-06-28 NOTE — Progress Notes (Signed)
   Subjective:    Patient ID: [name], female    DOB: [birth year], 29 y.o.   MRN: [id number]  HPI  CC: ED follow up  # Hypothyroidism:  Micah FlesherWent to ED end of February for diffuse body aches and pains. Found to have significantly elevated TSH, no evidence of myxedema coma  Has been off synthroid since August. Chart review shows TSH never in normal range since developing postpartum hypothyroidism in 2013.  She continues to have body aches, muscle aches. She was able to pick up her thyroid prescription and started taking it: 150mcg SYNTHROID (now has Medicaid so should be covered) ROS: +weight gain, +myalgias, +fatigue, no CP, no SOB.  # Hypertension  Has not been taking her HCTZ  # AKI  Told by ED her kidney function was off  Not having any difficulty with urination, no hematuria  Thinks she may have been dehydrated at the time  Review of Systems   See HPI for ROS. All other systems reviewed and are negative.  Past medical history, surgical, family, and social history reviewed and updated in the EMR as appropriate. Objective:  BP 142/100 mmHg  Pulse 72  Temp(Src) 98.4 F (36.9 C) (Oral)  Ht 5\' 6"  (1.676 m)  Wt 231 lb (104.781 kg)  BMI 37.30 kg/m2  LMP 06/07/2014 (Exact Date) Vitals and nursing note reviewed  General: NAD HEENT: mildly enlarged thyroid bilaterally, no nodules palpated CV: RRR, normal s1s2 no mrg Resp: CTAB, normal effort Neuro: 3+ patellar reflexes bilaterally, 2+ biceps bilaterally  Assessment & Plan:  See Problem List Documentation

## 2014-06-28 NOTE — Patient Instructions (Signed)
Your symptoms are most likely entirely explained by uncontrolled hypothyroidism.  It is very important to take the synthroid every day, at least 30 minutes before eating (take on an empty stomach otherwise it is not absorbed correctly when taken with food).  Follow up in 6 weeks with a repeat TSH level, you can do a LAB ONLY visit 2-3 days before the visit with the doctor so they have the result there during visit.

## 2014-06-28 NOTE — Assessment & Plan Note (Signed)
Mild Cr bump to 1.16 in ED, pt thinks she was dehydrated at the time. Will get repeat bmet with follow up TSH.

## 2014-06-28 NOTE — Assessment & Plan Note (Signed)
Out of medicine. Refill sent. Reviewed low potassium, encouraged to increase potassium in diet with green leafy vegetables, will repeat Bmet in 6 weeks with thyroid testing.

## 2014-07-02 ENCOUNTER — Telehealth: Payer: Self-pay | Admitting: Family Medicine

## 2014-07-02 ENCOUNTER — Ambulatory Visit (INDEPENDENT_AMBULATORY_CARE_PROVIDER_SITE_OTHER): Payer: Medicaid Other | Admitting: Family Medicine

## 2014-07-02 ENCOUNTER — Encounter: Payer: Self-pay | Admitting: Family Medicine

## 2014-07-02 VITALS — BP 143/94 | HR 86 | Temp 97.9°F

## 2014-07-02 DIAGNOSIS — E038 Other specified hypothyroidism: Secondary | ICD-10-CM

## 2014-07-02 DIAGNOSIS — G737 Myopathy in diseases classified elsewhere: Secondary | ICD-10-CM

## 2014-07-02 DIAGNOSIS — E039 Hypothyroidism, unspecified: Secondary | ICD-10-CM

## 2014-07-02 DIAGNOSIS — M791 Myalgia, unspecified site: Secondary | ICD-10-CM

## 2014-07-02 LAB — BASIC METABOLIC PANEL
BUN: 14 mg/dL (ref 6–23)
CO2: 27 mEq/L (ref 19–32)
Calcium: 10.2 mg/dL (ref 8.4–10.5)
Chloride: 100 mEq/L (ref 96–112)
Creat: 1 mg/dL (ref 0.50–1.10)
Glucose, Bld: 81 mg/dL (ref 70–99)
POTASSIUM: 4.1 meq/L (ref 3.5–5.3)
Sodium: 136 mEq/L (ref 135–145)

## 2014-07-02 LAB — TSH: TSH: 31.22 u[IU]/mL — ABNORMAL HIGH (ref 0.350–4.500)

## 2014-07-02 MED ORDER — TRAMADOL HCL 50 MG PO TABS: 50.0000 mg | ORAL_TABLET | Freq: Three times a day (TID) | ORAL | Status: DC | PRN

## 2014-07-02 MED ORDER — CYCLOBENZAPRINE HCL 5 MG PO TABS: 5.0000 mg | ORAL_TABLET | Freq: Three times a day (TID) | ORAL | Status: DC | PRN

## 2014-07-02 NOTE — Progress Notes (Signed)
I was preceptor the day of this visit.   

## 2014-07-02 NOTE — Telephone Encounter (Signed)
Will forward to PCP for advice. Terren Jandreau, CMA. 

## 2014-07-02 NOTE — Patient Instructions (Signed)

## 2014-07-02 NOTE — Telephone Encounter (Signed)
Returned phone call left VM. Stated would be sending in rx for muscle relaxer. However upon chart review she did had a clinic visit today with Dr. Lum BabeEniola and was given rx for flexeril and tramadol, so I will NOT be sending in this Rx since I was going to send flexeril. -Dr. Waynetta SandyWight

## 2014-07-02 NOTE — Progress Notes (Addendum)
Subjective:     Patient ID: Carrie RussellAlesia P Hecker, female   DOB: 06/17/1985, 29 y.o.   MRN: [id number]  HPI Muscle pain/Hypothyrodism: C/O pain for 2 wks about 10/10 in severity,she feels it is associated with her thyroid. The pain is aching in nature, denies injury or trauma, uses Tylenol extra strength and massaging as well as Ibuprofen 800 mg with no improvement. Last time she had similar symptoms was in 2013 and she was admitted to the hospital for this. Pain is located on her arms, legs and back. It is associated with cramping.Pain is worse with movement and cold air makes it worse. She denies any bone pain.  Current Outpatient Prescriptions on File Prior to Visit  Medication Sig Dispense Refill  . acetaminophen (TYLENOL) 500 MG tablet Take 1,000 mg by mouth every 8 (eight) hours as needed for headache.    . hydrochlorothiazide (HYDRODIURIL) 25 MG tablet Take 1 tablet (25 mg total) by mouth daily. 30 tablet 0  . SYNTHROID 150 MCG tablet Take 1 tablet (150 mcg total) by mouth daily before breakfast. 30 tablet 1  . ibuprofen (ADVIL,MOTRIN) 800 MG tablet Take 1 tablet (800 mg total) by mouth 3 (three) times daily. (Patient not taking: Reported on 06/17/2014) 21 tablet 0   No current facility-administered medications on file prior to visit.   Past Medical History  Diagnosis Date  . Anemia     all 3 pregnancies  . Hyperemesis     3rd pregnancy  . Gestational hypertension 2010    with 3rd pregnancy  . Preterm labor     with 3rd pregnancy  . Asthma     albuterol inhaler, last used one year ago  . Thyroid disease     had radioactive iodine  . Grave's disease      Review of Systems  Respiratory: Negative.   Cardiovascular: Negative.   Gastrointestinal: Negative.   Musculoskeletal: Positive for myalgias. Negative for joint swelling.  All other systems reviewed and are negative.      Filed Vitals:   07/02/14 1019  BP: 143/94  Pulse: 86  Temp: 97.9 F (36.6 C)    Objective:   Physical Exam  Constitutional: She appears well-developed. No distress.  Eyes:    Cardiovascular: Normal rate, regular rhythm, normal heart sounds and intact distal pulses.   No murmur heard. Pulmonary/Chest: Effort normal and breath sounds normal. No respiratory distress. She has no wheezes.  Abdominal: Bowel sounds are normal. She exhibits no distension.  Musculoskeletal: Normal range of motion. She exhibits no edema.  No extremity swelling or deformity.  Nursing note and vitals reviewed.      Assessment:     Myalgia  Hypothyroidism    Plan:     Check problem list.

## 2014-07-02 NOTE — Telephone Encounter (Signed)
Patient is requesting medication for the cramping she is having, says it is due to her thyroid problem, was seen for this problem on 06/28/14, pt was not given anything at that visit.

## 2014-07-03 ENCOUNTER — Telehealth: Payer: Self-pay | Admitting: Family Medicine

## 2014-07-03 DIAGNOSIS — E039 Hypothyroidism, unspecified: Secondary | ICD-10-CM | POA: Insufficient documentation

## 2014-07-03 DIAGNOSIS — M791 Myalgia, unspecified site: Secondary | ICD-10-CM | POA: Insufficient documentation

## 2014-07-03 MED ORDER — LEVOTHYROXINE SODIUM 175 MCG PO TABS
175.0000 ug | ORAL_TABLET | Freq: Every day | ORAL | Status: DC
Start: 2014-07-03 — End: 2014-09-16

## 2014-07-03 NOTE — Assessment & Plan Note (Addendum)
Etiology unclear. Query hypothyroid myopathy. Differentials includes dehydration since she is on thiazide, related to hypothyroidism,or myositis. Bmet checked to assess for electrolyte imbalance, CK MYO checked for myositis to assess for hypothyroid myopathy. Patient advised adequate hydration. Tramadol and flexeril prescribed for few days. F/U with PCP soon for reassessment.

## 2014-07-03 NOTE — Telephone Encounter (Addendum)
I discussed her TSH result with her, although it improved from the last one checked while she was in the hospital, it is still relatively elevated despite compliace. Plan to increase synthroid to daily. Prescription sent to the pharmacy. Patient is aware.  I also discussed him CK-Myo result with her which was slightly elevated 6.1% normal should be less than 6% this is likely related to myositis. No cardiac symptoms currently when I spoke with her, she stated she does have tightness over her chest relating to her muscle pain,denies chest pain. Return precaution discussed.I specifically instructed her to go to the ED if having any chest pain. She verbalized understanding.

## 2014-07-03 NOTE — Assessment & Plan Note (Signed)
She has been compliant with Synthroid . She has been on same dose for more than two years with no control of her disease. TSH rechecked today. If still elevated I will increase her synthroid.

## 2014-07-04 ENCOUNTER — Telehealth: Payer: Self-pay | Admitting: Family Medicine

## 2014-07-04 DIAGNOSIS — E039 Hypothyroidism, unspecified: Secondary | ICD-10-CM | POA: Insufficient documentation

## 2014-07-04 DIAGNOSIS — G737 Myopathy in diseases classified elsewhere: Secondary | ICD-10-CM

## 2014-07-04 LAB — CK TOTAL AND CKMB (NOT AT ARMC)
CK TOTAL: 653 U/L — AB (ref 7–177)
CK, MB: 6.1 ng/mL — AB (ref 0.0–5.0)
RELATIVE INDEX: 0.9 (ref 0.0–4.0)

## 2014-07-04 NOTE — Assessment & Plan Note (Addendum)
+   myalgia(muscle cramps,spasm and stiffness) with no muscle weakness. Last CK 2 wks ago was >70 done at the ED. No dose adjustment was made to her medication. Recheck TSH today if still elevated will increase synthroid to 175 mcg. Check CK, myo today as well. I encouraged patient to hydrate herself and rest at home. F/U with PCP in 1 week or sooner if symptoms worsens. She agreed with plan.

## 2014-07-04 NOTE — Addendum Note (Signed)
Addended by: Janit PaganENIOLA, Santrice Muzio T on: 07/04/2014 07:26 AM   Modules accepted: Level of Service

## 2014-07-04 NOTE — Telephone Encounter (Signed)
I called to discuss total CK report with patient (653) upper limit of normal is 177. 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  Patient is feeling better with muscle pain.  I encourage liberal hydration.  Also she has f/u with PCP next week Tuesday. She is instructed to have PCP recheck her muscle enzyme to make sure it is coming down fine. Return precaution discussed.  This message has been routed to PCP who is seeing patient next Tuesday.

## 2014-07-08 ENCOUNTER — Encounter: Payer: Self-pay | Admitting: Family Medicine

## 2014-07-08 ENCOUNTER — Other Ambulatory Visit (HOSPITAL_COMMUNITY)
Admission: RE | Admit: 2014-07-08 | Discharge: 2014-07-08 | Disposition: A | Discharge status: Home / Self Care | Payer: Medicaid Other | Source: Ambulatory Visit | Attending: Family Medicine | Admitting: Family Medicine

## 2014-07-08 ENCOUNTER — Ambulatory Visit (INDEPENDENT_AMBULATORY_CARE_PROVIDER_SITE_OTHER): Payer: Medicaid Other | Admitting: Family Medicine

## 2014-07-08 VITALS — BP 124/88 | HR 85 | Temp 98.5°F | Ht 66.0 in | Wt 224.0 lb

## 2014-07-08 DIAGNOSIS — J302 Other seasonal allergic rhinitis: Secondary | ICD-10-CM | POA: Insufficient documentation

## 2014-07-08 DIAGNOSIS — Z1151 Encounter for screening for human papillomavirus (HPV): Secondary | ICD-10-CM | POA: Diagnosis present

## 2014-07-08 DIAGNOSIS — Z01419 Encounter for gynecological examination (general) (routine) without abnormal findings: Secondary | ICD-10-CM | POA: Diagnosis not present

## 2014-07-08 DIAGNOSIS — Z124 Encounter for screening for malignant neoplasm of cervix: Secondary | ICD-10-CM

## 2014-07-08 MED ORDER — CETIRIZINE HCL 10 MG PO TABS: 10.0000 mg | ORAL_TABLET | Freq: Every day | ORAL | Status: DC

## 2014-07-08 NOTE — Progress Notes (Signed)
  Patient name: [name] MRN [id number]  Date of birth: 07-20-1985  CC & HPI:  [name] is a 29 y.o. female presenting today for pap smear. She denies hx of abnormal pap. Had Essure placed previously but never checked since.  Social History: Reviewed:   Objective Findings:  Vitals: BP 124/88 mmHg  Pulse 85  Temp(Src) 98.5 F (36.9 C) (Oral)  Ht 5\' 6"  (1.676 m)  Wt 224 lb (101.606 kg)  BMI 36.17 kg/m2  LMP 06/11/2014 (Exact Date)  Gen: NAD CV: RRR w/o m/r/g, pulses +2 b/l Resp: CTAB w/ normal respiratory effort Speculum Exam: Ext genitalia: wnl; Vaginal discharge: wnl; Cervix: wnl Bimanual Exam: No Cervical motion tenderness; No Vaginal wall defects; Adnexa nontender   Assessment & Plan:   Please See Problem Focused Assessment & Plan

## 2014-07-09 NOTE — Progress Notes (Signed)
I was preceptor the day of this visit.   

## 2014-07-10 LAB — CYTOLOGY - PAP

## 2014-08-01 ENCOUNTER — Other Ambulatory Visit: Payer: Medicaid Other

## 2014-08-03 ENCOUNTER — Other Ambulatory Visit: Payer: Self-pay | Admitting: Family Medicine

## 2014-08-06 ENCOUNTER — Other Ambulatory Visit: Payer: Self-pay | Admitting: *Deleted

## 2014-08-06 DIAGNOSIS — I10 Essential (primary) hypertension: Secondary | ICD-10-CM

## 2014-08-06 MED ORDER — HYDROCHLOROTHIAZIDE 25 MG PO TABS: 25.0000 mg | ORAL_TABLET | Freq: Every day | ORAL | Status: DC

## 2014-08-07 ENCOUNTER — Other Ambulatory Visit: Payer: Medicaid Other

## 2014-08-07 ENCOUNTER — Ambulatory Visit: Payer: Medicaid Other | Admitting: Family Medicine

## 2014-08-07 DIAGNOSIS — I10 Essential (primary) hypertension: Secondary | ICD-10-CM

## 2014-08-07 DIAGNOSIS — E079 Disorder of thyroid, unspecified: Secondary | ICD-10-CM

## 2014-08-07 DIAGNOSIS — O99285 Endocrine, nutritional and metabolic diseases complicating the puerperium: Principal | ICD-10-CM

## 2014-08-07 LAB — BASIC METABOLIC PANEL
BUN: 9 mg/dL (ref 6–23)
CHLORIDE: 106 meq/L (ref 96–112)
CO2: 25 mEq/L (ref 19–32)
Calcium: 8.9 mg/dL (ref 8.4–10.5)
Creat: 0.73 mg/dL (ref 0.50–1.10)
GLUCOSE: 84 mg/dL (ref 70–99)
Potassium: 3.9 mEq/L (ref 3.5–5.3)
Sodium: 139 mEq/L (ref 135–145)

## 2014-08-07 LAB — TSH: TSH: 3.139 u[IU]/mL (ref 0.350–4.500)

## 2014-08-07 NOTE — Progress Notes (Signed)
[facility] phlebotomist drew:  BMP, TSH

## 2014-08-08 ENCOUNTER — Telehealth: Payer: Self-pay | Admitting: Family Medicine

## 2014-08-08 NOTE — Telephone Encounter (Signed)
Left voicemail. Called to inform patient of normal thyroid testing. When patient returns call please let her know her recent TSH/thyroid test was normal and that she is on the correct dose of synthroid, she should continue this unless she develops additional symptoms. -Dr. Waynetta SandyWight

## 2014-08-14 ENCOUNTER — Encounter: Payer: Self-pay | Admitting: Family Medicine

## 2014-08-14 ENCOUNTER — Encounter: Payer: Self-pay | Admitting: Clinical

## 2014-08-14 ENCOUNTER — Ambulatory Visit (INDEPENDENT_AMBULATORY_CARE_PROVIDER_SITE_OTHER): Payer: Medicaid Other | Admitting: Family Medicine

## 2014-08-14 VITALS — BP 124/90 | HR 74 | Temp 98.6°F | Ht 66.0 in | Wt 225.0 lb

## 2014-08-14 DIAGNOSIS — F32A Depression, unspecified: Secondary | ICD-10-CM

## 2014-08-14 DIAGNOSIS — F329 Major depressive disorder, single episode, unspecified: Secondary | ICD-10-CM

## 2014-08-14 DIAGNOSIS — I10 Essential (primary) hypertension: Secondary | ICD-10-CM | POA: Diagnosis present

## 2014-08-14 DIAGNOSIS — F900 Attention-deficit hyperactivity disorder, predominantly inattentive type: Secondary | ICD-10-CM | POA: Diagnosis not present

## 2014-08-14 DIAGNOSIS — E039 Hypothyroidism, unspecified: Secondary | ICD-10-CM | POA: Diagnosis not present

## 2014-08-14 DIAGNOSIS — F908 Attention-deficit hyperactivity disorder, other type: Secondary | ICD-10-CM

## 2014-08-14 MED ORDER — SERTRALINE HCL 50 MG PO TABS: 50.0000 mg | ORAL_TABLET | Freq: Every day | ORAL | Status: DC

## 2014-08-14 NOTE — Progress Notes (Signed)
CSW received a referral to provide pt with emotional support and resources for counseling/employment. CSW met with pt who became very tearful stating that she has always felt depressed but more recently has felt worse due to homelessness, unemployment and feeling as if everything is going wrong. CSW provided active listening as pt expressed how mental illness runs in her family and that she is seeing symptoms in her children. Pt stated that she would be agreeable to seeing a therapist and would even considering her oldest daughter receiving counseling as well. Pt was given a list of resources for counseling, shelters, employment and information regarding applying for a childcare voucher from [employer]. Pt appreciative of CSW support and agreeable to call CSW next week with updates.  Hunt Oris, MSW, [place]

## 2014-08-14 NOTE — Patient Instructions (Addendum)
We will start the Zoloft to help you with your mood. 50mg  once a day. We can go up on this if needed. Follow up in about 6 weeks.  Theresia BoughNorma Wilson, our clinic social worker, saw you today. Take a look at the resources and if you have any other questions we are always available.

## 2014-08-14 NOTE — Progress Notes (Signed)
   Subjective:    Patient ID: Carrie RussellAlesia P [name], female    DOB: 1985/11/24, 29 y.o.   MRN: [id number]  HPI  CC: lab review  # Depression:  Having multiple stressors at home: homelessness, unemployed  Has a family history of depression and other mental illness  Has been on seroquel in the past but she is not sure about any other medications  Denies SI or HI  Would like to see a therapist ROS: No SI/HI, no hallucinations  # Hypertension  Not taking HCTZ, has been out of medicine (refill sent 4/19 but didn't receive call from pharmacy) ROS: no CP, no SOB, no HA  # ?ADHD  Had this as a child, was on medications  Feels this is a strong issue for her with employment, she gets bored very easily at jobs  Review of Systems   See HPI for ROS. All other systems reviewed and are negative.  Past medical history, surgical, family, and social history reviewed and updated in the EMR as appropriate. Objective:  BP 148/99 mmHg  Pulse 74  Temp(Src) 98.6 F (37 C) (Oral)  Ht 5\' 6"  (1.676 m)  Wt 225 lb (102.059 kg)  BMI 36.33 kg/m2  LMP 08/10/2014 (Exact Date) Vitals and nursing note reviewed  General: NAD CV: RRR, nl s1s2 no mrg. 2+ radial pulses bl Resp: CTAB nl effort Ext: no edema or cyanosis Neuro: alert and oriented, no focal deficits Psych: mood depressed, affect congruent with teary eyes during visit.  Assessment & Plan:  See Problem List Documentation

## 2014-08-15 ENCOUNTER — Telehealth: Payer: Self-pay | Admitting: Clinical

## 2014-08-15 NOTE — Telephone Encounter (Signed)
CSW received a call from pt updating CSW with contacts that she has made. Pt states she has called several family shelter however they all have a waiting list. CSW encouraged pt to still get on every waiting list as she could move up rather quickly. CSW also informed pt to see if CSW writing a letter would make a difference. Pt agreeable to contact the shelters back. CSW has also emailed pt a link to affordable apartment housing. Pt agreeable to contact those apartments as well. Pt appears to be very proactive and is appreciative of all support given.   CSW to remain following for ongoing support.  Theresia BoughNorma Aayat Hajjar, MSW, LCSW [phone]

## 2014-08-18 DIAGNOSIS — F32A Depression, unspecified: Secondary | ICD-10-CM | POA: Insufficient documentation

## 2014-08-18 DIAGNOSIS — F329 Major depressive disorder, single episode, unspecified: Secondary | ICD-10-CM | POA: Insufficient documentation

## 2014-08-18 DIAGNOSIS — F908 Attention-deficit hyperactivity disorder, other type: Secondary | ICD-10-CM | POA: Insufficient documentation

## 2014-08-18 NOTE — Assessment & Plan Note (Signed)
Ongoing issue, has particularly tough social stressors. Seen by Nelva BushNorma today and given information regarding seeking therapist. Pt elects to start sertraline today. F/u 6 weeks.

## 2014-08-18 NOTE — Assessment & Plan Note (Signed)
Discussed normal TSH results, continue current dose. F/u 6 months.

## 2014-08-18 NOTE — Assessment & Plan Note (Signed)
Unable to fully elicit today in clinic. Will need to follow up with further evaluation. Symptoms may be due to concurrent depression. F/u 6 weeks.

## 2014-08-18 NOTE — Assessment & Plan Note (Signed)
Out of medicine but refill was already sent. Repeat BP in clinic improved/at goal. Counseled to pick up medicine and take daily. F/u 6 months.

## 2014-08-19 NOTE — Progress Notes (Signed)
I was preceptor the day of this visit.   

## 2014-09-16 ENCOUNTER — Other Ambulatory Visit: Payer: Self-pay | Admitting: Family Medicine

## 2014-11-28 ENCOUNTER — Ambulatory Visit: Payer: Medicaid Other | Admitting: Family Medicine

## 2014-12-04 ENCOUNTER — Telehealth: Payer: Self-pay | Admitting: Family Medicine

## 2014-12-04 NOTE — Telephone Encounter (Signed)
Patient want a call back.  Won't give reason want to speak with SW.

## 2014-12-04 NOTE — Telephone Encounter (Signed)
CSW returned pts call. 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 Pt also wanting a referral to an endocrinologist. Pt also stated that she would like to be switched to a female provider. CSW informed pt that CSW would inform [facility] Asst. Director of pts request for a female PCP. CSW encouraged pt to discuss her concerns and referral request with the provider at her appointment on 8/29. Pt appreciative and agreeable.  Theresia Bough, MSW, LCSW [phone]

## 2014-12-16 ENCOUNTER — Ambulatory Visit: Payer: Medicaid Other | Admitting: Family Medicine

## 2015-01-15 ENCOUNTER — Telehealth: Payer: Self-pay | Admitting: Internal Medicine

## 2015-01-15 NOTE — Telephone Encounter (Signed)
Patient wanted to speak with you today.

## 2015-01-16 NOTE — Telephone Encounter (Signed)
CSW contacted pt and explored what needs she had. Pt states she is applying for disability and is frustrated with the process. Pt states she is setup with [employer] and has been diagnosed with Bipolar 1 however feels that her medical [employer] should be able to send paperwork confirming she has a medical disability as well. CSW informed pt that the disability process is very lengthy and that if she has been seen at other speciality clinics for a medical disability that they would be a good resource to contact for medical records/letter. CSW reminded pt that she no showed to her last doctors appointment and that she has not met her new Amar Keenum. Pt stated she was unaware that she was switched to a female Prentiss Polio as previously requested. CSW reminded pt that CSW had contacted her in August to let her know about the switch, pt agreed.   Pt states she has a big birth mark on her leg that has been causing her pain and would like her doctor to look at it and possibly refer her to a specialist to have it looked at. Pt confirms she no longer has Medicaid and is needing to apply for the orange card. Pt also states she found out her Medicaid is no longer valid when she went to the dentist yesterday for a cleaning and to fix her chipped tooth.  CSW encouraged pt to contact our clinic to schedule an appointment with her doctor. CSW also emailed pt information on the free dental clinic on 10/1 as well as who to contact to schedule an appointment to apply for the orange card. Pt appreciative.  Hunt Oris, MSW, [place]

## 2015-01-24 ENCOUNTER — Telehealth: Payer: Self-pay | Admitting: Internal Medicine

## 2015-01-24 NOTE — Telephone Encounter (Signed)
Will forward to PCP for review. Lianny Molter, CMA. 

## 2015-01-24 NOTE — Telephone Encounter (Signed)
Spoke with [name] today regarding this. States we will be able to help patient with her medications. Will forward information to her.

## 2015-01-24 NOTE — Telephone Encounter (Signed)
Pt called because she no longer has Medicaid and doesn't know what she is suppose to do about getting her Medication.She takes Synthroid and can not get any since she has no money. Shw wanted to know do we have medication her we can give to her? Please call patient to discuss her options. jw

## 2015-02-03 NOTE — Telephone Encounter (Signed)
Returned call to patient.  She no longer has Medicaid and needs assistance with paying for Synthroid and HCTZ.  Uses [facility] ([address]) and [facility] ([address]  Spoke with both pharmacies--[facility]both meds $[facility] each.  Patient informed and will pick up $8 from indigent fund at front desk.  Altamese Dilling~Blayre Papania, BSN, RN-BC'

## 2015-02-11 NOTE — Telephone Encounter (Signed)
Pt called to report that she called [facility] ([address] yesterday and they informed her that her Rx for the Synthroid was not there. I informed her, as per message below, [facility] Pt expressed understanding. Sadie Reynolds, ASA

## 2015-03-11 ENCOUNTER — Other Ambulatory Visit: Payer: Self-pay | Admitting: Internal Medicine

## 2015-03-11 MED ORDER — LEVOTHYROXINE SODIUM 175 MCG PO TABS: ORAL_TABLET | ORAL | Status: DC

## 2015-03-11 NOTE — Telephone Encounter (Signed)
Please let Ms. Levada SchillingSummers know that I have refilled her Synthroid. Thank you!

## 2015-03-11 NOTE — Telephone Encounter (Signed)
Refill request for Synthroid. Patient still w/o insurance and cannot afford to come in for an office visit but needs her meds.

## 2015-04-30 ENCOUNTER — Ambulatory Visit: Payer: Medicaid Other

## 2015-05-02 ENCOUNTER — Encounter (HOSPITAL_COMMUNITY): Payer: Self-pay | Admitting: *Deleted

## 2015-05-02 ENCOUNTER — Emergency Department (HOSPITAL_COMMUNITY)
Admission: EM | Admit: 2015-05-02 | Discharge: 2015-05-02 | Disposition: A | Discharge status: Home / Self Care | Payer: Medicaid Other | Attending: Emergency Medicine | Admitting: Emergency Medicine

## 2015-05-02 DIAGNOSIS — Z8751 Personal history of pre-term labor: Secondary | ICD-10-CM | POA: Insufficient documentation

## 2015-05-02 DIAGNOSIS — J45909 Unspecified asthma, uncomplicated: Secondary | ICD-10-CM | POA: Insufficient documentation

## 2015-05-02 DIAGNOSIS — Z87891 Personal history of nicotine dependence: Secondary | ICD-10-CM | POA: Insufficient documentation

## 2015-05-02 DIAGNOSIS — Z79899 Other long term (current) drug therapy: Secondary | ICD-10-CM | POA: Insufficient documentation

## 2015-05-02 DIAGNOSIS — E079 Disorder of thyroid, unspecified: Secondary | ICD-10-CM | POA: Insufficient documentation

## 2015-05-02 DIAGNOSIS — Z862 Personal history of diseases of the blood and blood-forming organs and certain disorders involving the immune mechanism: Secondary | ICD-10-CM | POA: Insufficient documentation

## 2015-05-02 DIAGNOSIS — K0889 Other specified disorders of teeth and supporting structures: Secondary | ICD-10-CM

## 2015-05-02 MED ORDER — ACETAMINOPHEN 325 MG PO TABS
650.0000 mg | ORAL_TABLET | Freq: Once | ORAL | Status: AC
  Administered 2015-05-02: 650 mg via ORAL
  Filled 2015-05-02: qty 2

## 2015-05-02 MED ORDER — ACETAMINOPHEN 500 MG PO TABS: 500.0000 mg | ORAL_TABLET | Freq: Four times a day (QID) | ORAL | Status: DC | PRN

## 2015-05-02 MED ORDER — PENICILLIN V POTASSIUM 500 MG PO TABS: 500.0000 mg | ORAL_TABLET | Freq: Four times a day (QID) | ORAL | Status: AC

## 2015-05-02 MED ORDER — IBUPROFEN 800 MG PO TABS: 800.0000 mg | ORAL_TABLET | Freq: Three times a day (TID) | ORAL | Status: DC

## 2015-05-02 MED ORDER — IBUPROFEN 400 MG PO TABS
800.0000 mg | ORAL_TABLET | Freq: Once | ORAL | Status: AC
  Administered 2015-05-02: 800 mg via ORAL
  Filled 2015-05-02: qty 2

## 2015-05-02 NOTE — ED Notes (Signed)
No distress.

## 2015-05-02 NOTE — ED Notes (Signed)
The pt is c/o a tootahce she broke off her tooth while eating 2 days ago.  Painful since  She has had excedrin  X 3 the last was approx 1330.  lmp  Last month

## 2015-05-02 NOTE — Discharge Instructions (Signed)
Dental Pain  ° ° °Dental pain may be caused by many things, including:  °Tooth decay (cavities or caries). Cavities expose the nerve of your tooth to air and hot or cold temperatures. This can cause pain or discomfort.  °Abscess or infection. A dental abscess is a collection of infected pus from a bacterial infection in the inner part of the tooth (pulp). It usually occurs at the end of the tooth's root.  °Injury.  °An unknown reason (idiopathic). °Your pain may be mild or severe. It may only occur when:  °You are chewing.  °You are exposed to hot or cold temperature.  °You are eating or drinking sugary foods or beverages, such as soda or candy. °Your pain may also be constant.  °HOME CARE INSTRUCTIONS  °Watch your dental pain for any changes. The following actions may help to lessen any discomfort that you are feeling:  °Take medicines only as directed by your dentist.  °If you were prescribed an antibiotic medicine, finish all of it even if you start to feel better.  °Keep all follow-up visits as directed by your dentist. This is important.  °Do not apply heat to the outside of your face.  °Rinse your mouth or gargle with salt water if directed by your dentist. This helps with pain and swelling.  °You can make salt water by adding ¼ tsp of salt to 1 cup of warm water. °Apply ice to the painful area of your face:  °Put ice in a plastic bag.  °Place a towel between your skin and the bag.  °Leave the ice on for 20 minutes, 2-3 times per day. °Avoid foods or drinks that cause you pain, such as:  °Very hot or very cold foods or drinks.  °Sweet or sugary foods or drinks. °SEEK MEDICAL CARE IF:  °Your pain is not controlled with medicines.  °Your symptoms are worse.  °You have new symptoms. °SEEK IMMEDIATE MEDICAL CARE IF:  °You are unable to open your mouth.  °You are having trouble breathing or swallowing.  °You have a fever.  °Your face, neck, or jaw is swollen. °This information is not intended to replace advice  given to you by your health care provider. Make sure you discuss any questions you have with your health care provider.  °Document Released: 04/05/2005 Document Revised: 08/20/2014 Document Reviewed: 04/01/2014  °Elsevier Interactive Patient Education ©2016 Elsevier Inc.  ° ° °Emergency Department Resource Guide °1) Find a Doctor and Pay Out of Pocket °Although you won't have to find out who is covered by your insurance plan, it is a good idea to ask around and get recommendations. You will then need to call the office and see if the doctor you have chosen will accept you as a new patient and what types of options they offer for patients who are self-pay. Some doctors offer discounts or will set up payment plans for their patients who do not have insurance, but you will need to ask so you aren't surprised when you get to your appointment. ° °2) Contact Your Local Health Department °Not all health departments have doctors that can see patients for sick visits, but many do, so it is worth a call to see if yours does. If you don't know where your local health department is, you can check in your phone book. The CDC also has a tool to help you locate your state's health department, and many state websites also have listings of all of their local health departments. ° °  3) Find a Walk-in Clinic °If your illness is not likely to be very severe or complicated, you may want to try a walk in clinic. These are popping up all over the country in pharmacies, drugstores, and shopping centers. They're usually staffed by nurse practitioners or physician assistants that have been trained to treat common illnesses and complaints. They're usually fairly quick and inexpensive. However, if you have serious medical issues or chronic medical problems, these are probably not your best option. ° °No Primary Care Doctor: °- Call [facility] at  832-8000 - they can help you locate a primary care doctor that  accepts your insurance,  provides certain services, etc. °- Physician Referral Service- 1-800-533-3463 ° °Chronic Pain Problems: °Organization         Address  Phone   Notes  °[facility]  (336) 297-2271 Patients need to be referred by their primary care doctor.  ° °Medication Assistance: °Organization         Address  Phone   Notes  °[place] [facility] [address] °[place], [place] [address] (336) 641-8030 --Must be a resident of [place] °-- Must have NO insurance coverage whatsoever (no Medicaid/ Medicare, etc.) °-- The pt. MUST have a primary care doctor that directs their care regularly and follows them in the community °  °[facility]  (866) 331-1348   °[employer]  (888) 892-1162   ° °Agencies that provide inexpensive medical care: °Organization         Address  Phone   Notes  °[facility]  (336) 832-8035   ° Internal Medicine    (336) 832-7272   °[facility] [address] °[place], [place] [address] (336) 832-4777   °[facility] [address], °[place] (336) 271-4999   °[facility]    (336) 373-0678   °[facility]    (336) 272-1050   °[facility] ° [address], [place] Phone:  (336) 832-4444, Fax:  (336) 832-4440 Hours of Operation:  9 am - 6 pm, M-F.  Also accepts Medicaid/Medicare and self-pay.  ° [facility] ° [address], [place] Phone: (336) 832-3150, Fax: (336) 832-3151. Hours of Operation:  8:30 am - 5:30 pm, M-F.  Also accepts Medicaid and self-pay.  °[facility] [place] [address], [place] Phone: (336) 878-6027   °[facility] [address], [place], [place] (336)723-1848, Ext. 123 Mondays & Thursdays: 7-9 AM.  First 15 patients are seen on a first come, first serve basis. °  ° °Medicaid-accepting [place] Providers: ° °Organization         Address  Phone    Notes  °[facility] [address], [place] (336) 641-2100 Also accepts self-pay patients.  °[facility] [address], [place] ° (336) 856-9996   °[facility] [address], [place] (336) 288-8857   °[facility] [address] [place] [address], [place] (336) 299-7000   °Veita Bland [address], [place]  ° (336) 373-1557 Only accepts [place] Access Medicaid patients after they have their name applied to their card.  ° °Self-Pay (no insurance) in [place]: ° °Organization         Address  Phone   Notes  °Sickle Cell Patients, [facility] [address], [place] (336) 832-1970   °[facility] [address], [place] (336) 832-4400   °[facility] ° [address], [place] (336) 992-4800   °[facility]/Dr. Osei-Bonsu ° [address] [place] [address], [place] or [address], [place] (336) 841-8500 Phone number for both [place] and [place] locations is the same.  °[facility] [address], [place] (336) 299-0000   °[facility] [address] [place] [address], [place] or [address] (336) 852-7530 °(336) 878-2260   °[facility] [address], [place] (336) 350-1642, phone; (336) 294-5005, fax Sees patients 1st and 3rd Saturday of every month.  Must not qualify for public or private insurance (i.e. Medicaid, Medicare, [place] Health Choice, Veterans' Benefits) • Household income should be no more than 200% of the poverty level •The clinic cannot treat you if you are pregnant or think you are pregnant • Sexually transmitted diseases are not treated at the clinic.  ° ° °Dental Care: °Organization         Address  Phone  Notes  °[facility] [facility] [address], [place] (336)  641-6152 Accepts children up to age 21 who are enrolled in Medicaid or [place] Health Choice; pregnant women with a Medicaid card; and children who have applied for Medicaid or [place] Health Choice, but were declined, whose parents can pay a reduced fee at time of service.  °[facility] [place]  [address], [place] (336) 641-7733 Accepts children up to age 21 who are enrolled in Medicaid or [place] Health Choice; pregnant women with a Medicaid card; and children who have applied for Medicaid or [place] Health Choice, but were declined, whose parents can pay a reduced fee at time of service.  °[facility] ° [address], [place] (336) 641-4533 Patients are seen by appointment only. Walk-ins are not accepted. [facility] will see patients 18 years of age and older. °Monday - Tuesday (8am-5pm) °Most Wednesdays (8:30-5pm) °$30 per visit, cash only  °[facility] ° [address], [place] (336) 641-4533 Patients are seen by appointment only. Walk-ins are not accepted. [facility] will see patients 18 years of age and older. °One Wednesday Evening (Monthly: Volunteer Based).  $30 per visit, cash only  °[facility]  (919) 537-3737 for adults; Children under age 4, call Graduate Pediatric Dentistry at (919) [phone]. Children aged 4-14, please call (919) [phone] to request a pediatric application. ° Dental services are provided in all areas of dental care including fillings, crowns and bridges, complete and partial dentures, implants, gum treatment, root canals, and extractions. Preventive care is also provided. Treatment is provided to both adults and children. °Patients are selected via a lottery and there is often a waiting list. °  °[facility] [address], ° ° (336) 763-8833 [email or web address] °  °[facility] [address], [place], [place] (336)723-1848, Ext.  123 Second and Fourth Thursday of each month, opens at 6:30 AM; Clinic ends at 9 AM.  Patients are seen on a first-come first-served basis, and a limited number are seen during each clinic.  ° °[facility] ° [address], [place],  (336) 723-7904   Eligibility Requirements °You must have lived in [place],   [place], or [place] counties for at least the last three months. °  You cannot be eligible for state or federal sponsored healthcare insurance, including Veterans Administration, Medicaid, or Medicare. °  You generally cannot be eligible for healthcare insurance through your employer.  °  How to apply: °Eligibility screenings are held every Tuesday and Wednesday afternoon from 1:00 pm until 4:00 pm. You do not need an appointment for the interview!  °[facility] [address], [place], [place] 336-631-2330   °[facility]  336-342-8273   °[facility]  336-703-3100   °[facility]  336-570-6415   ° °Behavioral Health Resources in the Community: °Intensive Outpatient Programs °Organization         Address  Phone  Notes  °[facility] [address], [place], [place] 336-878-6098   °[facility] [address], [place], [place] 336-832-9800   °[facility] [address], [place], [place] ° 336-882-2125   °[facility] [address],  °[place], [place] 1-800-853-5163 or 336-641-4981   °Substance Abuse Resources °Organization         Address  Phone  Notes  °[facility]  336-882-2125   °[facility]  336-784-9470   °[facility]  336-285-9073   °[facility]  336-845-3988   °Residential & Outpatient Substance Abuse Program  1-800-659-3381   °Psychological Services °Organization         Address  Phone  Notes  °[facility]  336- 832-9600   °[facility]  336- 378-7881   °[place] [address], [place] 1-800-853-5163 or 336-641-4981   ° °Mobile Crisis Teams °Organization         Address  Phone  Notes  °[facility], Mobile Crisis Care Unit  1-877-626-1772   °Assertive °[facility] ° [address] [place], [place] 336-834-9664   °Sharon DeEsch [address] °[place] 336-554-5454   ° °Self-Help/Support Groups °Organization         Address  Phone             Notes  °[facility] - variety of support groups  336- 373-1402 Call for more information  °Narcotics Anonymous (NA), [facility] [address], °[place]  2 meetings at this location  ° °Residential Treatment Programs °Organization         Address  Phone  Notes  °[facility] [address],    °[place]  1-866-801-8205   °[facility] ° [address], [place], [place] 704-293-8524   °[facility] [address], [place] 336-845-3988 Admissions: 8am-3pm M-F  °[facility] [address]    °[place], [place] 336-841-1104   °[facility] [address], [place], [place] 336-379-7146   °[facility] [address]  °[place], [place] 336-285-9073   °[facility] - Intensive Outpatient [address], [place], [place] 336-852-3033   °[facility] [address]  °[place], [place] 1-877-615-2722 or 336-784-9470   °[facility] [address] [place], [place] 336-227-7417 Accepts Medicaid  °[facility] [address]  ° [place] 1-800-659-3381 Substance Abuse/Addiction Treatment  ° °[place] Behavioral Health Resources °Organization         Address  Phone  Notes  °[facility]  (888) 581-9988   °Julie Brannon, PhD [address] [place], [place]   (336) 349-5553 or (336) 951-0000   °[facility]   [address] °[place], [place] (336) 349-4454   °[facility] [address], [place], [place] (336) 342-8316 Insurance/Medicaid/sponsorship through [facility]  °[facility] [address]                                    [place], [place] (336) 342-8316 Therapy/tele-psych/case  °[facility] [address]  ° [place], [place] (336) 349-2233    °Dr. Arfeen  (336) 349-4544   °[facility] [place]  [employer] [place] [facility] 1) [address], [place] °2) [address], [place] °3)  [address], [place] (336) 349-3220 °(336) 342-7768 ° °(336) 342-8140   °[place] [facility] (336) 342-1394 or (336) 342-3537 (After Hours)    ° ° ° °

## 2015-05-02 NOTE — ED Provider Notes (Signed)
CSN: [id number]     Arrival date & time 05/02/15  1719 History  By signing my name below, I, Doreatha MartinEva Mathews, attest that this documentation has been prepared under the direction and in the presence of Cheri FowlerKayla Holland Nickson, PA-C. Electronically Signed: Doreatha MartinEva Mathews, ED Scribe. 05/02/2015. 6:11 PM.    Chief Complaint  Patient presents with  . Dental Pain   The history is provided by the patient. No language interpreter was used.    HPI Comments: [name] is a 30 y.o. female with h/o anemia, asthma, grave's disease who presents to the Emergency Department complaining of moderate left upper dental pain onset 2 days ago. Pt states she was chewing gum when a piece of her already cracked tooth broke off. She reports she has tried Excedrine with mild temporary relief of pain. She is not insured and is not followed by a Dentist. NKDA. She denies fever, chills, tongue swelling, drooling, choking.   Past Medical History  Diagnosis Date  . Anemia     all 3 pregnancies  . Hyperemesis     3rd pregnancy  . Gestational hypertension 2010    with 3rd pregnancy  . Preterm labor     with 3rd pregnancy  . Asthma     albuterol inhaler, last used one year ago  . Thyroid disease     had radioactive iodine  . Grave's disease    Past Surgical History  Procedure Laterality Date  . Dilation and curettage of uterus  2010  . Laparoscopic tubal ligation  08/10/2011    Procedure: LAPAROSCOPIC TUBAL LIGATION;  Surgeon: Tereso NewcomerUgonna A Anyanwu, MD;  Location: [facility];  Service: Gynecology;  Laterality: Bilateral;   Family History  Problem Relation Age of Onset  . Hypertension Father   . Hypertension Mother   . Anemia Mother   . Bipolar disorder Mother   . Anesthesia problems Neg Hx   . Hypotension Neg Hx   . Malignant hyperthermia Neg Hx   . Pseudochol deficiency Neg Hx   . Thyroid disease Maternal Aunt   . Bipolar disorder Maternal Aunt   . Diabetes Maternal Uncle   . Diabetes Maternal Grandmother   . Diabetes  Paternal Grandmother   . Cancer Paternal Grandmother    Social History  Substance Use Topics  . Smoking status: Former Smoker -- 0.25 packs/day for .5 years    Types: Cigarettes    Quit date: 11/18/2011  . Smokeless tobacco: Never Used  . Alcohol Use: No   OB History    Gravida Para Term Preterm AB TAB SAB Ectopic Multiple Living   5 4 3 1 1  1   4      Review of Systems A complete 10 system review of systems was obtained and all systems are negative except as noted in the HPI and PMH.    Allergies  Shellfish allergy  Home Medications   Prior to Admission medications   Medication Sig Start Date End Date Taking? Authorizing Provider  acetaminophen (TYLENOL) 500 MG tablet Take 1 tablet (500 mg total) by mouth every 6 (six) hours as needed. 05/02/15   Cheri FowlerKayla Kysa Calais, PA-C  cetirizine (ZYRTEC) 10 MG tablet Take 1 tablet (10 mg total) by mouth daily. 07/08/14   Jamal CollinJames R Joyner, MD  cyclobenzaprine (FLEXERIL) 5 MG tablet Take 1 tablet (5 mg total) by mouth 3 (three) times daily as needed for muscle spasms. 07/02/14   Doreene ElandKehinde T Eniola, MD  hydrochlorothiazide (HYDRODIURIL) 25 MG tablet Take 1 tablet (25  mg total) by mouth daily. 08/06/14   Nani Ravens, MD  ibuprofen (ADVIL,MOTRIN) 800 MG tablet Take 1 tablet (800 mg total) by mouth 3 (three) times daily. 05/02/15   Cheri Fowler, PA-C  levothyroxine (SYNTHROID) 175 MCG tablet TAKE 1 TABLET(175 MCG) BY MOUTH DAILY BEFORE BREAKFAST 03/11/15   Campbell Stall, MD  penicillin v potassium (VEETID) 500 MG tablet Take 1 tablet (500 mg total) by mouth 4 (four) times daily. 05/02/15 05/09/15  Cheri Fowler, PA-C  sertraline (ZOLOFT) 50 MG tablet Take 1 tablet (50 mg total) by mouth daily. 08/14/14   Nani Ravens, MD   BP 139/96 mmHg  Pulse 66  Temp(Src) 98.5 F (36.9 C) (Oral)  Resp 18  Ht 5\' 6"  (1.676 m)  Wt 102.604 kg  BMI 36.53 kg/m2  SpO2 100%  LMP 04/01/2015 Physical Exam  Constitutional: She is oriented to person, place, and time. She appears  well-developed and well-nourished.  HENT:  Head: Normocephalic and atraumatic.  Mouth/Throat: Uvula is midline, oropharynx is clear and moist and mucous membranes are normal. No trismus in the jaw. Abnormal dentition. Dental caries present. No dental abscesses or uvula swelling.    No facial swelling or tongue swelling. Pt tolerating secretions without difficulty.   Eyes: Conjunctivae and EOM are normal. Pupils are equal, round, and reactive to light.  Neck: Normal range of motion. Neck supple.  No signs of Ludwig angina   Cardiovascular: Normal rate, regular rhythm and normal heart sounds.  Exam reveals no gallop and no friction rub.   No murmur heard. Pulmonary/Chest: Effort normal and breath sounds normal. No respiratory distress. She has no wheezes. She has no rales. She exhibits no tenderness.  Abdominal: Soft. Bowel sounds are normal. She exhibits no distension. There is no tenderness.  Musculoskeletal: Normal range of motion.  Neurological: She is alert and oriented to person, place, and time.  Skin: Skin is warm and dry.  Psychiatric: She has a normal mood and affect. Her behavior is normal.  Nursing note and vitals reviewed.   ED Course  Procedures (including critical care time) DIAGNOSTIC STUDIES: Oxygen Saturation is 100% on RA, normal by my interpretation.    COORDINATION OF CARE: 6:05 PM Discussed treatment plan with pt at bedside which includes 800 mg Ibuprofen and 600 mg Tylenol, Penicillin and pt agreed to plan.   MDM   Final diagnoses:  Pain, dental    Filed Vitals:   05/02/15 1745  BP: 139/96  Pulse: 66  Temp: 98.5 F (36.9 C)  Resp: 18    Meds given in ED:  Medications  ibuprofen (ADVIL,MOTRIN) tablet 800 mg (not administered)  acetaminophen (TYLENOL) tablet 650 mg (not administered)    New Prescriptions   ACETAMINOPHEN (TYLENOL) 500 MG TABLET    Take 1 tablet (500 mg total) by mouth every 6 (six) hours as needed.   IBUPROFEN (ADVIL,MOTRIN) 800  MG TABLET    Take 1 tablet (800 mg total) by mouth 3 (three) times daily.   PENICILLIN V POTASSIUM (VEETID) 500 MG TABLET    Take 1 tablet (500 mg total) by mouth 4 (four) times daily.     Patient with dentalgia.  No abscess requiring immediate incision and drainage.  Exam not concerning for Ludwig's angina or pharyngeal abscess.  Patient tolerating secretions without difficulty. Will treat with 800mg  Ibuprofen, 650mg  Tylenol, Penicillin VK. Pt instructed to follow-up with dentist and was provided referral list for self-pay dentists.  Discussed return precautions. Pt safe for discharge.  I personally performed the services described in this documentation, which was scribed in my presence. The recorded information has been reviewed and is accurate.     Cheri Fowler, PA-C 05/02/15 1823  Linwood Dibbles, MD 05/03/15 [phone]

## 2015-05-05 ENCOUNTER — Ambulatory Visit: Payer: Medicaid Other | Admitting: Internal Medicine

## 2015-08-05 ENCOUNTER — Ambulatory Visit: Payer: Medicaid Other

## 2015-10-06 ENCOUNTER — Inpatient Hospital Stay (HOSPITAL_COMMUNITY)
Admission: AD | Admit: 2015-10-06 | Discharge: 2015-10-06 | Disposition: A | Discharge status: Home / Self Care | Payer: Medicaid Other | Source: Ambulatory Visit | Attending: Obstetrics & Gynecology | Admitting: Obstetrics & Gynecology

## 2015-10-06 ENCOUNTER — Encounter (HOSPITAL_COMMUNITY): Payer: Self-pay | Admitting: *Deleted

## 2015-10-06 ENCOUNTER — Telehealth: Payer: Self-pay | Admitting: Student

## 2015-10-06 ENCOUNTER — Other Ambulatory Visit: Payer: Self-pay

## 2015-10-06 DIAGNOSIS — B9689 Other specified bacterial agents as the cause of diseases classified elsewhere: Secondary | ICD-10-CM

## 2015-10-06 DIAGNOSIS — Z87891 Personal history of nicotine dependence: Secondary | ICD-10-CM | POA: Insufficient documentation

## 2015-10-06 DIAGNOSIS — I10 Essential (primary) hypertension: Secondary | ICD-10-CM | POA: Insufficient documentation

## 2015-10-06 DIAGNOSIS — E039 Hypothyroidism, unspecified: Secondary | ICD-10-CM

## 2015-10-06 DIAGNOSIS — A499 Bacterial infection, unspecified: Secondary | ICD-10-CM

## 2015-10-06 DIAGNOSIS — N760 Acute vaginitis: Secondary | ICD-10-CM | POA: Insufficient documentation

## 2015-10-06 HISTORY — DX: Bipolar disorder, unspecified: F31.9

## 2015-10-06 LAB — URINALYSIS, ROUTINE W REFLEX MICROSCOPIC
Bilirubin Urine: NEGATIVE
GLUCOSE, UA: NEGATIVE mg/dL
Ketones, ur: NEGATIVE mg/dL
Nitrite: NEGATIVE
Protein, ur: NEGATIVE mg/dL
SPECIFIC GRAVITY, URINE: 1.015 (ref 1.005–1.030)
pH: 7 (ref 5.0–8.0)

## 2015-10-06 LAB — CBC
HEMATOCRIT: 38.6 % (ref 36.0–46.0)
HEMOGLOBIN: 13.2 g/dL (ref 12.0–15.0)
MCH: 31.5 pg (ref 26.0–34.0)
MCHC: 34.2 g/dL (ref 30.0–36.0)
MCV: 92.1 fL (ref 78.0–100.0)
Platelets: 352 10*3/uL (ref 150–400)
RBC: 4.19 MIL/uL (ref 3.87–5.11)
RDW: 13.6 % (ref 11.5–15.5)
WBC: 5.7 10*3/uL (ref 4.0–10.5)

## 2015-10-06 LAB — WET PREP, GENITAL
SPERM: NONE SEEN
TRICH WET PREP: NONE SEEN
Yeast Wet Prep HPF POC: NONE SEEN

## 2015-10-06 LAB — URINE MICROSCOPIC-ADD ON: RBC / HPF: NONE SEEN RBC/hpf (ref 0–5)

## 2015-10-06 LAB — POCT PREGNANCY, URINE: Preg Test, Ur: NEGATIVE

## 2015-10-06 LAB — TSH: TSH: 74.619 u[IU]/mL — AB (ref 0.350–4.500)

## 2015-10-06 MED ORDER — ONDANSETRON 8 MG PO TBDP
8.0000 mg | ORAL_TABLET | Freq: Once | ORAL | Status: AC
  Administered 2015-10-06: 8 mg via ORAL
  Filled 2015-10-06: qty 1

## 2015-10-06 MED ORDER — HYDROCHLOROTHIAZIDE 25 MG PO TABS: 25.0000 mg | ORAL_TABLET | Freq: Every day | ORAL | Status: DC

## 2015-10-06 MED ORDER — METRONIDAZOLE 500 MG PO TABS: 500.0000 mg | ORAL_TABLET | Freq: Two times a day (BID) | ORAL | Status: DC

## 2015-10-06 MED ORDER — LEVOTHYROXINE SODIUM 25 MCG PO TABS: 25.0000 ug | ORAL_TABLET | Freq: Every day | ORAL | Status: DC

## 2015-10-06 MED ORDER — HYDROCHLOROTHIAZIDE 25 MG PO TABS
25.0000 mg | ORAL_TABLET | Freq: Every day | ORAL | Status: DC
Start: 2015-10-06 — End: 2015-10-06
  Administered 2015-10-06: 25 mg via ORAL
  Filled 2015-10-06: qty 1

## 2015-10-06 MED ORDER — LISINOPRIL 20 MG PO TABS
20.0000 mg | ORAL_TABLET | Freq: Once | ORAL | Status: AC
  Administered 2015-10-06: 20 mg via ORAL
  Filled 2015-10-06: qty 1

## 2015-10-06 NOTE — MAU Note (Addendum)
Pt states she is also having some vision changes lately.  She has been seeing spots.  Blanche EastJ. Rasch, NP updated on BP and pt not currently taking any meds for last 6 months for chronic HTN, hypothyroidism, or bipolar disorder.

## 2015-10-06 NOTE — MAU Note (Signed)
Having a d/c, little heavier than usual.  No odor. Starting to have some irritation

## 2015-10-06 NOTE — MAU Note (Signed)
Hx of high blood pressure and thyroid problems, no insurance, no meds

## 2015-10-06 NOTE — MAU Provider Note (Signed)
History     CSN: [id number]  Arrival date and time: 10/06/15 [id number]   First Provider Initiated Contact with Patient 10/06/15 1024      Chief Complaint  Patient presents with  . Vaginal Discharge   HPI  [name] is a 30 y.o. female who presents for vaginal discharge. Symptoms x 1 week. Yellow discharge that alternated thin & thick with slightly foul odor. Reports some vaginal irritation. Denies vaginal bleeding, vomiting, dysuria, dyspareunia, or post coital bleeding. Reports same partner x 15 years but does want STD testing.   PMH significant for hypertension & hypothyroidism. Hasn't had her meds filled in 6+ months. Pt states she was going to [facility] but hasn't gone to recent appointments d/t lack of insurance and funds. States they always want money so she no shows to the appt.  Reports occasional headaches. Intermittent chest pain for the last few days. Feels like tightening just left of sternal border that wraps around left breast. Denies SOB or palpitations.   OB History    Gravida Para Term Preterm AB TAB SAB Ectopic Multiple Living   5 4 3 1 1  1   4       Past Medical History  Diagnosis Date  . Anemia     all 3 pregnancies  . Hyperemesis     3rd pregnancy  . Gestational hypertension 2010    with 3rd pregnancy  . Preterm labor     with 3rd pregnancy  . Asthma     albuterol inhaler, last used one year ago  . Thyroid disease     had radioactive iodine  . Grave's disease   . Bipolar disorder [facility](HCC)     Past Surgical History  Procedure Laterality Date  . Dilation and curettage of uterus  2010  . Laparoscopic tubal ligation  08/10/2011    Procedure: LAPAROSCOPIC TUBAL LIGATION;  Surgeon: Tereso NewcomerUgonna A Anyanwu, MD;  Location: [facility];  Service: Gynecology;  Laterality: Bilateral;    Family History  Problem Relation Age of Onset  . Hypertension Father   . Hypertension Mother   . Anemia Mother   . Bipolar disorder Mother   . Anesthesia problems Neg Hx   . Hypotension  Neg Hx   . Malignant hyperthermia Neg Hx   . Pseudochol deficiency Neg Hx   . Thyroid disease Maternal Aunt   . Bipolar disorder Maternal Aunt   . Diabetes Maternal Uncle   . Diabetes Maternal Grandmother   . Diabetes Paternal Grandmother   . Cancer Paternal Grandmother     Social History  Substance Use Topics  . Smoking status: Former Smoker -- 0.25 packs/day for .5 years    Types: Cigarettes    Quit date: 11/18/2011  . Smokeless tobacco: Never Used  . Alcohol Use: No    Allergies:  Allergies  Allergen Reactions  . Shellfish Allergy Swelling    Prescriptions prior to admission  Medication Sig Dispense Refill Last Dose  . acetaminophen (TYLENOL) 500 MG tablet Take 1 tablet (500 mg total) by mouth every 6 (six) hours as needed. (Patient not taking: Reported on 10/06/2015) 30 tablet 0   . cetirizine (ZYRTEC) 10 MG tablet Take 1 tablet (10 mg total) by mouth daily. (Patient not taking: Reported on 10/06/2015) 30 tablet 11   . cyclobenzaprine (FLEXERIL) 5 MG tablet Take 1 tablet (5 mg total) by mouth 3 (three) times daily as needed for muscle spasms. (Patient not taking: Reported on 10/06/2015) 15 tablet 0   .  hydrochlorothiazide (HYDRODIURIL) 25 MG tablet Take 1 tablet (25 mg total) by mouth daily. (Patient not taking: Reported on 10/06/2015) 90 tablet 3   . ibuprofen (ADVIL,MOTRIN) 800 MG tablet Take 1 tablet (800 mg total) by mouth 3 (three) times daily. (Patient not taking: Reported on 10/06/2015) 21 tablet 0   . levothyroxine (SYNTHROID) 175 MCG tablet TAKE 1 TABLET(175 MCG) BY MOUTH DAILY BEFORE BREAKFAST (Patient not taking: Reported on 10/06/2015) 30 tablet 5   . sertraline (ZOLOFT) 50 MG tablet Take 1 tablet (50 mg total) by mouth daily. (Patient not taking: Reported on 10/06/2015) 30 tablet 3     Review of Systems  Constitutional: Negative.   Respiratory: Negative.   Cardiovascular: Positive for chest pain. Negative for palpitations and leg swelling.  Gastrointestinal:  Positive for nausea. Negative for vomiting, abdominal pain, diarrhea and constipation.  Genitourinary: Negative for dysuria.       + vaginal discharge  Neurological: Positive for headaches.   Physical Exam   Blood pressure 139/107, pulse 63, temperature 98.8 F (37.1 C), temperature source Oral, resp. rate 18, weight 222 lb (100.699 kg), last menstrual period 09/18/2015.  Physical Exam  Nursing note and vitals reviewed. Constitutional: She is oriented to person, place, and time. She appears well-developed and well-nourished. No distress.  HENT:  Head: Normocephalic and atraumatic.  Eyes: Conjunctivae are normal. Right eye exhibits no discharge. Left eye exhibits no discharge. No scleral icterus.  Neck: Normal range of motion.  Cardiovascular: Normal rate, regular rhythm, normal heart sounds and intact distal pulses.   No murmur heard. Respiratory: Effort normal and breath sounds normal. No respiratory distress. She has no wheezes. She exhibits no tenderness.  GI: Soft. Bowel sounds are normal. There is no tenderness.  Genitourinary: Cervix exhibits no motion tenderness and no friability. No bleeding in the vagina. Vaginal discharge (small amount of thin white discharge) found.  Neurological: She is alert and oriented to person, place, and time.  Skin: Skin is warm and dry. She is not diaphoretic.  Psychiatric: She has a normal mood and affect. Her behavior is normal. Judgment and thought content normal.    MAU Course  Procedures Results for orders placed or performed during the hospital encounter of 10/06/15 (from the past 24 hour(s))  Urinalysis, Routine w reflex microscopic (not at [facility])     Status: Abnormal   Collection Time: 10/06/15  9:30 AM  Result Value Ref Range   Color, Urine YELLOW YELLOW   APPearance CLOUDY (A) CLEAR   Specific Gravity, Urine 1.015 1.005 - 1.030   pH 7.0 5.0 - 8.0   Glucose, UA NEGATIVE NEGATIVE mg/dL   Hgb urine dipstick TRACE (A) NEGATIVE    Bilirubin Urine NEGATIVE NEGATIVE   Ketones, ur NEGATIVE NEGATIVE mg/dL   Protein, ur NEGATIVE NEGATIVE mg/dL   Nitrite NEGATIVE NEGATIVE   Leukocytes, UA SMALL (A) NEGATIVE  Urine microscopic-add on     Status: Abnormal   Collection Time: 10/06/15  9:30 AM  Result Value Ref Range   Squamous Epithelial / LPF 0-5 (A) NONE SEEN   WBC, UA 0-5 0 - 5 WBC/hpf   RBC / HPF NONE SEEN 0 - 5 RBC/hpf   Bacteria, UA FEW (A) NONE SEEN  Pregnancy, urine POC     Status: None   Collection Time: 10/06/15  9:39 AM  Result Value Ref Range   Preg Test, Ur NEGATIVE NEGATIVE  CBC     Status: None   Collection Time: 10/06/15 10:47 AM  Result Value  Ref Range   WBC 5.7 4.0 - 10.5 K/uL   RBC 4.19 3.87 - 5.11 MIL/uL   Hemoglobin 13.2 12.0 - 15.0 g/dL   HCT 16.1 09.6 - 04.5 %   MCV 92.1 78.0 - 100.0 fL   MCH 31.5 26.0 - 34.0 pg   MCHC 34.2 30.0 - 36.0 g/dL   RDW 40.9 81.1 - 91.4 %   Platelets 352 150 - 400 K/uL  TSH     Status: Abnormal   Collection Time: 10/06/15 10:47 AM  Result Value Ref Range   TSH 74.619 (H) 0.350 - 4.500 uIU/mL  Wet prep, genital     Status: Abnormal   Collection Time: 10/06/15 10:55 AM  Result Value Ref Range   Yeast Wet Prep HPF POC NONE SEEN NONE SEEN   Trich, Wet Prep NONE SEEN NONE SEEN   Clue Cells Wet Prep HPF POC PRESENT (A) NONE SEEN   WBC, Wet Prep HPF POC FEW (A) NONE SEEN   Sperm NONE SEEN     MDM UPT negative HCTZ 25 mg PO while in MAU -Per review of EPIC, pt has multiple no shows with [facility] in the last year; does have appt scheduled with Dr. Nancy Marus on 7/3. S/w office who states patient just needs to pay "something" when she comes in, doesn't need to be full amount. Also says patient can't go to [facility] unless she is transferred by [facility].   EKG -- reviewed with Dr. [place] (cardiology). Similar compared to previous EKGs. States doesn't need urgent follow up; results likely d/t hypertension & obesity (lead placement). Would recommend echo in the  future which can be ordered by PCP.    Assessment and Plan  A: 1. BV (bacterial vaginosis)   2. Chronic hypertension   3. Hypothyroidism, unspecified hypothyroidism type     P: Discharge home Rx HCTZ 25 mg TSH pending GC/CT, HIV, RPR pending Stressed importance of keeping appts -- pt adamant that she will keep next appt -- pt given office number to call to find out what she needs to bring to appt for financial assistance Discussed reasons to return to [facility] vs [facility]  [name] 10/06/2015, 10:22 AM

## 2015-10-06 NOTE — Discharge Instructions (Signed)
Hypertension Hypertension, commonly called high blood pressure, is when the force of blood pumping through your arteries is too strong. Your arteries are the blood vessels that carry blood from your heart throughout your body. A blood pressure reading consists of a higher number over a lower number, such as 110/72. The higher number (systolic) is the pressure inside your arteries when your heart pumps. The lower number (diastolic) is the pressure inside your arteries when your heart relaxes. Ideally you want your blood pressure below 120/80. Hypertension forces your heart to work harder to pump blood. Your arteries may become narrow or stiff. Having untreated or uncontrolled hypertension can cause heart attack, stroke, kidney disease, and other problems. RISK FACTORS Some risk factors for high blood pressure are controllable. Others are not.  Risk factors you cannot control include:   Race. You may be at higher risk if you are African American.  Age. Risk increases with age.  Gender. Men are at higher risk than women before age 45 years. After age 65, women are at higher risk than men. Risk factors you can control include:  Not getting enough exercise or physical activity.  Being overweight.  Getting too much fat, sugar, calories, or salt in your diet.  Drinking too much alcohol. SIGNS AND SYMPTOMS Hypertension does not usually cause signs or symptoms. Extremely high blood pressure (hypertensive crisis) may cause headache, anxiety, shortness of breath, and nosebleed. DIAGNOSIS To check if you have hypertension, your health care provider will measure your blood pressure while you are seated, with your arm held at the level of your heart. It should be measured at least twice using the same arm. Certain conditions can cause a difference in blood pressure between your right and left arms. A blood pressure reading that is higher than normal on one occasion does not mean that you need treatment. If  it is not clear whether you have high blood pressure, you may be asked to return on a different day to have your blood pressure checked again. Or, you may be asked to monitor your blood pressure at home for 1 or more weeks. TREATMENT Treating high blood pressure includes making lifestyle changes and possibly taking medicine. Living a healthy lifestyle can help lower high blood pressure. You may need to change some of your habits. Lifestyle changes may include:  Following the DASH diet. This diet is high in fruits, vegetables, and whole grains. It is low in salt, red meat, and added sugars.  Keep your sodium intake below 2,300 mg per day.  Getting at least 30-45 minutes of aerobic exercise at least 4 times per week.  Losing weight if necessary.  Not smoking.  Limiting alcoholic beverages.  Learning ways to reduce stress. Your health care provider may prescribe medicine if lifestyle changes are not enough to get your blood pressure under control, and if one of the following is true:  You are 18-59 years of age and your systolic blood pressure is above 140.  You are 60 years of age or older, and your systolic blood pressure is above 150.  Your diastolic blood pressure is above 90.  You have diabetes, and your systolic blood pressure is over 140 or your diastolic blood pressure is over 90.  You have kidney disease and your blood pressure is above 140/90.  You have heart disease and your blood pressure is above 140/90. Your personal target blood pressure may vary depending on your medical conditions, your age, and other factors. HOME CARE INSTRUCTIONS    Have your blood pressure rechecked as directed by your health care provider.   Take medicines only as directed by your health care provider. Follow the directions carefully. Blood pressure medicines must be taken as prescribed. The medicine does not work as well when you skip doses. Skipping doses also puts you at risk for  problems.  Do not smoke.   Monitor your blood pressure at home as directed by your health care provider. SEEK MEDICAL CARE IF:   You think you are having a reaction to medicines taken.  You have recurrent headaches or feel dizzy.  You have swelling in your ankles.  You have trouble with your vision. SEEK IMMEDIATE MEDICAL CARE IF:  You develop a severe headache or confusion.  You have unusual weakness, numbness, or feel faint.  You have severe chest or abdominal pain.  You vomit repeatedly.  You have trouble breathing. MAKE SURE YOU:   Understand these instructions.  Will watch your condition.  Will get help right away if you are not doing well or get worse.   This information is not intended to replace advice given to you by your health care provider. Make sure you discuss any questions you have with your health care provider.   Document Released: 04/05/2005 Document Revised: 08/20/2014 Document Reviewed: 01/26/2013 Elsevier Interactive Patient Education 2016 Elsevier Inc.  

## 2015-10-06 NOTE — Telephone Encounter (Signed)
Called pt to inform of TSH results & rx sent to pharmacy for levothyroxine.  No answer & no option for voicemail.

## 2015-10-07 LAB — GC/CHLAMYDIA PROBE AMP (~~LOC~~) NOT AT ARMC
CHLAMYDIA, DNA PROBE: NEGATIVE
NEISSERIA GONORRHEA: NEGATIVE

## 2015-10-07 LAB — RPR: RPR: NONREACTIVE

## 2015-10-07 LAB — HIV ANTIBODY (ROUTINE TESTING W REFLEX): HIV SCREEN 4TH GENERATION: NONREACTIVE

## 2015-10-20 ENCOUNTER — Ambulatory Visit: Payer: Medicaid Other | Admitting: Internal Medicine

## 2015-10-30 ENCOUNTER — Other Ambulatory Visit: Payer: Self-pay | Admitting: Student

## 2016-01-02 ENCOUNTER — Encounter: Payer: Self-pay | Admitting: Pharmacist

## 2016-01-02 LAB — GLUCOSE, POCT (MANUAL RESULT ENTRY): POC Glucose: 83 mg/dl (ref 70–99)

## 2016-01-02 NOTE — Progress Notes (Unsigned)
[facility] by Lavinia SharpsMary Ann Placey, NP.  BG (83) WNL, BP mildly elevated, TSH on 10/06/15 was 74. Prescriptions called into [facility] pharmacy for levothyroxine and hydrochlorothiazide. Advised to [facility]. Appointment scheduled next week with [facility]. Patient verbalized understanding.

## 2016-01-09 ENCOUNTER — Ambulatory Visit: Payer: Medicaid Other | Admitting: Internal Medicine

## 2016-02-18 ENCOUNTER — Emergency Department (HOSPITAL_COMMUNITY): Payer: Medicaid Other

## 2016-02-18 ENCOUNTER — Encounter (HOSPITAL_COMMUNITY): Payer: Self-pay

## 2016-02-18 ENCOUNTER — Emergency Department (HOSPITAL_COMMUNITY)
Admission: EM | Admit: 2016-02-18 | Discharge: 2016-02-18 | Disposition: A | Discharge status: Home / Self Care | Payer: Medicaid Other | Attending: Emergency Medicine | Admitting: Emergency Medicine

## 2016-02-18 DIAGNOSIS — E039 Hypothyroidism, unspecified: Secondary | ICD-10-CM | POA: Insufficient documentation

## 2016-02-18 DIAGNOSIS — I10 Essential (primary) hypertension: Secondary | ICD-10-CM | POA: Insufficient documentation

## 2016-02-18 DIAGNOSIS — J069 Acute upper respiratory infection, unspecified: Secondary | ICD-10-CM | POA: Insufficient documentation

## 2016-02-18 DIAGNOSIS — J45909 Unspecified asthma, uncomplicated: Secondary | ICD-10-CM | POA: Insufficient documentation

## 2016-02-18 DIAGNOSIS — F909 Attention-deficit hyperactivity disorder, unspecified type: Secondary | ICD-10-CM | POA: Insufficient documentation

## 2016-02-18 DIAGNOSIS — Z87891 Personal history of nicotine dependence: Secondary | ICD-10-CM | POA: Insufficient documentation

## 2016-02-18 MED ORDER — ONDANSETRON 4 MG PO TBDP
8.0000 mg | ORAL_TABLET | Freq: Once | ORAL | Status: AC
  Administered 2016-02-18: 8 mg via ORAL
  Filled 2016-02-18: qty 2

## 2016-02-18 MED ORDER — GUAIFENESIN 100 MG/5ML PO LIQD: 100.0000 mg | ORAL | 0 refills | Status: DC | PRN

## 2016-02-18 MED ORDER — ONDANSETRON HCL 4 MG PO TABS: 4.0000 mg | ORAL_TABLET | Freq: Four times a day (QID) | ORAL | 0 refills | Status: DC

## 2016-02-18 MED ORDER — IPRATROPIUM-ALBUTEROL 0.5-2.5 (3) MG/3ML IN SOLN
3.0000 mL | Freq: Once | RESPIRATORY_TRACT | Status: AC
  Administered 2016-02-18: 3 mL via RESPIRATORY_TRACT
  Filled 2016-02-18: qty 3

## 2016-02-18 MED ORDER — ALBUTEROL SULFATE HFA 108 (90 BASE) MCG/ACT IN AERS
2.0000 | INHALATION_SPRAY | RESPIRATORY_TRACT | Status: DC | PRN
  Administered 2016-02-18: 2 via RESPIRATORY_TRACT
  Filled 2016-02-18: qty 6.7

## 2016-02-18 NOTE — ED Notes (Signed)
ED Provider at bedside. 

## 2016-02-18 NOTE — ED Notes (Signed)
Wheeled pt back to room from waiting room. Pt placed in gown and on monitor. 

## 2016-02-18 NOTE — ED Provider Notes (Signed)
[facility]-EMERGENCY DEPT Provider Note   CSN: [id number] Arrival date & time: 02/18/16  7829     History   Chief Complaint Chief Complaint  Patient presents with  . Cough  . Nasal Congestion    HPI [name] is a 30 y.o. female.  Patient presents to the emergency department with chief complaint of cough and cold symptoms. She states that she noticed a productive cough about 3-4 days ago. She also reports sinus and nasal congestion. She states that she does have some tightness in her chest, and does have a history of asthma, but is currently out of her inhaler. She also reports some associated nausea and vomiting, but denies any abdominal pain, dysuria, or any other symptoms. She has tried taking allergy medications with mild relief. There are no other associated symptoms or modifying factors.   The history is provided by the patient. No language interpreter was used.    Past Medical History:  Diagnosis Date  . Anemia    all 3 pregnancies  . Asthma    albuterol inhaler, last used one year ago  . Bipolar disorder (HCC)   . Gestational hypertension 2010   with 3rd pregnancy  . Grave's disease   . Hyperemesis    3rd pregnancy  . Preterm labor    with 3rd pregnancy  . Thyroid disease    had radioactive iodine    Patient Active Problem List   Diagnosis Date Noted  . Depression 08/18/2014  . 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 Seasonal allergies 07/08/2014  . 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 Myalgia 07/03/2014  . Hypothyroidism 07/03/2014  . AKI (acute kidney injury) (HCC) 06/28/2014  . Neck pain 11/22/2013  . Chest pain, unspecified 09/11/2013  . Encounter for post Essure sterilization check 08/28/2013  . 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 Hypertension, uncontrolled 07/06/2012  . 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 Asthma 12/16/2010  . Anemia     Past Surgical History:  Procedure Laterality Date  . DILATION AND CURETTAGE  OF UTERUS  2010  . LAPAROSCOPIC TUBAL LIGATION  08/10/2011   Procedure: LAPAROSCOPIC TUBAL LIGATION;  Surgeon: Tereso Newcomer, MD;  Location: [facility];  Service: Gynecology;  Laterality: Bilateral;    OB History    Gravida Para Term Preterm AB Living   5 4 3 1 1 4    SAB TAB Ectopic Multiple Live Births   1       4       Home Medications    Prior to Admission medications   Medication Sig Start Date End Date Taking? Authorizing Provider  hydrochlorothiazide (HYDRODIURIL) 25 MG tablet Take 1 tablet (25 mg total) by mouth daily. 10/06/15   Judeth Horn, NP  levothyroxine (LEVOTHROID) 25 MCG tablet Take 1 tablet (25 mcg total) by mouth daily before breakfast. 10/06/15   Judeth Horn, NP  metroNIDAZOLE (FLAGYL) 500 MG tablet Take 1 tablet (500 mg total) by mouth 2 (two) times daily. 10/06/15   Judeth Horn, NP    Family History Family History  Problem Relation Age of Onset  . Hypertension Father   . Hypertension Mother   . Anemia Mother   . Bipolar disorder Mother   . Thyroid disease Maternal Aunt   . Bipolar disorder Maternal Aunt   . Diabetes Maternal Uncle   . Diabetes Maternal Grandmother   . Diabetes Paternal Grandmother   . Cancer Paternal Grandmother   . Anesthesia problems Neg Hx   . Hypotension Neg  Hx   . Malignant hyperthermia Neg Hx   . Pseudochol deficiency Neg Hx     Social History Social History  Substance Use Topics  . Smoking status: Former Smoker    Packs/day: 0.25    Years: 0.50    Types: Cigarettes    Quit date: 11/18/2011  . Smokeless tobacco: Never Used  . Alcohol use No     Allergies   Shellfish allergy   Review of Systems Review of Systems  Constitutional: Positive for chills. Negative for fever.  HENT: Positive for postnasal drip, rhinorrhea, sinus pressure, sneezing and sore throat.   Respiratory: Positive for cough. Negative for shortness of breath.   Cardiovascular: Negative for chest pain.  Gastrointestinal: Negative for abdominal  pain, constipation, diarrhea, nausea and vomiting.  Genitourinary: Negative for dysuria.     Physical Exam Updated Vital Signs BP 123/94 (BP Location: Right Arm)   Pulse 64   Temp 98.1 F (36.7 C) (Oral)   Resp 16   Ht 5\' 6"  (1.676 m)   Wt 88.9 kg   LMP 02/15/2016   SpO2 100%   BMI 31.64 kg/m   Physical Exam  Constitutional: She is oriented to person, place, and time. She appears well-developed and well-nourished.  HENT:  Head: Normocephalic and atraumatic.  Eyes: Conjunctivae and EOM are normal. Pupils are equal, round, and reactive to light.  Neck: Normal range of motion. Neck supple.  Cardiovascular: Normal rate and regular rhythm.  Exam reveals no gallop and no friction rub.   No murmur heard. Pulmonary/Chest: Effort normal and breath sounds normal. No respiratory distress. She has no wheezes. She has no rales. She exhibits no tenderness.  Diminished lung sounds  Abdominal: Soft. Bowel sounds are normal. She exhibits no distension and no mass. There is no tenderness. There is no rebound and no guarding.  Musculoskeletal: Normal range of motion. She exhibits no edema or tenderness.  Neurological: She is alert and oriented to person, place, and time.  Skin: Skin is warm and dry.  Psychiatric: She has a normal mood and affect. Her behavior is normal. Judgment and thought content normal.  Nursing note and vitals reviewed.    ED Treatments / Results  Labs (all labs ordered are listed, but only abnormal results are displayed) Labs Reviewed - No data to display  EKG  EKG Interpretation None       Radiology Dg Chest 2 View  Result Date: 02/18/2016 CLINICAL DATA:  Cough. EXAM: CHEST  2 VIEW COMPARISON:  12/30/2013 FINDINGS: The heart size and mediastinal contours are within normal limits. Both lungs are clear. The visualized skeletal structures are unremarkable. IMPRESSION: No active cardiopulmonary disease. Electronically Signed   By: Marlan Palauharles  Clark M.D.   On:  02/18/2016 09:40    Procedures Procedures (including critical care time)  Medications Ordered in ED Medications  ipratropium-albuterol (DUONEB) 0.5-2.5 (3) MG/3ML nebulizer solution 3 mL (not administered)  ondansetron (ZOFRAN-ODT) disintegrating tablet 8 mg (not administered)     Initial Impression / Assessment and Plan / ED Course  I have reviewed the triage vital signs and the nursing notes.  Pertinent labs & imaging results that were available during my care of the patient were reviewed by me and considered in my medical decision making (see chart for details).  Clinical Course    Patient with cough and cold symptoms. History of asthma, but does not have an inhaler. Lung sounds are diminished, but no wheezes are heard. I will give breathing treatment to see if  this helps her any. Her chest x-ray is negative. She is well-appearing. Anticipate discharge with symptomatic therapy.  Better lung sounds after breathing treatment.  Offered an additional neb, but patient asks to be discharged with an inhaler.  States she will return if symptoms worsen.  Feels comfortable going home now.  Final Clinical Impressions(s) / ED Diagnoses   Final diagnoses:  Upper respiratory tract infection, unspecified type    New Prescriptions New Prescriptions   GUAIFENESIN (ROBITUSSIN) 100 MG/5ML LIQUID    Take 5-10 mLs (100-200 mg total) by mouth every 4 (four) hours as needed for cough.   ONDANSETRON (ZOFRAN) 4 MG TABLET    Take 1 tablet (4 mg total) by mouth every 6 (six) hours.     Roxy Horsemanobert Rekita Miotke, PA-C 02/18/16 1116    Arby BarretteMarcy Pfeiffer, MD 03/01/16 [phone]

## 2016-02-18 NOTE — ED Triage Notes (Addendum)
Patient complains of 3-4 days of cough with increased congestion. States that she is out of her inhaler and thinks related to her asthma. States she is coughing so much makes her gag and vomit, NAD  On arrival. Dry heaves on arrival

## 2016-02-18 NOTE — ED Notes (Signed)
Pt. Ambulatory at discharge with no further questions. NAD.

## 2016-03-04 ENCOUNTER — Ambulatory Visit: Payer: Medicaid Other | Admitting: Family Medicine

## 2016-11-22 ENCOUNTER — Inpatient Hospital Stay (HOSPITAL_COMMUNITY)
Admission: AD | Admit: 2016-11-22 | Discharge: 2016-11-22 | Disposition: A | Discharge status: Home / Self Care | Payer: Medicaid Other | Source: Ambulatory Visit | Attending: Obstetrics & Gynecology | Admitting: Obstetrics & Gynecology

## 2016-11-22 ENCOUNTER — Encounter (HOSPITAL_COMMUNITY): Payer: Self-pay | Admitting: *Deleted

## 2016-11-22 DIAGNOSIS — M545 Low back pain, unspecified: Secondary | ICD-10-CM

## 2016-11-22 DIAGNOSIS — I10 Essential (primary) hypertension: Secondary | ICD-10-CM | POA: Insufficient documentation

## 2016-11-22 DIAGNOSIS — Z9114 Patient's other noncompliance with medication regimen: Secondary | ICD-10-CM

## 2016-11-22 DIAGNOSIS — Z5989 Other problems related to housing and economic circumstances: Secondary | ICD-10-CM

## 2016-11-22 DIAGNOSIS — Z3202 Encounter for pregnancy test, result negative: Secondary | ICD-10-CM | POA: Insufficient documentation

## 2016-11-22 DIAGNOSIS — R7989 Other specified abnormal findings of blood chemistry: Secondary | ICD-10-CM

## 2016-11-22 DIAGNOSIS — Z87891 Personal history of nicotine dependence: Secondary | ICD-10-CM | POA: Insufficient documentation

## 2016-11-22 DIAGNOSIS — R102 Pelvic and perineal pain: Secondary | ICD-10-CM

## 2016-11-22 DIAGNOSIS — Z598 Other problems related to housing and economic circumstances: Secondary | ICD-10-CM

## 2016-11-22 DIAGNOSIS — N946 Dysmenorrhea, unspecified: Secondary | ICD-10-CM | POA: Insufficient documentation

## 2016-11-22 LAB — CBC WITH DIFFERENTIAL/PLATELET
Basophils Absolute: 0 10*3/uL (ref 0.0–0.1)
Basophils Relative: 1 %
EOS ABS: 0.1 10*3/uL (ref 0.0–0.7)
Eosinophils Relative: 1 %
HEMATOCRIT: 38.8 % (ref 36.0–46.0)
HEMOGLOBIN: 13.2 g/dL (ref 12.0–15.0)
LYMPHS ABS: 2.2 10*3/uL (ref 0.7–4.0)
LYMPHS PCT: 46 %
MCH: 32 pg (ref 26.0–34.0)
MCHC: 34 g/dL (ref 30.0–36.0)
MCV: 94.2 fL (ref 78.0–100.0)
MONOS PCT: 6 %
Monocytes Absolute: 0.3 10*3/uL (ref 0.1–1.0)
NEUTROS PCT: 46 %
Neutro Abs: 2.2 10*3/uL (ref 1.7–7.7)
Platelets: 340 10*3/uL (ref 150–400)
RBC: 4.12 MIL/uL (ref 3.87–5.11)
RDW: 13.2 % (ref 11.5–15.5)
WBC: 4.8 10*3/uL (ref 4.0–10.5)

## 2016-11-22 LAB — URINALYSIS, ROUTINE W REFLEX MICROSCOPIC
BILIRUBIN URINE: NEGATIVE
Glucose, UA: NEGATIVE mg/dL
Hgb urine dipstick: NEGATIVE
Ketones, ur: NEGATIVE mg/dL
Leukocytes, UA: NEGATIVE
NITRITE: NEGATIVE
PH: 6 (ref 5.0–8.0)
Protein, ur: NEGATIVE mg/dL
SPECIFIC GRAVITY, URINE: 1.017 (ref 1.005–1.030)

## 2016-11-22 LAB — COMPREHENSIVE METABOLIC PANEL
ALK PHOS: 58 U/L (ref 38–126)
ALT: 18 U/L (ref 14–54)
ANION GAP: 6 (ref 5–15)
AST: 26 U/L (ref 15–41)
Albumin: 4.5 g/dL (ref 3.5–5.0)
BILIRUBIN TOTAL: 0.5 mg/dL (ref 0.3–1.2)
BUN: 9 mg/dL (ref 6–20)
CO2: 29 mmol/L (ref 22–32)
CREATININE: 1.13 mg/dL — AB (ref 0.44–1.00)
Calcium: 9.2 mg/dL (ref 8.9–10.3)
Chloride: 103 mmol/L (ref 101–111)
Glucose, Bld: 90 mg/dL (ref 65–99)
Potassium: 3.7 mmol/L (ref 3.5–5.1)
Sodium: 138 mmol/L (ref 135–145)
Total Protein: 8.3 g/dL — ABNORMAL HIGH (ref 6.5–8.1)

## 2016-11-22 LAB — WET PREP, GENITAL
Clue Cells Wet Prep HPF POC: NONE SEEN
SPERM: NONE SEEN
TRICH WET PREP: NONE SEEN
YEAST WET PREP: NONE SEEN

## 2016-11-22 LAB — POCT PREGNANCY, URINE: Preg Test, Ur: NEGATIVE

## 2016-11-22 MED ORDER — HYDROCHLOROTHIAZIDE 25 MG PO TABS: 25.0000 mg | ORAL_TABLET | Freq: Every day | ORAL | 3 refills | Status: DC

## 2016-11-22 MED ORDER — KETOROLAC TROMETHAMINE 60 MG/2ML IM SOLN
60.0000 mg | Freq: Once | INTRAMUSCULAR | Status: AC
  Administered 2016-11-22: 60 mg via INTRAMUSCULAR
  Filled 2016-11-22: qty 2

## 2016-11-22 MED ORDER — CYCLOBENZAPRINE HCL 5 MG PO TABS: 5.0000 mg | ORAL_TABLET | Freq: Three times a day (TID) | ORAL | 0 refills | Status: DC | PRN

## 2016-11-22 MED ORDER — LEVOTHYROXINE SODIUM 25 MCG PO TABS: 25.0000 ug | ORAL_TABLET | Freq: Every day | ORAL | 1 refills | Status: DC

## 2016-11-22 NOTE — MAU Note (Addendum)
Pt reports had Essure inserted 2-3 years ago, states "something is wrong".  Reports heavy periods for past 2-3 months.  Pt states having abdominal & lower back pain, bilateral leg numbness.  Currently no vag bleeding.

## 2016-11-22 NOTE — MAU Note (Addendum)
Pt C/O lower abdominal pain for the past week, also back pain, it started with her period & has continued ever since.   States her legs are getting numb especially when bending over.

## 2016-11-22 NOTE — MAU Note (Signed)
Pt's BP elevated in triage, pt states she hx of HTN, isn't on meds because she can't pay for them.

## 2016-11-22 NOTE — Progress Notes (Signed)
Cultures obtained & sent to lab.

## 2016-11-22 NOTE — Discharge Instructions (Signed)
Abdominal Pain, Adult Abdominal pain can be caused by many things. Often, abdominal pain is not serious and it gets better with no treatment or by being treated at home. However, sometimes abdominal pain is serious. Your health care provider will do a medical history and a physical exam to try to determine the cause of your abdominal pain. Follow these instructions at home:  Take over-the-counter and prescription medicines only as told by your health care provider. Do not take a laxative unless told by your health care provider.  Drink enough fluid to keep your urine clear or pale yellow.  Watch your condition for any changes.  Keep all follow-up visits as told by your health care provider. This is important. Contact a health care provider if:  Your abdominal pain changes or gets worse.  You are not hungry or you lose weight without trying.  You are constipated or have diarrhea for more than 2-3 days.  You have pain when you urinate or have a bowel movement.  Your abdominal pain wakes you up at night.  Your pain gets worse with meals, after eating, or with certain foods.  You are throwing up and cannot keep anything down.  You have a fever. Get help right away if:  Your pain does not go away as soon as your health care provider told you to expect.  You cannot stop throwing up.  Your pain is only in areas of the abdomen, such as the right side or the left lower portion of the abdomen.  You have bloody or black stools, or stools that look like tar.  You have severe pain, cramping, or bloating in your abdomen.  You have signs of dehydration, such as: ? Dark urine, very little urine, or no urine. ? Cracked lips. ? Dry mouth. ? Sunken eyes. ? Sleepiness. ? Weakness. This information is not intended to replace advice given to you by your health care provider. Make sure you discuss any questions you have with your health care provider. Document Released: 01/13/2005 Document  Revised: 10/24/2015 Document Reviewed: 09/17/2015 Elsevier Interactive Patient Education  2017 [employer]  Hypertension Hypertension, commonly called high blood pressure, is when the force of blood pumping through the arteries is too strong. The arteries are the blood vessels that carry blood from the heart throughout the body. Hypertension forces the heart to work harder to pump blood and may cause arteries to become narrow or stiff. Having untreated or uncontrolled hypertension can cause heart attacks, strokes, kidney disease, and other problems. A blood pressure reading consists of a higher number over a lower number. Ideally, your blood pressure should be below 120/80. The first ("top") number is called the systolic pressure. It is a measure of the pressure in your arteries as your heart beats. The second ("bottom") number is called the diastolic pressure. It is a measure of the pressure in your arteries as the heart relaxes. What are the causes? The cause of this condition is not known. What increases the risk? Some risk factors for high blood pressure are under your control. Others are not. Factors you can change  Smoking.  Having type 2 diabetes mellitus, high cholesterol, or both.  Not getting enough exercise or physical activity.  Being overweight.  Having too much fat, sugar, calories, or salt (sodium) in your diet.  Drinking too much alcohol. Factors that are difficult or impossible to change  Having chronic kidney disease.  Having a family history of high blood pressure.  Age. Risk  increases with age.  Race. You may be at higher risk if you are African-American.  Gender. Men are at higher risk than women before age 30. After age 30, women are at higher risk than men.  Having obstructive sleep apnea.  Stress. What are the signs or symptoms? Extremely high blood pressure (hypertensive crisis) may cause:  Headache.  Anxiety.  Shortness of  breath.  Nosebleed.  Nausea and vomiting.  Severe chest pain.  Jerky movements you cannot control (seizures).  How is this diagnosed? This condition is diagnosed by measuring your blood pressure while you are seated, with your arm resting on a surface. The cuff of the blood pressure monitor will be placed directly against the skin of your upper arm at the level of your heart. It should be measured at least twice using the same arm. Certain conditions can cause a difference in blood pressure between your right and left arms. Certain factors can cause blood pressure readings to be lower or higher than normal (elevated) for a short period of time:  When your blood pressure is higher when you are in a health care provider's office than when you are at home, this is called white coat hypertension. Most people with this condition do not need medicines.  When your blood pressure is higher at home than when you are in a health care provider's office, this is called masked hypertension. Most people with this condition may need medicines to control blood pressure.  If you have a high blood pressure reading during one visit or you have normal blood pressure with other risk factors:  You may be asked to return on a different day to have your blood pressure checked again.  You may be asked to monitor your blood pressure at home for 1 week or longer.  If you are diagnosed with hypertension, you may have other blood or imaging tests to help your health care provider understand your overall risk for other conditions. How is this treated? This condition is treated by making healthy lifestyle changes, such as eating healthy foods, exercising more, and reducing your alcohol intake. Your health care provider may prescribe medicine if lifestyle changes are not enough to get your blood pressure under control, and if:  Your systolic blood pressure is above 130.  Your diastolic blood pressure is above  80.  Your personal target blood pressure may vary depending on your medical conditions, your age, and other factors. Follow these instructions at home: Eating and drinking  Eat a diet that is high in fiber and potassium, and low in sodium, added sugar, and fat. An example eating plan is called the DASH (Dietary Approaches to Stop Hypertension) diet. To eat this way: ? Eat plenty of fresh fruits and vegetables. Try to fill half of your plate at each meal with fruits and vegetables. ? Eat whole grains, such as whole wheat pasta, brown rice, or whole grain bread. Fill about one quarter of your plate with whole grains. ? Eat or drink low-fat dairy products, such as skim milk or low-fat yogurt. ? Avoid fatty cuts of meat, processed or cured meats, and poultry with skin. Fill about one quarter of your plate with lean proteins, such as fish, chicken without skin, beans, eggs, and tofu. ? Avoid premade and processed foods. These tend to be higher in sodium, added sugar, and fat.  Reduce your daily sodium intake. Most people with hypertension should eat less than 1,500 mg of sodium a day.  Limit alcohol  intake to no more than 1 drink a day for nonpregnant women and 2 drinks a day for men. One drink equals 12 oz of beer, 5 oz of wine, or 1 oz of hard liquor. Lifestyle  Work with your health care provider to maintain a healthy body weight or to lose weight. Ask what an ideal weight is for you.  Get at least 30 minutes of exercise that causes your heart to beat faster (aerobic exercise) most days of the week. Activities may include walking, swimming, or biking.  Include exercise to strengthen your muscles (resistance exercise), such as pilates or lifting weights, as part of your weekly exercise routine. Try to do these types of exercises for 30 minutes at least 3 days a week.  Do not use any products that contain nicotine or tobacco, such as cigarettes and e-cigarettes. If you need help quitting, ask  your health care provider.  Monitor your blood pressure at home as told by your health care provider.  Keep all follow-up visits as told by your health care provider. This is important. Medicines  Take over-the-counter and prescription medicines only as told by your health care provider. Follow directions carefully. Blood pressure medicines must be taken as prescribed.  Do not skip doses of blood pressure medicine. Doing this puts you at risk for problems and can make the medicine less effective.  Ask your health care provider about side effects or reactions to medicines that you should watch for. Contact a health care provider if:  You think you are having a reaction to a medicine you are taking.  You have headaches that keep coming back (recurring).  You feel dizzy.  You have swelling in your ankles.  You have trouble with your vision. Get help right away if:  You develop a severe headache or confusion.  You have unusual weakness or numbness.  You feel faint.  You have severe pain in your chest or abdomen.  You vomit repeatedly.  You have trouble breathing. Summary  Hypertension is when the force of blood pumping through your arteries is too strong. If this condition is not controlled, it may put you at risk for serious complications.  Your personal target blood pressure may vary depending on your medical conditions, your age, and other factors. For most people, a normal blood pressure is less than 120/80.  Hypertension is treated with lifestyle changes, medicines, or a combination of both. Lifestyle changes include weight loss, eating a healthy, low-sodium diet, exercising more, and limiting alcohol. This information is not intended to replace advice given to you by your health care provider. Make sure you discuss any questions you have with your health care provider. Document Released: 04/05/2005 Document Revised: 03/03/2016 Document Reviewed: 03/03/2016 Elsevier  Interactive Patient Education  2018 [employer].  Hypothyroidism Hypothyroidism is a disorder of the thyroid. The thyroid is a large gland that is located in the lower front of the neck. The thyroid releases hormones that control how the body works. With hypothyroidism, the thyroid does not make enough of these hormones. What are the causes? Causes of hypothyroidism may include:  Viral infections.  Pregnancy.  Your own defense system (immune system) attacking your thyroid.  Certain medicines.  Birth defects.  Past radiation treatments to your head or neck.  Past treatment with radioactive iodine.  Past surgical removal of part or all of your thyroid.  Problems with the gland that is located in the center of your brain (pituitary).  What are the signs  or symptoms? Signs and symptoms of hypothyroidism may include:  Feeling as though you have no energy (lethargy).  Inability to tolerate cold.  Weight gain that is not explained by a change in diet or exercise habits.  Dry skin.  Coarse hair.  Menstrual irregularity.  Slowing of thought processes.  Constipation.  Sadness or depression.  How is this diagnosed? Your health care provider may diagnose hypothyroidism with blood tests and ultrasound tests. How is this treated? Hypothyroidism is treated with medicine that replaces the hormones that your body does not make. After you begin treatment, it may take several weeks for symptoms to go away. Follow these instructions at home:  Take medicines only as directed by your health care provider.  If you start taking any new medicines, tell your health care provider.  Keep all follow-up visits as directed by your health care provider. This is important. As your condition improves, your dosage needs may change. You will need to have blood tests regularly so that your health care provider can watch your condition. Contact a health care provider if:  Your symptoms do not  get better with treatment.  You are taking thyroid replacement medicine and: ? You sweat excessively. ? You have tremors. ? You feel anxious. ? You lose weight rapidly. ? You cannot tolerate heat. ? You have emotional swings. ? You have diarrhea. ? You feel weak. Get help right away if:  You develop chest pain.  You develop an irregular heartbeat.  You develop a rapid heartbeat. This information is not intended to replace advice given to you by your health care provider. Make sure you discuss any questions you have with your health care provider. Document Released: 04/05/2005 Document Revised: 09/11/2015 Document Reviewed: 08/21/2013 Elsevier Interactive Patient Education  2017 [employer]  Back Pain, Adult Back pain is very common. The pain often gets better over time. The cause of back pain is usually not dangerous. Most people can learn to manage their back pain on their own. Follow these instructions at home: Watch your back pain for any changes. The following actions may help to lessen any pain you are feeling:  Stay active. Start with short walks on flat ground if you can. Try to walk farther each day.  Exercise regularly as told by your doctor. Exercise helps your back heal faster. It also helps avoid future injury by keeping your muscles strong and flexible.  Do not sit, drive, or stand in one place for more than 30 minutes.  Do not stay in bed. Resting more than 1-2 days can slow down your recovery.  Be careful when you bend or lift an object. Use good form when lifting: ? Bend at your knees. ? Keep the object close to your body. ? Do not twist.  Sleep on a firm mattress. Lie on your side, and bend your knees. If you lie on your back, put a pillow under your knees.  Take medicines only as told by your doctor.  Put ice on the injured area. ? Put ice in a plastic bag. ? Place a towel between your skin and the bag. ? Leave the ice on for 20 minutes, 2-3 times a  day for the first 2-3 days. After that, you can switch between ice and heat packs.  Avoid feeling anxious or stressed. Find good ways to deal with stress, such as exercise.  Maintain a healthy weight. Extra weight puts stress on your back.  Contact a doctor if:  You have  pain that does not go away with rest or medicine.  You have worsening pain that goes down into your legs or buttocks.  You have pain that does not get better in one week.  You have pain at night.  You lose weight.  You have a fever or chills. Get help right away if:  You cannot control when you poop (bowel movement) or pee (urinate).  Your arms or legs feel weak.  Your arms or legs lose feeling (numbness).  You feel sick to your stomach (nauseous) or throw up (vomit).  You have belly (abdominal) pain.  You feel like you may pass out (faint). This information is not intended to replace advice given to you by your health care provider. Make sure you discuss any questions you have with your health care provider. Document Released: 09/22/2007 Document Revised: 09/11/2015 Document Reviewed: 08/07/2013 Elsevier Interactive Patient Education  [employer].

## 2016-11-22 NOTE — MAU Provider Note (Signed)
Chief Complaint:  Abdominal Pain; Back Pain; and leg numbness   First Provider Initiated Contact with Patient 11/22/16 1147       HPI: [name] is a 31 y.o. [id number] who presents to maternity admissions reporting lower abdominal pain since period started last week.  ALso has some low back pain.  Makes legs go numb at times.  Feels tight.  . She reports no vaginal bleeding, vaginal itching/burning, urinary symptoms, h/a, dizziness, n/v, or fever/chills.  Not concerned about STDs but wants testing.  BP noted to be elevated today.  Has diagnosis of Hypertension and "some kind of thyroid problem" but has not taken meds in a long time due to cost.  States does not have insurance. Did not know about $4 list.  Has rx'es for HCTZ and Synthroid.  Abdominal Pain  This is a recurrent problem. The current episode started in the past 7 days. The onset quality is gradual. The problem occurs constantly. The problem has been unchanged. The pain is located in the suprapubic region, LLQ and RLQ. The pain is moderate. The quality of the pain is cramping and aching. The abdominal pain does not radiate. Pertinent negatives include no constipation, diarrhea, dysuria, fever, frequency, headaches, myalgias, nausea or vomiting. Nothing aggravates the pain. The pain is relieved by nothing. She has tried nothing (Did not try ibuprofen, though has some at home) for the symptoms.  Back Pain  This is a recurrent problem. The current episode started in the past 7 days. The problem occurs constantly. The problem is unchanged. The pain is present in the lumbar spine. The quality of the pain is described as aching. The pain radiates to the left knee, right foot, right knee, left thigh, left foot and right thigh (not pain radiating, just numbness off and on). The pain is moderate. The symptoms are aggravated by bending. Associated symptoms include abdominal pain, numbness, pelvic pain and tingling. Pertinent negatives include no  dysuria, fever, headaches or weakness. She has tried nothing for the symptoms.   RN Note: Pt C/O lower abdominal pain for the past week, also back pain, it started with her period & has continued ever since.   States her legs are getting numb especially when bending over.   Past Medical History: Past Medical History:  Diagnosis Date  . Anemia    all 3 pregnancies  . Asthma    albuterol inhaler, last used one year ago  . Bipolar disorder (HCC)   . Gestational hypertension 2010   with 3rd pregnancy  . Grave's disease   . Hyperemesis    3rd pregnancy  . Preterm labor    with 3rd pregnancy  . Thyroid disease    had radioactive iodine    Past obstetric history: OB History  Gravida Para Term Preterm AB Living  5 4 3 1 1 4   SAB TAB Ectopic Multiple Live Births  1       4    # Outcome Date GA Lbr Len/2nd Weight Sex Delivery Anes PTL Lv  5 Term 02/02/11 [redacted]w[redacted]d 08:11 / 00:02 6 lb 6.7 oz (2.91 kg) F Vag-Spont None  LIV  4 Term 09/2009 [redacted]w[redacted]d  5 lb 3 oz (2.353 kg) M Vag-Spont EPI  LIV     Birth Comments: baby swallowed meconium- sent to NICU x 1 week  3 Preterm 10/2008 [redacted]w[redacted]d   F Vag-Spont EPI  LIV     Birth Comments: Induced for Pregnancy induced hypertension  2 SAB 12/2006  1 Term 09/2006 [redacted]w[redacted]d  6 lb (2.722 kg) F Vag-Spont EPI  LIV     Birth Comments: no complications      Past Surgical History: Past Surgical History:  Procedure Laterality Date  . DILATION AND CURETTAGE OF UTERUS  2010  . LAPAROSCOPIC TUBAL LIGATION  08/10/2011   Procedure: LAPAROSCOPIC TUBAL LIGATION;  Surgeon: Tereso Newcomer, MD;  Location: [facility];  Service: Gynecology;  Laterality: Bilateral;    Family History: Family History  Problem Relation Age of Onset  . Hypertension Father   . Hypertension Mother   . Anemia Mother   . Bipolar disorder Mother   . Thyroid disease Maternal Aunt   . Bipolar disorder Maternal Aunt   . Diabetes Maternal Uncle   . Diabetes Maternal Grandmother   . Diabetes  Paternal Grandmother   . Cancer Paternal Grandmother   . Anesthesia problems Neg Hx   . Hypotension Neg Hx   . Malignant hyperthermia Neg Hx   . Pseudochol deficiency Neg Hx     Social History: Social History  Substance Use Topics  . Smoking status: Former Smoker    Packs/day: 0.25    Years: 0.50    Types: Cigarettes    Quit date: 11/18/2011  . Smokeless tobacco: Never Used  . Alcohol use No    Allergies:  Allergies  Allergen Reactions  . Shellfish Allergy Swelling    Meds:  Prescriptions Prior to Admission  Medication Sig Dispense Refill Last Dose  . diphenhydrAMINE (BENADRYL) 25 mg capsule Take 25 mg by mouth daily.   Past Week at Unknown time  . guaiFENesin (ROBITUSSIN) 100 MG/5ML liquid Take 5-10 mLs (100-200 mg total) by mouth every 4 (four) hours as needed for cough. 60 mL 0   . hydrochlorothiazide (HYDRODIURIL) 25 MG tablet Take 1 tablet (25 mg total) by mouth daily. 30 tablet 1 Past Week at Unknown time  . levothyroxine (LEVOTHROID) 25 MCG tablet Take 1 tablet (25 mcg total) by mouth daily before breakfast. 30 tablet 0 Past Week at Unknown time  . ondansetron (ZOFRAN) 4 MG tablet Take 1 tablet (4 mg total) by mouth every 6 (six) hours. 12 tablet 0     I have reviewed patient's Past Medical Hx, Surgical Hx, Family Hx, Social Hx, medications and allergies.  ROS:  Review of Systems  Constitutional: Negative for fever.  Gastrointestinal: Positive for abdominal pain. Negative for constipation, diarrhea, nausea and vomiting.  Genitourinary: Positive for pelvic pain. Negative for dysuria and frequency.  Musculoskeletal: Positive for back pain. Negative for myalgias.  Neurological: Positive for tingling and numbness. Negative for weakness and headaches.   Other systems negative     Physical Exam  Patient Vitals for the past 24 hrs:  BP Temp Temp src Pulse Resp Height Weight  11/22/16 1151 (!) 153/111 - - 68 20 - -  11/22/16 1019 (!) 141/110 98.6 F (37 C) Oral 75  18 5\' 6"  (1.676 m) 212 lb (96.2 kg)   Constitutional: Well-developed, well-nourished female in no acute distress.  Cardiovascular: normal rate and rhythm, no ectopy audible, S1 & S2 heard, no murmur Respiratory: normal effort, no distress. Lungs CTAB with no wheezes or crackles GI: Abd soft, non-tender.  Nondistended.  No rebound, No guarding.  Bowel Sounds audible  MS: Extremities nontender, no edema, normal ROM Neurologic: Alert and oriented x 4.   Grossly nonfocal. GU: Neg CVAT. Skin:  Warm and Dry Psych:  Affect appropriate.  PELVIC EXAM: Cervix pink, visually closed, without lesion,  scant white creamy discharge, vaginal walls and external genitalia normal Bimanual exam: Cervix firm, anterior, neg CMT, uterus not tender, nonenlarged, adnexa with mild tenderness, enlargement, or mass    Labs:    Results for orders placed or performed during the hospital encounter of 11/22/16 (from the past 24 hour(s))  Urinalysis, Routine w reflex microscopic     Status: None   Collection Time: 11/22/16 10:25 AM  Result Value Ref Range   Color, Urine YELLOW YELLOW   APPearance CLEAR CLEAR   Specific Gravity, Urine 1.017 1.005 - 1.030   pH 6.0 5.0 - 8.0   Glucose, UA NEGATIVE NEGATIVE mg/dL   Hgb urine dipstick NEGATIVE NEGATIVE   Bilirubin Urine NEGATIVE NEGATIVE   Ketones, ur NEGATIVE NEGATIVE mg/dL   Protein, ur NEGATIVE NEGATIVE mg/dL   Nitrite NEGATIVE NEGATIVE   Leukocytes, UA NEGATIVE NEGATIVE  CBC with Differential/Platelet     Status: None   Collection Time: 11/22/16 11:07 AM  Result Value Ref Range   WBC 4.8 4.0 - 10.5 K/uL   RBC 4.12 3.87 - 5.11 MIL/uL   Hemoglobin 13.2 12.0 - 15.0 g/dL   HCT 16.138.8 09.636.0 - 04.546.0 %   MCV 94.2 78.0 - 100.0 fL   MCH 32.0 26.0 - 34.0 pg   MCHC 34.0 30.0 - 36.0 g/dL   RDW 40.913.2 81.111.5 - 91.415.5 %   Platelets 340 150 - 400 K/uL   Neutrophils Relative % 46 %   Neutro Abs 2.2 1.7 - 7.7 K/uL   Lymphocytes Relative 46 %   Lymphs Abs 2.2 0.7 - 4.0 K/uL    Monocytes Relative 6 %   Monocytes Absolute 0.3 0.1 - 1.0 K/uL   Eosinophils Relative 1 %   Eosinophils Absolute 0.1 0.0 - 0.7 K/uL   Basophils Relative 1 %   Basophils Absolute 0.0 0.0 - 0.1 K/uL  Comprehensive metabolic panel     Status: Abnormal   Collection Time: 11/22/16 11:07 AM  Result Value Ref Range   Sodium 138 135 - 145 mmol/L   Potassium 3.7 3.5 - 5.1 mmol/L   Chloride 103 101 - 111 mmol/L   CO2 29 22 - 32 mmol/L   Glucose, Bld 90 65 - 99 mg/dL   BUN 9 6 - 20 mg/dL   Creatinine, Ser 7.821.13 (H) 0.44 - 1.00 mg/dL   Calcium 9.2 8.9 - 95.610.3 mg/dL   Total Protein 8.3 (H) 6.5 - 8.1 g/dL   Albumin 4.5 3.5 - 5.0 g/dL   AST 26 15 - 41 U/L   ALT 18 14 - 54 U/L   Alkaline Phosphatase 58 38 - 126 U/L   Total Bilirubin 0.5 0.3 - 1.2 mg/dL   GFR calc non Af Amer >60 >60 mL/min   GFR calc Af Amer >60 >60 mL/min   Anion gap 6 5 - 15  Pregnancy, urine POC     Status: None   Collection Time: 11/22/16 11:28 AM  Result Value Ref Range   Preg Test, Ur NEGATIVE NEGATIVE  Wet prep, genital     Status: Abnormal   Collection Time: 11/22/16 11:57 AM  Result Value Ref Range   Yeast Wet Prep HPF POC NONE SEEN NONE SEEN   Trich, Wet Prep NONE SEEN NONE SEEN   Clue Cells Wet Prep HPF POC NONE SEEN NONE SEEN   WBC, Wet Prep HPF POC FEW (A) NONE SEEN   Sperm NONE SEEN      Imaging:  No results found.  MAU Course/MDM: I  have ordered labs as follows: see above Imaging ordered: outpatient [place]  Results reviewed. No leukocytosis noted.  Less concern for appendicitis.  Negative wet prep.  UA is negative.  Creatinine elevated at 1.13.  Discussed hypertension effect on kidneys.     WIll get [place] as outpatient. Will refer to [facility] for medical care.  Reordered HCTZ and Synthroid to [facility] pharmacy for $4 list.  Will rx Flexeril for back pain.  Suggest trying motrin for cramping.  Treatments in MAU included Toradol.   Pt stable at time of discharge.  Assessment: Acute bilateral low back pain  without sciatica - Plan: Discharge patient, US Pelvis Complete, US Transvaginal Non-OB  Pelvic pain - Plan: Discharge patient, US Pelvis Complete, US Transvaginal Non-OB  Dysmenorrhea - Plan: Discharge patient  Hypertension, unspecified type - Plan: Discharge patient  Elevated serum creatinine - Plan: Discharge patient  Does not have health insurance - Plan: Discharge patient  Non compliance w medication regimen - Due to cost - Plan: Discharge patient    Plan: Discharge home Recommend go to either [facility] or [facility] for care Rx sent for Flexeril  for back pain Rx resent for HCTZ and Synthroid at [employer] Will schedule outpatient [place] for evaluation of pelvic processes  Encouraged to return here or to other Urgent Care/ED if she develops worsening of symptoms, increase in pain, fever, or other concerning symptoms.   Wynelle Bourgeois CNM, MSN Certified Nurse-Midwife 11/22/2016 12:07 PM

## 2016-11-22 NOTE — Progress Notes (Signed)
Artelia LarocheM. Williams, CNM informed of pt's last BP elevation prior to discharge.  CNM states she spoke with attending regarding pt status and pt informed to take meds for ^BP and f/u @ clinic.

## 2016-11-23 LAB — GC/CHLAMYDIA PROBE AMP (~~LOC~~) NOT AT ARMC
Chlamydia: NEGATIVE
Neisseria Gonorrhea: NEGATIVE

## 2016-11-29 ENCOUNTER — Encounter: Payer: Self-pay | Admitting: Advanced Practice Midwife

## 2016-11-29 ENCOUNTER — Ambulatory Visit (HOSPITAL_COMMUNITY)
Admission: RE | Admit: 2016-11-29 | Discharge: 2016-11-29 | Disposition: A | Discharge status: Home / Self Care | Payer: Medicaid Other | Source: Ambulatory Visit | Attending: Advanced Practice Midwife | Admitting: Advanced Practice Midwife

## 2016-11-29 DIAGNOSIS — R102 Pelvic and perineal pain: Secondary | ICD-10-CM | POA: Insufficient documentation

## 2016-11-29 DIAGNOSIS — M545 Low back pain, unspecified: Secondary | ICD-10-CM

## 2016-12-01 ENCOUNTER — Encounter: Payer: Self-pay | Admitting: Internal Medicine

## 2016-12-03 ENCOUNTER — Encounter: Payer: Self-pay | Admitting: Internal Medicine

## 2016-12-06 ENCOUNTER — Telehealth: Payer: Self-pay | Admitting: Internal Medicine

## 2016-12-06 NOTE — Telephone Encounter (Signed)
Spoke with patient over the phone regarding our MyChart exchanges. She states she lost her insurance and she has been unable to come into the doctor's office or afford any of her medications. She was previously being treated for Bipolar disorder at [facility], but has not been there for over a year and cannot afford her psych medications. She states she is very overwhelmed with her financial difficulties. She wants to get better, but doesn't know how to make this happen. I have sent Sammuel Hines (CSW) a message, asking her to follow-up with patient by phone to discuss any community resources that may be helpful.  Willadean Carol, MD

## 2016-12-07 ENCOUNTER — Telehealth: Payer: Self-pay

## 2016-12-07 NOTE — Telephone Encounter (Signed)
LM for pt stating that the results that she had requested are normal and if she has any questions to please give the office a call.

## 2016-12-07 NOTE — Telephone Encounter (Signed)
-----   Message from Marti Sleigh, [place] sent at 12/02/2016  8:18 AM EDT ----- Contact: [phone] This patient had Pelvic US on 11/29/16.  Someone need to call her back for [place] results.

## 2016-12-08 ENCOUNTER — Telehealth: Payer: Self-pay | Admitting: Licensed Clinical Social Worker

## 2016-12-08 NOTE — Progress Notes (Signed)
Social work consult from Dr. Nancy Marus reference assessing patient for community resources.   Left message to call LCSW.  Plan: LCSW will call patient in 3 to 5 days if no return call is received.  Sammuel Hines, LCSW Licensed Clinical Social Worker [facility]   [phone] 11:44 AM

## 2016-12-10 NOTE — Progress Notes (Signed)
Social work consult from Dr. Nancy Marus reference assessing patient for community resources.   2nd voice message left for patient to call LCSW.  Plan: LCSW will wait for return call.  Sammuel Hines, LCSW Licensed Clinical Social Worker [facility]   [phone] 3:15 PM

## 2016-12-14 ENCOUNTER — Telehealth: Payer: Self-pay | Admitting: Licensed Clinical Social Worker

## 2016-12-14 NOTE — Progress Notes (Signed)
Social work consult from Dr. Nancy Marus reference assessing patient for community resources.    LCSW assessed patient for the above consult.  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  Patient pleasant and appreciative of f/u call.  LCSW reviewed [employer], discussed process and steps for going to [employer] and explained [employer].  Intervention: emotional support, reflective listening, Problem-solving teaching/coping strategies and Referral to [facility] provider    Plan:  1. Patient will go to [facility] tomorrow for walk-in intake  2. Patient will obtain denial letter from [facility] and call to schedule [facility] appointment 3. LCSW will contact patient in 3 to 5 days for an update.  Sammuel Hines, LCSW Licensed Clinical Social Worker [facility]   [phone] 11:02 AM

## 2016-12-28 ENCOUNTER — Telehealth: Payer: Self-pay | Admitting: Licensed Clinical Social Worker

## 2016-12-28 NOTE — Progress Notes (Addendum)
Integrated Care f/u phone call to patient reference resources provided.  Patient Informed LCSW she has completed all paper work for her [employer] Card with [facility] on [address].  She has decide to allow them to meet all of her medical and mental health needs as well as allow her to see specialist and assist with transporation if needed.  Per patient this will work out better for her and the children to go to the same provider.   Intervention: [employer] and Supportive Counseling   Discussed other community resources for assistance with disability and vocational employment.  Patient appreciative of the f/u and assistance. Update provided to PCP.  Plan: Patient will contact Vocational Rehabilitation for assistance with employment, once she is stable on her medication.    Sammuel Hineseborah Alyah Boehning, LCSW Licensed Clinical Social Worker [facility]   [phone] 9:52 AM

## 2017-02-23 ENCOUNTER — Encounter: Payer: Self-pay | Admitting: Internal Medicine

## 2017-02-23 ENCOUNTER — Other Ambulatory Visit: Payer: Self-pay | Admitting: Advanced Practice Midwife

## 2017-02-24 ENCOUNTER — Encounter: Payer: Self-pay | Admitting: Internal Medicine

## 2017-02-24 ENCOUNTER — Other Ambulatory Visit: Payer: Self-pay | Admitting: Internal Medicine

## 2017-02-24 MED ORDER — HYDROCHLOROTHIAZIDE 25 MG PO TABS: 25.0000 mg | ORAL_TABLET | Freq: Every day | ORAL | 0 refills | Status: DC

## 2017-07-28 ENCOUNTER — Encounter: Payer: Self-pay | Admitting: Licensed Clinical Social Worker

## 2017-07-28 ENCOUNTER — Ambulatory Visit (INDEPENDENT_AMBULATORY_CARE_PROVIDER_SITE_OTHER): Payer: Self-pay | Admitting: Internal Medicine

## 2017-07-28 ENCOUNTER — Encounter: Payer: Self-pay | Admitting: Internal Medicine

## 2017-07-28 ENCOUNTER — Other Ambulatory Visit: Payer: Self-pay

## 2017-07-28 VITALS — BP 140/100 | HR 75 | Temp 98.2°F | Wt 228.0 lb

## 2017-07-28 DIAGNOSIS — E039 Hypothyroidism, unspecified: Secondary | ICD-10-CM

## 2017-07-28 DIAGNOSIS — I10 Essential (primary) hypertension: Secondary | ICD-10-CM

## 2017-07-28 DIAGNOSIS — J4520 Mild intermittent asthma, uncomplicated: Secondary | ICD-10-CM

## 2017-07-28 DIAGNOSIS — F0630 Mood disorder due to known physiological condition, unspecified: Secondary | ICD-10-CM

## 2017-07-28 MED ORDER — ALBUTEROL SULFATE HFA 108 (90 BASE) MCG/ACT IN AERS: 2.0000 | INHALATION_SPRAY | Freq: Four times a day (QID) | RESPIRATORY_TRACT | 0 refills | Status: DC | PRN

## 2017-07-28 MED ORDER — HYDROCHLOROTHIAZIDE 25 MG PO TABS: 25.0000 mg | ORAL_TABLET | Freq: Every day | ORAL | 2 refills | Status: DC

## 2017-07-28 NOTE — Progress Notes (Signed)
   [facility] Phone: [phone]  Subjective:  [name] is a [age] year old female presenting to clinic to discuss her mood disorder, hypothyroidism, asthma, and HTN.  Mood Disorder: States she has been very irritable recently. This has caused her to lose some jobs because she gets too angry with customers. Also states that she has been hostile in her relationship. She states she feels depressed. She has a lot of stress at home with raising 4 children. [facility] the past and was previously on Seroquel, but stopped taking this because it made her too sleepy.  Hypothyroidism: Stopped taking her Synthroid in November. Last TSH 09/2015 was 74. Endorses fatigue and dry skin. Thinks her uncontrolled hypothyroidism may be contributing to her depression. Denies constipation.  Asthma: Endorses worsening shortness of breath over the last 2 weeks since having a cold. Has had to use her child's Albuterol nebulizer. Has not had her own Albuterol inhaler in quite some time. Notes shortness of breath with both walking and at rest. Also notes some chest "tightness" and occasional cough. No nighttime cough. She has not been hospitalized or had any ED visits in the last year.  HTN: Does not check her BPs at home. Previously taking HCTZ 25mg  daily, but stopped taking this. Has never had any side effects to the HCTZ, besides that it causes her to urinate a lot. No lower extremity edema, no dizziness.   ROS: See HPI for pertinent positives and negatives  Past Medical History- HTN, asthma, hypothyroidism, ADHD, mood disorder/depression  Family history reviewed for today's visit. No changes.  Social history- patient is a former smoker, quit in 2013.  Objective: BP (!) 140/100   Pulse 75   Temp 98.2 F (36.8 C) (Oral)   Wt 228 lb (103.4 kg)   LMP 07/25/2017   SpO2 99%   BMI 36.80 kg/m  Gen: NAD, alert, cooperative with exam HEENT: NCAT, EOMI, MMM Neck: FROM,  supple CV: RRR, no murmur Resp: Decreased air movement throughout all lung fields but lungs otherwise clear, no wheezes, normal work of breathing Msk: No edema, warm, normal tone, moves UE/LE spontaneously Neuro: Alert and oriented, no gross deficits Skin: No rashes, no lesions Psych: Tangential speech, increased rate of speech, occasionally becomes tearful.  PHQ-9: 20  Assessment/Plan: Mood Disorder: Has depression listed on her problem list, but wonder if she may have undiagnosed bipolar disorder. [facility] the past and was on Seroquel previously. - Behavioral Health Consultant Sammuel Hines(Deborah Moore, CSW) saw patient during this visit, greatly appreciate her assistance. - Patient given information for referral to [facility] for further evaluation and treatment  Hypothyroidism: TSH not checked since 2017, since patient has not been seen in our clinic in a few years. Has been off her Synthroid since November. - Check TSH and T4  Asthma: Uncontrolled. Having shortness of breath and cough. Has not had her own Albuterol in a few months. - Albuterol refilled - May need to consider getting PFTs once she gets insurance - Follow-up in 4 weeks. May need to consider adding daily inhaled corticosteroid.  HTN: Uncontrolled. BP 140/110. Has been off her HCTZ since November 2018. - Restart HCTZ 25mg  daily - Follow-up in 4 weeks   Willadean CarolKaty Hyden Soley, MD PGY-3

## 2017-07-28 NOTE — Assessment & Plan Note (Signed)
Has depression listed on her problem list, but wonder if she may have undiagnosed bipolar disorder. [facility] and was on Seroquel previously. - Behavioral Health Consultant Sammuel Hines(Deborah Moore, CSW) saw patient during this visit, greatly appreciate her assistance. - Patient given information [facility] for further evaluation and treatment

## 2017-07-28 NOTE — Patient Instructions (Signed)
It was so nice to see you today!  For your thyroid- I have checked some thyroid labs today. I will call you with these results. We will probably need to restart your Synthroid.  For your blood pressure- I have restarted your Hydrochlorothiazide. Please take 1 tablet daily.  For your breathing- I have sent in a new prescription for Albuterol. We may need to have you do lung function testing in the future. This can be done at our clinic.  For your mood- Behavioral health has seen you today.  Please come back to see me in 4 weeks!  -Dr. Nancy MarusMayo

## 2017-07-28 NOTE — Progress Notes (Signed)
ESTIMATE TIME:20 minutes Type of Service: Integrated Behavioral Health warm handoff  Interpreter:No.    [name] is a 32 y.o. female referred by Dr. Nancy MarusMayo for mood concerns, symptoms of depression a well as stressors.  Patient is pleasant and engaged in conversation. Also reports difficulty remembering things, crying for no reason, getting angry, then happy.. Duration of problem: on going Impact: not able to maintain a job ; problems with family;  Current /Hx of mental health treatment & substance use: denies drinking alcohol; smokes cannabis daily; states [facility] but has been off of medication for a while. Patient was not taking medication for thyroid at the [facility].  Patient positive for SI thoughts with no plan. No hx of SI, indicated unlikely that she would act on thoughts due to needing to care for her children.   LIFE /SOCIAL :  patient lives with female friend and children ,does not work;  Recent Life changes: none reported GOALS: Patient will reduce symptoms of: depression and mood instability, and increase  ability [id number]:coping skills and self-management skills, . INTERVENTION:  Supportive Counseling, Reflective listening, Behavioral Therapy (Relaxed breathing), Screening Tool(s)  Administered, Psychoeducation and [facility]erral [facility] ; community resources.   Depression screen [facility] 2/9 07/28/2017 07/08/2014 06/28/2014  Decreased Interest 2 2 0  Down, Depressed, Hopeless 1 2 0  PHQ - 2 Score 3 4 0  Altered sleeping 3 3 -  Tired, decreased energy 3 2 -  Change in appetite 3 3 -  Feeling bad or failure about yourself  3 2 -  Trouble concentrating 2 2 -  Moving slowly or fidgety/restless 2 1 -  Suicidal thoughts 1 0 -  PHQ-9 Score 20 17 -   ISSUES DISCUSSED: Integrated care services, support system, previous and current coping skills; review of safety plan when pt has SI;  community resources , things patient enjoy or  use to enjoy doing, her experience [facility] and willingness for ongoing mental health treatment.    ASSESSMENT:Patient currently experiencing symptoms of depression and psychosocial stressor.  Symptoms exacerbated by chronic medical conditions and being off of medications. Patient may benefit from, and is in agreement to receive further assessment and brief therapeutic interventions to assist with managing her symtoms. Patient will wait prior to [facility] until she talks to PCP about lab results from thyroid.  PLAN:   1.LCSW will F/U with phone call in 5 to 7 days  2. Behavioral recommendations: relaxed breathing  3. Referral:*Community Mental Health Services (LME/Outside Clinic) after labs results come back and talk with PCP   Warm Hand Off Completed.     Sammuel Hineseborah Elycia Woodside, LCSW Licensed Clinical Social Worker [facility]   [phone] 12:03 PM

## 2017-07-28 NOTE — Assessment & Plan Note (Signed)
Uncontrolled. BP 140/110. Has been off her HCTZ since November 2018. - Restart HCTZ 25mg  daily - Follow-up in 4 weeks

## 2017-07-28 NOTE — Assessment & Plan Note (Signed)
TSH not checked since 2017, since patient has not been seen in our clinic in a few years. Has been off her Synthroid since November. - Check TSH and T4

## 2017-07-28 NOTE — Assessment & Plan Note (Signed)
Uncontrolled. Having shortness of breath and cough. Has not had her own Albuterol in a few months. - Albuterol refilled - May need to consider getting PFTs once she gets insurance - Follow-up in 4 weeks. May need to consider adding daily inhaled corticosteroid.

## 2017-07-29 LAB — T4, FREE: Free T4: 0.18 ng/dL — ABNORMAL LOW (ref 0.82–1.77)

## 2017-07-29 LAB — TSH: TSH: 50.46 u[IU]/mL — AB (ref 0.450–4.500)

## 2017-08-01 ENCOUNTER — Encounter: Payer: Self-pay | Admitting: Internal Medicine

## 2017-08-01 ENCOUNTER — Other Ambulatory Visit: Payer: Self-pay | Admitting: Internal Medicine

## 2017-08-01 DIAGNOSIS — E039 Hypothyroidism, unspecified: Secondary | ICD-10-CM

## 2017-08-01 MED ORDER — LEVOTHYROXINE SODIUM 25 MCG PO TABS: ORAL_TABLET | ORAL | 0 refills | Status: DC

## 2017-08-01 NOTE — Progress Notes (Signed)
TSH was 50 and T4 was 0.18 at most recent visit. Will restart Synthroid at 25 mcg daily. Future orders placed to have TSH and T4 rechecked in 4 weeks. Called patient to discuss the plan but she did not answer and does not have a voicemail box set up. Sent her a message via MyChart.  Willadean CarolKaty Dominika Losey, MD PGY-3

## 2017-08-03 ENCOUNTER — Telehealth: Payer: Self-pay | Admitting: Licensed Clinical Social Worker

## 2017-08-03 NOTE — Progress Notes (Signed)
Service : Integrated Behavioral Health F/U Call   F/U call to patient reference interventions discussed and resources provided during warm handoff from PCP.  Unable to leave message for patient. Reached out to PCP to contact patient via My Chart.  Sammuel Hineseborah Moore, LCSW Licensed Clinical Social Worker [facility]   [phone] 3:34 PM

## 2017-08-31 ENCOUNTER — Ambulatory Visit: Payer: Self-pay

## 2018-01-09 ENCOUNTER — Other Ambulatory Visit: Payer: Self-pay

## 2018-01-09 MED ORDER — LEVOTHYROXINE SODIUM 25 MCG PO TABS: ORAL_TABLET | ORAL | 0 refills | Status: DC

## 2018-01-09 NOTE — Telephone Encounter (Signed)
I will refill this order but please call pt to make sure she knows to come in to have labs drawn to ensure she is appropriately treated. Dr. Nancy MarusMayo wanted to draw labs in April but [name] was hard to reach at that time and may not know.  The orders are in, she only needs to come in for a lab drawn.  Thank you,  Carrie MoPeter Treylan Mcclintock, MD

## 2018-01-10 NOTE — Telephone Encounter (Signed)
Tried calling patient but call could not be completed at this time.  Will send her a mychart message asking her to contact the office to schedule a lab appointment.  Jazmin Hartsell,CMA

## 2018-05-09 ENCOUNTER — Ambulatory Visit: Payer: Self-pay | Admitting: Family Medicine

## 2018-05-09 NOTE — Progress Notes (Deleted)
   Subjective:    Patient ID: [name], female    DOB: 01-14-1986, 33 y.o.   MRN: [id number]   CC:  HPI:   Smoking status reviewed  Review of Systems   Objective:  There were no vitals taken for this visit. Vitals and nursing note reviewed  General: well nourished, in no acute distress HEENT: normocephalic, TM's visualized bilaterally, no scleral icterus or conjunctival pallor, no nasal discharge, moist mucous membranes, good dentition without erythema or discharge noted in posterior oropharynx Neck: supple, non-tender, without lymphadenopathy Cardiac: RRR, clear S1 and S2, no murmurs, rubs, or gallops Respiratory: clear to auscultation bilaterally, no increased work of breathing Abdomen: soft, nontender, nondistended, no masses or organomegaly. Bowel sounds present Extremities: no edema or cyanosis. Warm, well perfused. 2+ radial and PT pulses bilaterally Skin: warm and dry, no rashes noted Neuro: alert and oriented, no focal deficits   Assessment & Plan:    No problem-specific Assessment & Plan notes found for this encounter.    No follow-ups on file.   Dr. Peggyann Shoals [facility], PGY-1

## 2018-07-05 ENCOUNTER — Telehealth: Payer: Self-pay | Admitting: Family

## 2018-07-05 DIAGNOSIS — R079 Chest pain, unspecified: Secondary | ICD-10-CM

## 2018-07-05 NOTE — Progress Notes (Signed)
Based on what you shared with me, I feel your condition warrants further evaluation and I recommend that you be seen for a face to face office visit.   NOTE: If you entered your credit card information for this eVisit, you will not be charged. You may see a "hold" on your card for the $35 but that hold will drop off and you will not have a charge processed.  If you are having a true medical emergency please call 911.  If you need an urgent face to face visit, [facility] has four urgent care centers for your convenience.  If you need care fast and have a high deductible or no insurance consider:   [email or web address] to reserve your spot online an avoid wait times  [facility] [address], [address] [place], [place] [address] 8 am to 8 pm Monday-Friday 10 am to 4 pm Saturday-Sunday *Across the street from [employer]  [facility]  [address] [place], [address] 8 am to 5 pm Monday-Friday * In the [facility] the [facility]   The following sites will take your insurance:  . [facility]  [phone] Get Driving Directions Find a Provider at this Location  [address] [place], [place] [address] . 10 am to 8 pm Monday-Friday . 12 pm to 8 pm Saturday-Sunday   . [facility]  [phone] Get Driving Directions Find a Provider at this Location  [address], [address] [place], [place] [address] . 8 am to 8 pm Monday-Friday . 9 am to 6 pm Saturday . 11 am to 6 pm Sunday   . [facility] [place]  [phone] Get Driving Directions  [address] [place],  [address] . 8 am to 8 pm Monday-Friday . 8 am to 4 pm Saturday-Sunday   Your e-visit answers were reviewed by a board certified advanced clinical practitioner to complete your personal care plan.  Thank you for using e-Visits.  

## 2018-07-06 ENCOUNTER — Other Ambulatory Visit: Payer: Self-pay

## 2018-07-06 ENCOUNTER — Telehealth: Payer: Self-pay | Admitting: Family Medicine

## 2018-07-06 ENCOUNTER — Ambulatory Visit (INDEPENDENT_AMBULATORY_CARE_PROVIDER_SITE_OTHER): Payer: Self-pay | Admitting: Family Medicine

## 2018-07-06 VITALS — BP 152/118 | HR 81 | Temp 98.8°F | Ht 66.0 in | Wt 221.2 lb

## 2018-07-06 DIAGNOSIS — J4520 Mild intermittent asthma, uncomplicated: Secondary | ICD-10-CM

## 2018-07-06 DIAGNOSIS — I10 Essential (primary) hypertension: Secondary | ICD-10-CM

## 2018-07-06 DIAGNOSIS — E039 Hypothyroidism, unspecified: Secondary | ICD-10-CM

## 2018-07-06 MED ORDER — ALBUTEROL SULFATE HFA 108 (90 BASE) MCG/ACT IN AERS: 2.0000 | INHALATION_SPRAY | Freq: Four times a day (QID) | RESPIRATORY_TRACT | 0 refills | Status: DC | PRN

## 2018-07-06 MED ORDER — LEVOTHYROXINE SODIUM 75 MCG PO TABS: 75.0000 ug | ORAL_TABLET | Freq: Every day | ORAL | 3 refills | Status: DC

## 2018-07-06 MED ORDER — HYDROCHLOROTHIAZIDE 25 MG PO TABS: 25.0000 mg | ORAL_TABLET | Freq: Every day | ORAL | 3 refills | Status: DC

## 2018-07-06 NOTE — Telephone Encounter (Signed)
Patient called this AM because she is worried she is sick with coronavirus.  Therefore phone call forwarded back to preceptor for the morning.  Also has known seasonal allergies.  She is unsure whether her symptoms are due to illness or allergies.     Patient tells me she has been without any of her medicines for almost a year, if not longer.  Has known history of asthma, HTN, hypothyroidism.  Mentions specifically no thyroid or asthma medications for some time.  Has had increasing cough for past several weeks.  Dry hacking at first, now productive of thick yellow or green sputum.  Ongoing sinus congestion and drainage.  Cough worse at night.    NO fevers or chills.  No recent travel.  No myalgias.  She is worried because she has 4 kids at home and doesn't want to get them sick.    My concern is for an asthma exacerbation perhaps triggered by URI or sinus infection.  No concern for flu or covid.  Scheduled for access to care this AM at 9:50.

## 2018-07-06 NOTE — Assessment & Plan Note (Signed)
Cough may be related to this or viral URI.  Restart albuterol as needed.  May need controller in the future.

## 2018-07-06 NOTE — Progress Notes (Signed)
   CC: thyroid, cough, HTN  HPI  Cough - she has hx of asthma first dx in middle school. Coughing up mucous. Previously on albuterol, she thinks maybe advair. Been out of those for 1 year. She has been using her son's albuterol. Cough started Sunday, also noticed some muscle pain. No exercise. More cough after walking up stairs. No fever, she has checked her temperature. Speaks in long sentences.   HTN - been up since 2004 after her first child. 2007 with second child she had PreE, no ecclampsia. Last on HCTZ for BP. None for a year.   She is worried her thyroid is flared up. She says this was first found at [facility] and she "almost died." last check was TSH 50, T4 0.18. been off thyroid meds for 1 year. Weight is steadily going up, usually 155.   Birth control - no sex. Got Essure in 2016.   ROS: Denies CP, SOB, abdominal pain, dysuria, changes in BMs.   CC, SH/smoking status, and VS noted  Objective: BP (!) 152/118   Pulse 81   Temp 98.8 F (37.1 C) (Oral)   Ht 5\' 6"  (1.676 m)   Wt 221 lb 3.2 oz (100.3 kg)   LMP 06/07/2018 (Approximate)   SpO2 97%   BMI 35.70 kg/m  Gen: NAD, alert, cooperative, and pleasant. HEENT: NCAT, EOMI, PERRL, no thyromegaly or exophthalmos, TMs and oropharynx clear CV: RRR, no murmur Resp: CTAB, no wheezes, non-labored Ext: No edema, warm Neuro: Alert and oriented, Speech clear, No gross deficits  Assessment and plan:  Hypertension, uncontrolled Restart HCTZ, recheck BMP with labs in 6 weeks.  Asthma Cough may be related to this or viral URI.  Restart albuterol as needed.  May need controller in the future.  Hypothyroidism History of poor control.  Has been off her levothyroxine for 1 year.  Given last TSH was 50, will restart with 75 mcg of levothyroxine, recheck TSH in 6 weeks.   Orders Placed This Encounter  Procedures  . TSH  . CBC  . Basic metabolic panel    Meds ordered this encounter  Medications  . hydrochlorothiazide  (HYDRODIURIL) 25 MG tablet    Sig: Take 1 tablet (25 mg total) by mouth daily.    Dispense:  90 tablet    Refill:  3  . levothyroxine (SYNTHROID, LEVOTHROID) 75 MCG tablet    Sig: Take 1 tablet (75 mcg total) by mouth daily.    Dispense:  90 tablet    Refill:  3  . albuterol (PROVENTIL HFA;VENTOLIN HFA) 108 (90 Base) MCG/ACT inhaler    Sig: Inhale 2 puffs into the lungs every 6 (six) hours as needed for wheezing or shortness of breath.    Dispense:  1 Inhaler    Refill:  0    Loni Muse, MD, PGY3 07/06/2018 11:01 AM

## 2018-07-06 NOTE — Assessment & Plan Note (Signed)
History of poor control.  Has been off her levothyroxine for 1 year.  Given last TSH was 50, will restart with 75 mcg of levothyroxine, recheck TSH in 6 weeks.

## 2018-07-06 NOTE — Patient Instructions (Signed)
It was a pleasure to see you today! Thank you for choosing [facility] for your primary care. [name] was seen for thyroid, cough, blood pressure.   Our plans for today were:  Start a thyroid medicine that I sent, we need to recheck your thyroid hormone level in 6 weeks.  Start the blood pressure medicine that I sent.  Check your blood pressure at the pharmacy if you are able.  For your cough, you can use your albuterol as needed, but you had good lung sounds today and no wheezing.  Best,  Dr. Chanetta Marshall

## 2018-07-06 NOTE — Assessment & Plan Note (Signed)
Restart HCTZ, recheck BMP with labs in 6 weeks.

## 2018-07-07 LAB — BASIC METABOLIC PANEL
BUN / CREAT RATIO: 12 (ref 9–23)
BUN: 12 mg/dL (ref 6–20)
CO2: 21 mmol/L (ref 20–29)
CREATININE: 1.01 mg/dL — AB (ref 0.57–1.00)
Calcium: 9.6 mg/dL (ref 8.7–10.2)
Chloride: 104 mmol/L (ref 96–106)
GFR, EST AFRICAN AMERICAN: 86 mL/min/{1.73_m2} (ref >59)
GFR, EST NON AFRICAN AMERICAN: 74 mL/min/{1.73_m2} (ref >59)
Glucose: 89 mg/dL (ref 65–99)
Potassium: 4.1 mmol/L (ref 3.5–5.2)
SODIUM: 141 mmol/L (ref 134–144)

## 2018-07-07 LAB — CBC
HEMOGLOBIN: 13.1 g/dL (ref 11.1–15.9)
Hematocrit: 39.1 % (ref 34.0–46.6)
MCH: 32.1 pg (ref 26.6–33.0)
MCHC: 33.5 g/dL (ref 31.5–35.7)
MCV: 96 fL (ref 79–97)
Platelets: 363 10*3/uL (ref 150–450)
RBC: 4.08 x10E6/uL (ref 3.77–5.28)
RDW: 13.1 % (ref 11.7–15.4)
WBC: 8.1 10*3/uL (ref 3.4–10.8)

## 2018-07-07 LAB — TSH: TSH: 68.97 u[IU]/mL — ABNORMAL HIGH (ref 0.450–4.500)

## 2018-08-31 ENCOUNTER — Ambulatory Visit: Payer: Self-pay | Admitting: Family Medicine

## 2018-09-01 ENCOUNTER — Ambulatory Visit: Payer: Self-pay | Admitting: Family Medicine

## 2018-09-01 NOTE — Progress Notes (Signed)
Greater than 5 minutes, yet less than 10 minutes of time have been spent researching, coordinating, and implementing care for this patient today.  Thank you for the details you included in the comment boxes. Those details are very helpful in determining the best course of treatment for you and help us to provide the best care.  

## 2019-04-26 ENCOUNTER — Ambulatory Visit: Payer: Self-pay | Attending: Internal Medicine

## 2019-08-09 NOTE — Progress Notes (Signed)
SUBJECTIVE:   CHIEF COMPLAINT / HPI:   Physical: patient due for pap smear.  Denies any concerns with vaginal discharge, pelvic pain.  Menstrual cycle just ended.  Would also like to be tested for STIs.  Has been with same partner for past 18 years.  Check thyroid: patient with history of hypothyroidism, currently on levothyroxine 75 mcg daily.  Patient has a history of postpartum thyroiditis (2012).  Since then, TSH has been outside of normal range.  Last time TSH was 67 when checked March 2020.  Patient states she will take the levothyroxine every morning, however takes it with other medications (for example her blood pressure medication).  She also states that after a few days of taking the medication her body starts to feel very achy, which then causes her to stop taking the medication.  She reports she has been gaining weight although she has a decreased appetite.  No report of hair changes, does report decreased sex drive.  Stress: Patient reports that she has been having a lot of stress/anxiety recently.  PHQ-9 score today 13.  Denies any SI/HI.  States some days she is very happy, full of energy, then other days she needs to retreat to be by herself, does not want to be around other people.  Reports that she also is an only child and did not have parents that were very physically affectionate.  She feels like something is wrong with her.  Has also been suffering a lot from isolation from Covid, although other days she enjoys being away from other people.  PERTINENT  PMH / PSH:  Patient Active Problem List   Diagnosis Date Noted  . Well woman exam with routine gynecological exam 08/10/2019  . Depression 08/18/2014  . 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 Seasonal allergies 07/08/2014  . Hypothyroidism 07/03/2014  . 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 Hypertension, uncontrolled 07/06/2012  . 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 Asthma 12/16/2010      OBJECTIVE:   BP (!) 142/98   Pulse 100   Ht 5\' 6"  (1.676 m)   Wt 240 lb 6.4 oz (109 kg)   LMP 08/07/2019 (Exact Date)   SpO2 100%   BMI 38.80 kg/m    Physical exam: General: Well appearing, nontoxic, no apparent distress Resp: CTA bilaterally, speaking complete sentences Cardiac: RRR, S1-S2 present, no murmurs appreciated GU: Normal-appearing external vaginal labia majora and minora, scant thin white vaginal discharge appreciated, no lesions appreciated to cervix, no pain to palpation, normal appearing vaginal rugae and anatomy.   ASSESSMENT/PLAN:   Well woman exam with routine gynecological exam Well woman exam performed today.  Normal findings appreciated on physical exam. -We will contact patient with results of Pap smear, wet prep, STI check.  Mood disorder in conditions classified elsewhere Patient reporting difficulties with her mood.  PHQ-9 score today 13, denies any SI/HI.  States she is quick to be short tempered with other people, also feeling stressed, anxious.  Patient is not currently being treated, does not currently have a counselor/therapist. 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  Patient states that she takes her medicine every morning with other meds, her medicine also makes her feel achy after several days of taking it so she stopped taking it. -Continue patient on 75 mcg levothyroxine for now -Reach out to pharmacy to switch Synthroid prescription/manufacture -Thyroid panel ordered today -  Follow-up 3 months at the latest     [place]

## 2019-08-10 ENCOUNTER — Ambulatory Visit (INDEPENDENT_AMBULATORY_CARE_PROVIDER_SITE_OTHER): Payer: Medicaid Other | Admitting: Family Medicine

## 2019-08-10 ENCOUNTER — Other Ambulatory Visit: Payer: Self-pay

## 2019-08-10 ENCOUNTER — Encounter: Payer: Self-pay | Admitting: Family Medicine

## 2019-08-10 ENCOUNTER — Other Ambulatory Visit (HOSPITAL_COMMUNITY)
Admission: RE | Admit: 2019-08-10 | Discharge: 2019-08-10 | Disposition: A | Discharge status: Home / Self Care | Payer: Medicaid Other | Source: Ambulatory Visit | Attending: Family Medicine | Admitting: Family Medicine

## 2019-08-10 VITALS — BP 142/98 | HR 100 | Ht 66.0 in | Wt 240.4 lb

## 2019-08-10 DIAGNOSIS — Z01419 Encounter for gynecological examination (general) (routine) without abnormal findings: Secondary | ICD-10-CM

## 2019-08-10 DIAGNOSIS — E039 Hypothyroidism, unspecified: Secondary | ICD-10-CM | POA: Diagnosis present

## 2019-08-10 DIAGNOSIS — F0630 Mood disorder due to known physiological condition, unspecified: Secondary | ICD-10-CM | POA: Diagnosis not present

## 2019-08-10 DIAGNOSIS — J302 Other seasonal allergic rhinitis: Secondary | ICD-10-CM | POA: Diagnosis not present

## 2019-08-10 HISTORY — DX: Encounter for gynecological examination (general) (routine) without abnormal findings: Z01.419

## 2019-08-10 LAB — POCT WET PREP (WET MOUNT)
Clue Cells Wet Prep Whiff POC: NEGATIVE
Trichomonas Wet Prep HPF POC: ABSENT

## 2019-08-10 MED ORDER — CETIRIZINE HCL 10 MG PO TABS
10.0000 mg | ORAL_TABLET | Freq: Every day | ORAL | 11 refills | Status: DC
Start: 2019-08-10 — End: 2020-08-13

## 2019-08-10 NOTE — Assessment & Plan Note (Addendum)
Patient reporting difficulties with her mood.  PHQ-9 score today 13, denies any SI/HI.  States she is quick to be short tempered with other people, also feeling stressed, anxious.  Patient is not currently being treated, does not currently have a counselor/therapist. -Referral placed to behavioral health/psychology

## 2019-08-10 NOTE — Assessment & Plan Note (Signed)
Well woman exam performed today.  Normal findings appreciated on physical exam. -We will contact patient with results of Pap smear, wet prep, STI check.

## 2019-08-10 NOTE — Patient Instructions (Signed)
Thank you for coming in to see [place] today! Please see below to review our plan for today's visit:  1. Don't go longer than 5 hours every day without eating something! Stay away from juice (it's liquid sugar), and if you have to drink a soda go for the diet option. 2. Take your Thyroid medicine every morning at the same time WITHOUT any other medications to improve absorption. I will ask the Pharmacist about an alternative to the Thyroid Medication you're on. 3. Please look out for phone calls from numbers you don't recognize - this could be the [facility] referral people calling you back to set up an appt!  Please call the clinic at [phone] if your symptoms worsen or you have any concerns. It was our pleasure to serve you!  Dr. Milus Banister [facility]   DASH Eating Plan DASH stands for "Dietary Approaches to Stop Hypertension." The DASH eating plan is a healthy eating plan that has been shown to reduce high blood pressure (hypertension). It may also reduce your risk for type 2 diabetes, heart disease, and stroke. The DASH eating plan may also help with weight loss. What are tips for following this plan?  General guidelines  Avoid eating more than 2,300 mg (milligrams) of salt (sodium) a day. If you have hypertension, you may need to reduce your sodium intake to 1,500 mg a day.  Limit alcohol intake to no more than 1 drink a day for nonpregnant women and 2 drinks a day for men. One drink equals 12 oz of beer, 5 oz of wine, or 1 oz of hard liquor.  Work with your health care provider to maintain a healthy body weight or to lose weight. Ask what an ideal weight is for you.  Get at least 30 minutes of exercise that causes your heart to beat faster (aerobic exercise) most days of the week. Activities may include walking, swimming, or biking.  Work with your health care provider or diet and nutrition specialist (dietitian) to adjust your eating plan to your  individual calorie needs. Reading food labels   Check food labels for the amount of sodium per serving. Choose foods with less than 5 percent of the Daily Value of sodium. Generally, foods with less than 300 mg of sodium per serving fit into this eating plan.  To find whole grains, look for the word "whole" as the first word in the ingredient list. Shopping  Buy products labeled as "low-sodium" or "no salt added."  Buy fresh foods. Avoid canned foods and premade or frozen meals. Cooking  Avoid adding salt when cooking. Use salt-free seasonings or herbs instead of table salt or sea salt. Check with your health care provider or pharmacist before using salt substitutes.  Do not fry foods. Cook foods using healthy methods such as baking, boiling, grilling, and broiling instead.  Cook with heart-healthy oils, such as olive, canola, soybean, or sunflower oil. Meal planning  Eat a balanced diet that includes: ? 5 or more servings of fruits and vegetables each day. At each meal, try to fill half of your plate with fruits and vegetables. ? Up to 6-8 servings of whole grains each day. ? Less than 6 oz of lean meat, poultry, or fish each day. A 3-oz serving of meat is about the same size as a deck of cards. One egg equals 1 oz. ? 2 servings of low-fat dairy each day. ? A serving of nuts, seeds, or beans 5 times  each week. ? Heart-healthy fats. Healthy fats called Omega-3 fatty acids are found in foods such as flaxseeds and coldwater fish, like sardines, salmon, and mackerel.  Limit how much you eat of the following: ? Canned or prepackaged foods. ? Food that is high in trans fat, such as fried foods. ? Food that is high in saturated fat, such as fatty meat. ? Sweets, desserts, sugary drinks, and other foods with added sugar. ? Full-fat dairy products.  Do not salt foods before eating.  Try to eat at least 2 vegetarian meals each week.  Eat more home-cooked food and less restaurant,  buffet, and fast food.  When eating at a restaurant, ask that your food be prepared with less salt or no salt, if possible. What foods are recommended? The items listed may not be a complete list. Talk with your dietitian about what dietary choices are best for you. Grains Whole-grain or whole-wheat bread. Whole-grain or whole-wheat pasta. Brown rice. [place]. Bulgur. Whole-grain and low-sodium cereals. Pita bread. Low-fat, low-sodium crackers. Whole-wheat flour tortillas. Vegetables Fresh or frozen vegetables (raw, steamed, roasted, or grilled). Low-sodium or reduced-sodium tomato and vegetable juice. Low-sodium or reduced-sodium tomato sauce and tomato paste. Low-sodium or reduced-sodium canned vegetables. Fruits All fresh, dried, or frozen fruit. Canned fruit in natural juice (without added sugar). Meat and other protein foods Skinless chicken or [place]. Ground chicken or [place]. Pork with fat trimmed off. Fish and seafood. Egg whites. Dried beans, peas, or lentils. Unsalted nuts, nut butters, and seeds. Unsalted canned beans. Lean cuts of beef with fat trimmed off. Low-sodium, lean deli meat. Dairy Low-fat (1%) or fat-free (skim) milk. Fat-free, low-fat, or reduced-fat cheeses. Nonfat, low-sodium ricotta or cottage cheese. Low-fat or nonfat yogurt. Low-fat, low-sodium cheese. Fats and oils Soft margarine without trans fats. Vegetable oil. Low-fat, reduced-fat, or light mayonnaise and salad dressings (reduced-sodium). Canola, safflower, olive, soybean, and sunflower oils. Avocado. Seasoning and other foods Herbs. Spices. Seasoning mixes without salt. Unsalted popcorn and pretzels. Fat-free sweets. What foods are not recommended? The items listed may not be a complete list. Talk with your dietitian about what dietary choices are best for you. Grains Baked goods made with fat, such as croissants, muffins, or some breads. Dry pasta or rice meal packs. Vegetables Creamed or fried  vegetables. Vegetables in a cheese sauce. Regular canned vegetables (not low-sodium or reduced-sodium). Regular canned tomato sauce and paste (not low-sodium or reduced-sodium). Regular tomato and vegetable juice (not low-sodium or reduced-sodium). Angie Fava. Olives. Fruits Canned fruit in a light or heavy syrup. Fried fruit. Fruit in cream or butter sauce. Meat and other protein foods Fatty cuts of meat. Ribs. Fried meat. Berniece Salines. Sausage. Bologna and other processed lunch meats. Salami. Fatback. Hotdogs. Bratwurst. Salted nuts and seeds. Canned beans with added salt. Canned or smoked fish. Whole eggs or egg yolks. Chicken or [place] with skin. Dairy Whole or 2% milk, cream, and half-and-half. Whole or full-fat cream cheese. Whole-fat or sweetened yogurt. Full-fat cheese. Nondairy creamers. Whipped toppings. Processed cheese and cheese spreads. Fats and oils Butter. Stick margarine. Lard. Shortening. Ghee. Bacon fat. Tropical oils, such as coconut, palm kernel, or palm oil. Seasoning and other foods Salted popcorn and pretzels. Onion salt, garlic salt, seasoned salt, table salt, and sea salt. Worcestershire sauce. Tartar sauce. Barbecue sauce. Teriyaki sauce. Soy sauce, including reduced-sodium. Steak sauce. Canned and packaged gravies. Fish sauce. Oyster sauce. Cocktail sauce. Horseradish that you find on the shelf. Ketchup. Mustard. Meat flavorings and tenderizers. Bouillon cubes. Hot sauce  and Tabasco sauce. Premade or packaged marinades. Premade or packaged taco seasonings. Relishes. Regular salad dressings. Where to find more information:  National Heart, Lung, and Blood Institute: PopSteam.is  American Heart Association: www.heart.org Summary  The DASH eating plan is a healthy eating plan that has been shown to reduce high blood pressure (hypertension). It may also reduce your risk for type 2 diabetes, heart disease, and stroke.  With the DASH eating plan, you should limit salt (sodium)  intake to 2,300 mg a day. If you have hypertension, you may need to reduce your sodium intake to 1,500 mg a day.  When on the DASH eating plan, aim to eat more fresh fruits and vegetables, whole grains, lean proteins, low-fat dairy, and heart-healthy fats.  Work with your health care provider or diet and nutrition specialist (dietitian) to adjust your eating plan to your individual calorie needs. Marland Kitchen

## 2019-08-10 NOTE — Assessment & Plan Note (Addendum)
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  Patient states that she takes her medicine every morning with other meds, her medicine also makes her feel achy after several days of taking it so she stopped taking it. -Continue patient on 75 mcg levothyroxine for now -Reach out to pharmacy to switch Synthroid prescription/manufacture -Thyroid panel ordered today -Follow-up 3 months at the latest

## 2019-08-11 LAB — THYROID PANEL WITH TSH
Free Thyroxine Index: 0.7 — ABNORMAL LOW (ref 1.2–4.9)
T3 Uptake Ratio: 18 % — ABNORMAL LOW (ref 24–39)
T4, Total: 4 ug/dL — ABNORMAL LOW (ref 4.5–12.0)
TSH: 34.4 u[IU]/mL — ABNORMAL HIGH (ref 0.450–4.500)

## 2019-08-14 ENCOUNTER — Encounter: Payer: Self-pay | Admitting: Family Medicine

## 2019-08-14 LAB — CYTOLOGY - PAP
Chlamydia: NEGATIVE
Comment: NEGATIVE
Comment: NEGATIVE
Comment: NORMAL
Diagnosis: NEGATIVE
High risk HPV: NEGATIVE
Neisseria Gonorrhea: NEGATIVE

## 2019-08-15 NOTE — Progress Notes (Signed)
   Patient needs to follow up as needed for Cerumen impaction.  Would also benefit for f/u of HTN.  Dollene Cleveland, DO [facility]

## 2019-08-16 ENCOUNTER — Encounter: Payer: Self-pay | Admitting: Family Medicine

## 2019-08-16 ENCOUNTER — Ambulatory Visit (INDEPENDENT_AMBULATORY_CARE_PROVIDER_SITE_OTHER): Payer: Medicaid Other | Admitting: Family Medicine

## 2019-08-16 DIAGNOSIS — Z91199 Patient's noncompliance with other medical treatment and regimen due to unspecified reason: Secondary | ICD-10-CM | POA: Insufficient documentation

## 2019-08-16 DIAGNOSIS — Z5329 Procedure and treatment not carried out because of patient's decision for other reasons: Secondary | ICD-10-CM

## 2019-08-16 HISTORY — DX: Procedure and treatment not carried out because of patient's decision for other reasons: Z53.29

## 2019-08-16 HISTORY — DX: Patient's noncompliance with other medical treatment and regimen due to unspecified reason: Z91.199

## 2019-08-17 ENCOUNTER — Encounter: Payer: Self-pay | Admitting: Family Medicine

## 2019-08-17 ENCOUNTER — Other Ambulatory Visit: Payer: Self-pay

## 2019-08-17 ENCOUNTER — Ambulatory Visit (INDEPENDENT_AMBULATORY_CARE_PROVIDER_SITE_OTHER): Payer: Medicaid Other | Admitting: Family Medicine

## 2019-08-17 VITALS — BP 150/108 | HR 89 | Wt 239.4 lb

## 2019-08-17 DIAGNOSIS — H6123 Impacted cerumen, bilateral: Secondary | ICD-10-CM

## 2019-08-17 DIAGNOSIS — I10 Essential (primary) hypertension: Secondary | ICD-10-CM

## 2019-08-17 MED ORDER — LOSARTAN POTASSIUM-HCTZ 50-12.5 MG PO TABS: 1.0000 | ORAL_TABLET | Freq: Every day | ORAL | 1 refills | Status: DC

## 2019-08-17 NOTE — Progress Notes (Signed)
    SUBJECTIVE:   CHIEF COMPLAINT / HPI:    [name] is a 34 y.o. female here for follow up and complains of ear wax.    Increased ear wax Patient reports ongoing ear buildup.  Has used Debrox in the past with some relief.  Denies ear pain and hearing loss.    HTN Takes HCTZ. Denies missing doses of antihypertensive medication. Denies  lower extremity edema and exertional dyspnea. Endorses headache, seeing spots for the past month.  Taking HCTZ. Endoreses some occassional chest pain that is sharp in nature but is positional.  PERTINENT  PMH / PSH: uncontrolled hypertension   OBJECTIVE:   BP (!) 150/108   Pulse 89   Wt 239 lb 6.4 oz (108.6 kg)   LMP 08/07/2019 (Exact Date)   SpO2 100%   BMI 38.64 kg/m    GEN: pleasant well appearing female, in no acute distress  HENT: bilateral cerumen impaction CV: regular rate and rhythm, no murmurs appreciated  RESP: no increased work of breathing, clear to ascultation bilaterally with no crackles, wheezes, or rhonchi  MSK: no LE edema SKIN: warm, dry NEURO: A&O,moves all extremities appropriately  PSYCH: Normal affect and thought content   ASSESSMENT/PLAN:   Hypertension, uncontrolled Uncontrolled. BP today 150/108, recheck 144/96. Start losartan/HCTZ  50-12.5mg  combination tablet. Patient had Essure placed in 2015/2016. Stop HCTZ 25mg . Continue working on exercising and implemented healthy dietary habits.  - Follow up on lipid panel / CMP - Follow up in 4 weeks for medication titration    Cerumen impaction Irrigated by CMA.  Left TM normal, right partially occluded s/p irrigation.  Patient advised to use Debrox. Patient agreeable with plan.       , DO [facility]

## 2019-08-17 NOTE — Patient Instructions (Signed)
It was great seeing you today!   I'd like to see you back when you follow up for your thyroid recheck but if you need to be seen earlier than that for any new issues we're happy to fit you in, just give [place] a call!   If you have chest pain that is worsening with activity, lightheadedmess or shortness of breath please seek urgent medical attention.    If you have questions or concerns please do not hesitate to call at [phone].  Dr. Katherina Right [facility]

## 2019-08-18 LAB — COMPREHENSIVE METABOLIC PANEL
ALT: 18 IU/L (ref 0–32)
AST: 24 IU/L (ref 0–40)
Albumin/Globulin Ratio: 1.4 (ref 1.2–2.2)
Albumin: 4.4 g/dL (ref 3.8–4.8)
Alkaline Phosphatase: 75 IU/L (ref 39–117)
BUN/Creatinine Ratio: 12 (ref 9–23)
BUN: 12 mg/dL (ref 6–20)
Bilirubin Total: <0.2 mg/dL (ref 0.0–1.2)
CO2: 24 mmol/L (ref 20–29)
Calcium: 9.9 mg/dL (ref 8.7–10.2)
Chloride: 98 mmol/L (ref 96–106)
Creatinine, Ser: 1.03 mg/dL — ABNORMAL HIGH (ref 0.57–1.00)
GFR calc Af Amer: 83 mL/min/{1.73_m2} (ref >59)
GFR calc non Af Amer: 72 mL/min/{1.73_m2} (ref >59)
Globulin, Total: 3.2 g/dL (ref 1.5–4.5)
Glucose: 84 mg/dL (ref 65–99)
Potassium: 3.8 mmol/L (ref 3.5–5.2)
Sodium: 138 mmol/L (ref 134–144)
Total Protein: 7.6 g/dL (ref 6.0–8.5)

## 2019-08-18 LAB — LIPID PANEL
Chol/HDL Ratio: 5.3 ratio — ABNORMAL HIGH (ref 0.0–4.4)
Cholesterol, Total: 253 mg/dL — ABNORMAL HIGH (ref 100–199)
HDL: 48 mg/dL (ref >39)
LDL Chol Calc (NIH): 188 mg/dL — ABNORMAL HIGH (ref 0–99)
Triglycerides: 97 mg/dL (ref 0–149)
VLDL Cholesterol Cal: 17 mg/dL (ref 5–40)

## 2019-08-20 ENCOUNTER — Encounter: Payer: Self-pay | Admitting: Family Medicine

## 2019-08-20 DIAGNOSIS — H6120 Impacted cerumen, unspecified ear: Secondary | ICD-10-CM | POA: Insufficient documentation

## 2019-08-20 HISTORY — DX: Impacted cerumen, unspecified ear: H61.20

## 2019-08-20 NOTE — Assessment & Plan Note (Signed)
Irrigated by [employer].  Left TM normal, right partially occluded s/p irrigation.  Patient advised to use Debrox. Patient agreeable with plan.

## 2019-08-20 NOTE — Assessment & Plan Note (Addendum)
Uncontrolled. BP today 150/108, recheck 144/96. Start losartan/HCTZ  50-12.5mg  combination tablet. Patient had Essure placed in 2015/2016. Stop HCTZ 25mg . Continue working on exercising and implemented healthy dietary habits.  - Follow up on lipid panel / CMP - Follow up in 4 weeks for medication titration

## 2019-11-20 ENCOUNTER — Other Ambulatory Visit: Payer: Self-pay

## 2019-11-20 MED ORDER — LEVOTHYROXINE SODIUM 75 MCG PO TABS: 75.0000 ug | ORAL_TABLET | Freq: Every day | ORAL | 3 refills | Status: DC

## 2019-11-28 DIAGNOSIS — Z20822 Contact with and (suspected) exposure to covid-19: Secondary | ICD-10-CM | POA: Diagnosis not present

## 2019-12-18 ENCOUNTER — Encounter: Payer: Self-pay | Admitting: Family Medicine

## 2020-01-02 NOTE — Telephone Encounter (Signed)
Attempted to call pt. No answer. Called pt to make an appt to be seen for future refills. Aquilla Solian, CMA

## 2020-01-02 NOTE — Telephone Encounter (Signed)
LVM. For pt to call the office.  Aquilla Solian, CMA

## 2020-01-16 ENCOUNTER — Encounter: Payer: Self-pay | Admitting: Family Medicine

## 2020-01-16 NOTE — Progress Notes (Signed)
  Patient originally scheduled for an appointment on 01/17/2020, rescheduled for 01/22/2020.   Peggyann Shoals, DO [facility], PGY-3 01/17/2020 7:27 PM

## 2020-01-17 ENCOUNTER — Ambulatory Visit (INDEPENDENT_AMBULATORY_CARE_PROVIDER_SITE_OTHER): Payer: Medicaid Other | Admitting: Family Medicine

## 2020-01-17 DIAGNOSIS — Z5329 Procedure and treatment not carried out because of patient's decision for other reasons: Secondary | ICD-10-CM

## 2020-01-22 ENCOUNTER — Other Ambulatory Visit: Payer: Self-pay | Admitting: Family Medicine

## 2020-01-22 ENCOUNTER — Encounter: Payer: Self-pay | Admitting: Family Medicine

## 2020-01-22 ENCOUNTER — Other Ambulatory Visit: Payer: Self-pay

## 2020-01-22 ENCOUNTER — Ambulatory Visit (INDEPENDENT_AMBULATORY_CARE_PROVIDER_SITE_OTHER): Payer: Medicaid Other | Admitting: Family Medicine

## 2020-01-22 VITALS — BP 146/88 | HR 77 | Ht 66.0 in | Wt 245.6 lb

## 2020-01-22 DIAGNOSIS — I10 Essential (primary) hypertension: Secondary | ICD-10-CM | POA: Diagnosis present

## 2020-01-22 DIAGNOSIS — M79645 Pain in left finger(s): Secondary | ICD-10-CM

## 2020-01-22 DIAGNOSIS — E039 Hypothyroidism, unspecified: Secondary | ICD-10-CM | POA: Diagnosis not present

## 2020-01-22 DIAGNOSIS — Z1159 Encounter for screening for other viral diseases: Secondary | ICD-10-CM | POA: Insufficient documentation

## 2020-01-22 DIAGNOSIS — K219 Gastro-esophageal reflux disease without esophagitis: Secondary | ICD-10-CM | POA: Diagnosis not present

## 2020-01-22 DIAGNOSIS — Z23 Encounter for immunization: Secondary | ICD-10-CM | POA: Diagnosis not present

## 2020-01-22 MED ORDER — BLOOD PRESSURE MONITOR DEVI: 1.0000 | Freq: Every day | 0 refills | Status: DC

## 2020-01-22 NOTE — Progress Notes (Signed)
    SUBJECTIVE:   CHIEF COMPLAINT / HPI:   HTN: Blood pressure today is 146/88.  She reports that she is currently taking Losartan-HCTZ 50-12.5. Does not check her blood pressure at home.  She denies any headaches, vision changes, chest pain, shortness of breath.  Patient may benefit from increasing the dosage of this medication.  Hypothyroidism: TSH previously tested in April 2021 was elevated at 34.  Would like to test again.  Denies any fatigue, constipation, hair changes, skin changes.  Acid reflux: Patient endorses sensation of burning in chest that radiates up to her mouth from her stomach, also reports metal taste in her mouth.  Denies any other chest pain, shortness of breath.  Denies any issues or difficulties with swallowing, denies pain with swallowing.  Thumb pain: Patient endorses some left thumb pain that started about 2 weeks ago, she denies any injury such as falls or hitting it.  She believes that she may have slept on it unusually and now it is hurting.  She reports it feels as if she has to "pop" the joint at the base of her thumb.  Denies any bruising, swelling, bleeding.  Has not tried anything to help the pain.  Health maintenance: patient due for Hep C screen (drawn today), COVID vaccine (respectfully declined, counseling discussion was had), and Flu Vaccine (obtained)  PERTINENT  PMH / PSH: HTN, Hypothyroidism  OBJECTIVE:   BP (!) 146/88   Pulse 77   Ht 5\' 6"  (1.676 m)   Wt 245 lb 9.6 oz (111.4 kg)   LMP 01/10/2020 (Approximate)   SpO2 100%   BMI 39.64 kg/m    Physical exam: General: Well-appearing patient, no apparent distress Respiratory: CTA bilaterally, comfortable work of breathing Cardio: RRR, S1-S2 present, no murmurs appreciated Left Hand: Inspection: No obvious deformity. No swelling, erythema or bruising Palpation: TTP appreciated at patient's [facility] and MCP ROM: Full ROM of the digits and wrist Strength: 5/5 strength in the forearm, wrist and  interosseus muscles Neurovascular: NV intact Special tests: Negative finkelstein's, negative tinel's at the carpal tunnel   ASSESSMENT/PLAN:   Encounter for hepatitis C screening test for low risk patient -Hepatitis C test ordered at today's visit  Hypothyroidism -TSH ordered at today's visit, came back elevated at 35.  Patient was called regarding this result, her levothyroxine was increased from 88 mcg daily to 100 mcg daily.  Proper instructions for taking this medication were reinforced.  Hypertension, uncontrolled BP today 146/88.  Patient endorses compliance with medication losartan-HCTZ 50-12.5 mg. -Patient instructed to start taking 2 of these tablets daily to increase dosage to losartan-HCTZ 100-25 -Plan to follow-up in about 2 weeks for repeat blood pressure check  Thumb pain Patient told to consider a thumb brace to wear at night, as she feels the pain started happening at night. Tylenol 500mg  with Ibuprofen 200mg  together 3 times daily as needed for pain for the next 3 days.   Acid reflux -Famotidine 20 mg over-the-counter once daily     [facility], DO [facility]

## 2020-01-22 NOTE — Patient Instructions (Signed)
Thank you for coming in to see [place] today! Please see below to review our plan for today's visit:  1. Start taking your Losartan-HCTZ 2 tablets every morning.  2. Remember plate proportions: half of what you eat should be vegetables, 1/4 meat, 1/4 carbs (pastas, rice, bread, potato).  3. You can try Famotidine 20mg  (also called Pepcid) for acid reflux.  4. Consider a thumb brace to wear at night. Tylenol 500mg  with Ibuprofen 200mg  together 3 times daily as needed for pain for the next 3 days.   Please call the clinic at [phone] if your symptoms worsen or you have any concerns. It was our pleasure to serve you!   Dr. [facility]

## 2020-01-23 LAB — TSH: TSH: 35.7 u[IU]/mL — ABNORMAL HIGH (ref 0.450–4.500)

## 2020-01-23 LAB — HEPATITIS C ANTIBODY: Hep C Virus Ab: <0.1 s/co ratio (ref 0.0–0.9)

## 2020-01-23 MED ORDER — LOSARTAN POTASSIUM-HCTZ 100-25 MG PO TABS: 1.0000 | ORAL_TABLET | Freq: Every day | ORAL | 3 refills | Status: DC

## 2020-01-23 MED ORDER — LEVOTHYROXINE SODIUM 100 MCG PO TABS: 100.0000 ug | ORAL_TABLET | Freq: Every day | ORAL | 3 refills | Status: DC

## 2020-01-28 ENCOUNTER — Other Ambulatory Visit: Payer: Self-pay | Admitting: Family Medicine

## 2020-01-28 DIAGNOSIS — K219 Gastro-esophageal reflux disease without esophagitis: Secondary | ICD-10-CM | POA: Insufficient documentation

## 2020-01-28 DIAGNOSIS — M79646 Pain in unspecified finger(s): Secondary | ICD-10-CM | POA: Insufficient documentation

## 2020-01-28 DIAGNOSIS — M79645 Pain in left finger(s): Secondary | ICD-10-CM

## 2020-01-28 NOTE — Assessment & Plan Note (Signed)
-  Hepatitis C test ordered at today's visit

## 2020-01-28 NOTE — Assessment & Plan Note (Signed)
Patient told to consider a thumb brace to wear at night, as she feels the pain started happening at night. Tylenol 500mg  with Ibuprofen 200mg  together 3 times daily as needed for pain for the next 3 days.

## 2020-01-28 NOTE — Assessment & Plan Note (Signed)
BP today 146/88.  Patient endorses compliance with medication losartan-HCTZ 50-12.5 mg. -Patient instructed to start taking 2 of these tablets daily to increase dosage to losartan-HCTZ 100-25 -Plan to follow-up in about 2 weeks for repeat blood pressure check

## 2020-01-28 NOTE — Assessment & Plan Note (Signed)
-  TSH ordered at today's visit, came back elevated at 35.  Patient was called regarding this result, her levothyroxine was increased from 88 mcg daily to 100 mcg daily.  Proper instructions for taking this medication were reinforced.

## 2020-01-28 NOTE — Assessment & Plan Note (Signed)
-  Famotidine 20 mg over-the-counter once daily

## 2020-01-28 NOTE — Progress Notes (Signed)
Patient sends my chart message reporting that her left MCP have been causing her pain without any history of trauma, accident.  Reports she believes that she has slept on this in a weird fashion causing it to hurt.  She has tried Tylenol, ibuprofen, also tried sleeping in a thumb splint at night to help protect it from her sleeping position.  She reports that the pain continues and has slowly gotten worse.  Did not obtain x-rays of thumb as there is no history of fall, trauma, no evidence of bruising or swelling to suggest fracture.  Additionally, there was no scaphoid or "anatomical snuffbox" tenderness appreciated on physical exam.  Placing referral to sports medicine for further evaluation, imaging.  Patient may benefit from ultrasounding.  Peggyann Shoals, DO [facility], PGY-3 01/28/2020 7:50 PM

## 2020-01-31 ENCOUNTER — Ambulatory Visit (INDEPENDENT_AMBULATORY_CARE_PROVIDER_SITE_OTHER): Payer: Medicaid Other | Admitting: Family Medicine

## 2020-01-31 ENCOUNTER — Ambulatory Visit
Admission: RE | Admit: 2020-01-31 | Discharge: 2020-01-31 | Disposition: A | Discharge status: Home / Self Care | Payer: Medicaid Other | Source: Ambulatory Visit | Attending: Sports Medicine | Admitting: Sports Medicine

## 2020-01-31 ENCOUNTER — Other Ambulatory Visit: Payer: Self-pay

## 2020-01-31 VITALS — BP 124/82 | Ht 67.0 in | Wt 245.0 lb

## 2020-01-31 DIAGNOSIS — M79645 Pain in left finger(s): Secondary | ICD-10-CM

## 2020-01-31 NOTE — Patient Instructions (Signed)
-   Get Xrays today to evaluate for any underlying arthritis in the joints that are bothering you.  - If your Xrays show any changes in the joints, we will contact you and order labs to evaluate for problems that could cause early arthritis.  -Continue to wear the thumb splint at night when you're sleeping -Put Voltaren Gel on your hand every 4-6hrs as needed for pain. This is an Antiinflammatory (Like Ibuprofen), that doesn't have the same risks as stomach upset as oral medications.  -If bracing and Voltaren Gel are enough to relieve your symptoms in the next 3-4 weeks, we don't need to see you back. Otherwise, come back to clinic in about a month if your pain hasn't improved.

## 2020-01-31 NOTE — Progress Notes (Signed)
   Office Visit Note   Patient: [name]           Date of Birth: January 22, 1986           MRN: [id number] Visit Date: 01/31/2020 Requested by: Moses Manners, MD [address] [place],  [place] [address] PCP: Dollene Cleveland, DO  Subjective: CC: Left Thumb Pain  HPI: 34 year old female presenting to clinic today with concerns of left thumb pain for the past month.  Patient denies any trauma, but states that she sleeps with her hands curled beneath her and is worried that the sleeping position caused her pain.  She describes that her pain is aching in nature, and focused over her MCP and CMC joints. Pain is worsened by flexing the thumb (especially if she tries to 'pop' it in extreme flexion). She endorses some pain over the dorsal aspect, though it is primarily within the joints themselves. She is scared she may have arthritis, as she has a family history of autoiimune disease. She denies any viral symptoms prior to the onset of her pain. She states she is otherwise in good health.               ROS:   All other systems were reviewed and are negative.  Objective: Vital Signs: BP 124/82   Ht 5\' 7"  (1.702 m)   Wt 245 lb (111.1 kg)   LMP 01/10/2020 (Approximate)   BMI 38.37 kg/m   Physical Exam:  General:  Alert and oriented, in no acute distress. Pulm:  Breathing unlabored. Psy:  Normal mood, congruent affect. Skin:  Left hand with no bruises, no rashes. Skin intact.   Left Hand:  No gross deformity Full ROM in all fingers, wrist, and elbow.   Tenderness to palpation over the 1st CMC and MCP joints on left thumb, though no obvious bony swelling or deformity.  Finklesteins weakly positive Minimal tenderness over the snuff box and lunate prominence.  No carpal instability with shift testing  5/5 strength without pain with wrist flexion/extension, finger flextion/extension, abduction. Does endorse some pain with resisted thumb abduction, though strength is intact.    Full sensation throughout all fingers.  Tinel's negative at Carpal, guyon, and Cubital tunnels.     Imaging: No results found.  Assessment & Plan: 34 year old female presenting to clinic with insidious onset of left thumb pain, focused primarily in the [facility] and MCP joint.  Examination as above, with no evidence of bony deformity.  - Suspect patient is correct in her assessment that sleeping with her hand folded beneath her has caused strain to the affected joints. - Continue to wear thumb Spica splint, especially at night to prevent uncomfortable sleeping positions - Voltaren Gel PRN Pain - Will obtain XRays due to patient's concern for early arthritis and family history of autoimmunity -If bracing and Voltaren control symptoms, no further follow up needed. Otherwise, RTC in 3-4 weeks for reevaluation.    I was the preceptor for this visit and available for immediate consultation [facility], DO

## 2020-02-03 ENCOUNTER — Encounter: Payer: Self-pay | Admitting: Family Medicine

## 2020-03-01 ENCOUNTER — Encounter: Payer: Self-pay | Admitting: Family Medicine

## 2020-03-19 ENCOUNTER — Ambulatory Visit: Payer: Medicaid Other | Admitting: Family Medicine

## 2020-03-25 NOTE — Progress Notes (Signed)
    SUBJECTIVE:   CHIEF COMPLAINT / HPI: Possible yeast infection   [name] is a 34 year old female presenting for evaluation of vaginal changes.  She reports approximately for the past 4 days she has noticed increase in vaginal itching and irritation.  She recently switched brands of tampons and body wash and noticed a difference after this.  Minimal change in vaginal discharge, white/clear.  She is sexually active with one female partner, does not use barrier protection, has a history of tubal ligation.  No concern for STDs however would like to check.  Denies any associated rash, hematuria, abdominal pain, fever, dyspareunia.  She does not do any douching.  Wears cotton underwear and usually loose clothing.  PERTINENT  PMH / PSH: HTN, asthma, GERD, hypothyroidism, ADHD   OBJECTIVE:   BP 118/80   Pulse 84   Ht 5\' 4"  (1.626 m)   Wt 240 lb (108.9 kg)   SpO2 97%   BMI 41.20 kg/m   General: Alert, NAD Lungs: No increased WOB  Abdomen: soft, non-tender Ext: Warm, dry Pelvic exam: VULVA: normal appearing vulva with no masses, tenderness, rash, or lesions, VAGINA: normal appearing vagina with normal color, white milky discharge present, no lesions, CERVIX: normal appearing cervix without lesions, cervical motion tenderness absent, exam chaperoned by CMA.  Microscopic wet-mount exam shows negative for pathogens, normal epithelial cells.  ASSESSMENT/PLAN:   Vaginal itching Clinically consistent with yeast vaginitis despite normal wet prep.  Rx'd Diflucan 150 mg x2.  Will follow up gc/ch, however low concern for infection.  Hypothyroidism Recently had Synthroid increased to 100 mcg in 01/2020 after elevated TSH.  Will recheck TSH today and have her follow-up with her PCP for continued titration as needed.  Hypertension, uncontrolled Well-controlled today after medication increase in 01/2020.  Continue losartan-HCTZ as is.    Follow-up in 2-3 weeks with PCP for chronic management,  or sooner if vaginal symptoms not improving.  02/2020, DO [facility]

## 2020-03-26 ENCOUNTER — Other Ambulatory Visit (HOSPITAL_COMMUNITY)
Admission: RE | Admit: 2020-03-26 | Discharge: 2020-03-26 | Disposition: A | Discharge status: Home / Self Care | Payer: Medicaid Other | Source: Ambulatory Visit | Attending: Family Medicine | Admitting: Family Medicine

## 2020-03-26 ENCOUNTER — Encounter: Payer: Self-pay | Admitting: Family Medicine

## 2020-03-26 ENCOUNTER — Other Ambulatory Visit: Payer: Self-pay

## 2020-03-26 ENCOUNTER — Ambulatory Visit (INDEPENDENT_AMBULATORY_CARE_PROVIDER_SITE_OTHER): Payer: Medicaid Other | Admitting: Family Medicine

## 2020-03-26 VITALS — BP 118/80 | HR 84 | Ht 64.0 in | Wt 240.0 lb

## 2020-03-26 DIAGNOSIS — N898 Other specified noninflammatory disorders of vagina: Secondary | ICD-10-CM | POA: Insufficient documentation

## 2020-03-26 DIAGNOSIS — E039 Hypothyroidism, unspecified: Secondary | ICD-10-CM | POA: Diagnosis not present

## 2020-03-26 DIAGNOSIS — I10 Essential (primary) hypertension: Secondary | ICD-10-CM | POA: Diagnosis not present

## 2020-03-26 LAB — POCT WET PREP (WET MOUNT)
Clue Cells Wet Prep Whiff POC: NEGATIVE
Trichomonas Wet Prep HPF POC: ABSENT

## 2020-03-26 MED ORDER — FLUCONAZOLE 150 MG PO TABS: 150.0000 mg | ORAL_TABLET | ORAL | 0 refills | Status: DC

## 2020-03-26 NOTE — Patient Instructions (Signed)
Wonderful to see you today! I will send in diflucan to help with your symptoms.   I will send a mychart message with the rest of your labs.

## 2020-03-26 NOTE — Assessment & Plan Note (Signed)
Clinically consistent with yeast vaginitis despite normal wet prep.  Rx'd Diflucan 150 mg x2.  Will follow up gc/ch, however low concern for infection.

## 2020-03-26 NOTE — Assessment & Plan Note (Signed)
Recently had Synthroid increased to 100 mcg in 01/2020 after elevated TSH.  Will recheck TSH today and have her follow-up with her PCP for continued titration as needed.

## 2020-03-26 NOTE — Assessment & Plan Note (Signed)
Well-controlled today after medication increase in 01/2020.  Continue losartan-HCTZ as is.

## 2020-03-27 ENCOUNTER — Other Ambulatory Visit: Payer: Self-pay | Admitting: Family Medicine

## 2020-03-27 DIAGNOSIS — E039 Hypothyroidism, unspecified: Secondary | ICD-10-CM

## 2020-03-27 LAB — CERVICOVAGINAL ANCILLARY ONLY
Chlamydia: NEGATIVE
Comment: NEGATIVE
Comment: NORMAL
Neisseria Gonorrhea: NEGATIVE

## 2020-03-27 LAB — TSH: TSH: 20.7 u[IU]/mL — ABNORMAL HIGH (ref 0.450–4.500)

## 2020-03-27 MED ORDER — LEVOTHYROXINE SODIUM 125 MCG PO TABS: 125.0000 ug | ORAL_TABLET | Freq: Every day | ORAL | 1 refills | Status: DC

## 2020-04-22 ENCOUNTER — Encounter: Payer: Self-pay | Admitting: Family Medicine

## 2020-04-22 ENCOUNTER — Ambulatory Visit (INDEPENDENT_AMBULATORY_CARE_PROVIDER_SITE_OTHER): Payer: Medicaid Other | Admitting: Family Medicine

## 2020-04-22 ENCOUNTER — Other Ambulatory Visit: Payer: Self-pay

## 2020-04-22 VITALS — BP 140/98 | HR 64 | Ht 67.0 in | Wt 238.0 lb

## 2020-04-22 DIAGNOSIS — E039 Hypothyroidism, unspecified: Secondary | ICD-10-CM

## 2020-04-22 DIAGNOSIS — K130 Diseases of lips: Secondary | ICD-10-CM | POA: Diagnosis not present

## 2020-04-22 DIAGNOSIS — R519 Headache, unspecified: Secondary | ICD-10-CM | POA: Insufficient documentation

## 2020-04-22 LAB — POCT URINALYSIS DIP (MANUAL ENTRY)
Bilirubin, UA: NEGATIVE
Glucose, UA: NEGATIVE mg/dL
Ketones, POC UA: NEGATIVE mg/dL
Leukocytes, UA: NEGATIVE
Nitrite, UA: NEGATIVE
Protein Ur, POC: NEGATIVE mg/dL
Spec Grav, UA: 1.02 (ref 1.010–1.025)
Urobilinogen, UA: 0.2 E.U./dL
pH, UA: 7.5 (ref 5.0–8.0)

## 2020-04-22 LAB — GLUCOSE, POCT (MANUAL RESULT ENTRY): POC Glucose: 103 mg/dl — AB (ref 70–99)

## 2020-04-22 NOTE — Patient Instructions (Addendum)
It was great seeing you today!  I will send you a MyChart message with your results.   Visit Remembers: - Continue to work on your healthy eating habits and incorporating exercise into your daily life.  - Your goal is to have an BP < 120 / <80 - Take Ibuprofen/Excedrin for headache.  - Be sure to stay hydrated.    Regarding lab work today:  Due to recent changes in healthcare laws, you may see the results of your imaging and laboratory studies on MyChart before your provider has had a chance to review them.  I understand that in some cases there may be results that are confusing or concerning to you. Not all laboratory results come back in the same time frame and you may be waiting for multiple results in order to interpret others.  Please give [place] 72 hours in order for your provider to thoroughly review all the results before contacting the office for clarification of your results. If everything is normal, you will get a letter in the mail or a message in My Chart. Please give [place] a call if you do not hear from [place] after 2 weeks.  Please bring all of your medications with you to each visit.    If you haven't already, sign up for My Chart to have easy access to your labs results, and communication with your primary care physician.  Feel free to call with any questions or concerns at any time, at [phone].   Take care,  Dr. Katherina Right [facility]

## 2020-04-22 NOTE — Progress Notes (Signed)
   SUBJECTIVE:   CHIEF COMPLAINT / HPI:   Chief Complaint  Patient presents with  . dry lips    X 4 days - did not use anything new.  Wonders if it is related to Thyroid meds  . Headache    X 3 days - hx of migraines - has not tried any meds     [name] is a 35 y.o. female here for persistent dry labs and headache.  Patient states she started taking her thyroid medication daily (100 mcg)  for the past 2 months.  She has not yet switched to the 125 mcg tablets.  Has history of hyperthyroidism disease and describes being hospitalized with emergency radiation.  "I almost died." Patient noticed about 4 days ago she has been having extremely dry lips.  Has been using Blistex, Vaseline and aloe without improvement.  Has associated headache, hot flashes and increased urination.  States that she has been very warm.  She has been drinking Gatorade and water to be sure she stays hydrated.    Regarding her headaches, she has history of migraines a resolved with Excedrin.  She has not taken Excedrin as she wanted to come to the doctor first.  Head pain is temporal.  Endorses neck pain and nausea.  Headaches are similar to her her migraines in the past.  Patient's last menstrual period was 04/14/2020.    PERTINENT  PMH / PSH: reviewed and updated as appropriate   OBJECTIVE:   BP (!) 140/98   Pulse 64   Ht 5\' 7"  (1.702 m)   Wt 238 lb (108 kg)   LMP 04/14/2020   BMI 37.28 kg/m    GEN: Well-appearing, in no acute distress  CV: regular rate and rhythm, no murmurs appreciated  RESP: no increased work of breathing, clear to ascultation bilaterally with no crackles, wheezes, or rhonchi  MSK: Moving all extremities appropriately, no calf tenderness, no edema NEURO: grossly normal, CN 2-12 intact, normal speech, A&O SKIN: warm, dry, left leg has congenital hyperpigmented patch     ASSESSMENT/PLAN:   Hypothyroidism Concerned medication induced hyperthyroidism.  Recheck TSH  today.  Nonintractable headache Neuro exam unremarkable. History most consistent with her history of migraine headaches.  Will obtain BMP, CBG and UA as patient reports polyuria.  Has family history of diabetes. Headache possibly related to recent compliance to thyroid and antihypertensive medication. Tension headache can not be ruled out.   Recommended Excedrin for headache as this has worked for her in the past.      04/16/2020, DO PGY-2, [facility] 04/22/2020

## 2020-04-22 NOTE — Assessment & Plan Note (Addendum)
Neuro exam unremarkable. History most consistent with her history of migraine headaches.  Will obtain BMP, CBG and UA as patient reports polyuria.  Has family history of diabetes. Headache possibly related to recent compliance to thyroid and antihypertensive medication. Tension headache can not be ruled out.   Recommended Excedrin for headache as this has worked for her in the past.

## 2020-04-22 NOTE — Assessment & Plan Note (Signed)
Concerned medication induced hyperthyroidism.  Recheck TSH today.

## 2020-04-23 ENCOUNTER — Encounter: Payer: Self-pay | Admitting: Family Medicine

## 2020-04-23 LAB — BASIC METABOLIC PANEL
BUN/Creatinine Ratio: 10 (ref 9–23)
BUN: 8 mg/dL (ref 6–20)
CO2: 26 mmol/L (ref 20–29)
Calcium: 9.1 mg/dL (ref 8.7–10.2)
Chloride: 99 mmol/L (ref 96–106)
Creatinine, Ser: 0.83 mg/dL (ref 0.57–1.00)
GFR calc Af Amer: 107 mL/min/{1.73_m2} (ref >59)
GFR calc non Af Amer: 93 mL/min/{1.73_m2} (ref >59)
Glucose: 89 mg/dL (ref 65–99)
Potassium: 3.2 mmol/L — ABNORMAL LOW (ref 3.5–5.2)
Sodium: 139 mmol/L (ref 134–144)

## 2020-04-23 LAB — TSH: TSH: 5.27 u[IU]/mL — ABNORMAL HIGH (ref 0.450–4.500)

## 2020-04-25 DIAGNOSIS — Z20822 Contact with and (suspected) exposure to covid-19: Secondary | ICD-10-CM | POA: Diagnosis not present

## 2020-05-02 ENCOUNTER — Other Ambulatory Visit: Payer: Self-pay | Admitting: Family Medicine

## 2020-05-02 ENCOUNTER — Encounter: Payer: Self-pay | Admitting: Family Medicine

## 2020-05-02 DIAGNOSIS — E039 Hypothyroidism, unspecified: Secondary | ICD-10-CM

## 2020-05-02 MED ORDER — LEVOTHYROXINE SODIUM 137 MCG PO TABS
125.0000 ug | ORAL_TABLET | Freq: Every day | ORAL | 1 refills | Status: DC
Start: 2020-05-02 — End: 2020-07-14

## 2020-05-05 ENCOUNTER — Emergency Department (HOSPITAL_COMMUNITY)
Admission: EM | Admit: 2020-05-05 | Discharge: 2020-05-05 | Disposition: A | Discharge status: Home / Self Care | Payer: Medicaid Other | Attending: Emergency Medicine | Admitting: Emergency Medicine

## 2020-05-05 ENCOUNTER — Other Ambulatory Visit: Payer: Self-pay

## 2020-05-05 ENCOUNTER — Encounter (HOSPITAL_COMMUNITY): Payer: Self-pay | Admitting: *Deleted

## 2020-05-05 ENCOUNTER — Telehealth: Payer: Self-pay | Admitting: Family Medicine

## 2020-05-05 DIAGNOSIS — Z20822 Contact with and (suspected) exposure to covid-19: Secondary | ICD-10-CM | POA: Insufficient documentation

## 2020-05-05 DIAGNOSIS — J3489 Other specified disorders of nose and nasal sinuses: Secondary | ICD-10-CM | POA: Insufficient documentation

## 2020-05-05 DIAGNOSIS — R590 Localized enlarged lymph nodes: Secondary | ICD-10-CM | POA: Insufficient documentation

## 2020-05-05 DIAGNOSIS — H9202 Otalgia, left ear: Secondary | ICD-10-CM | POA: Insufficient documentation

## 2020-05-05 DIAGNOSIS — Z5321 Procedure and treatment not carried out due to patient leaving prior to being seen by health care provider: Secondary | ICD-10-CM | POA: Diagnosis not present

## 2020-05-05 DIAGNOSIS — R519 Headache, unspecified: Secondary | ICD-10-CM | POA: Diagnosis not present

## 2020-05-05 LAB — CBC
HCT: 37.9 % (ref 36.0–46.0)
Hemoglobin: 13.1 g/dL (ref 12.0–15.0)
MCH: 31.8 pg (ref 26.0–34.0)
MCHC: 34.6 g/dL (ref 30.0–36.0)
MCV: 92 fL (ref 80.0–100.0)
Platelets: 381 10*3/uL (ref 150–400)
RBC: 4.12 MIL/uL (ref 3.87–5.11)
RDW: 12.2 % (ref 11.5–15.5)
WBC: 5.2 10*3/uL (ref 4.0–10.5)
nRBC: 0 % (ref 0.0–0.2)

## 2020-05-05 LAB — I-STAT CHEM 8, ED
BUN: 8 mg/dL (ref 6–20)
Calcium, Ion: 1.13 mmol/L — ABNORMAL LOW (ref 1.15–1.40)
Chloride: 102 mmol/L (ref 98–111)
Creatinine, Ser: 0.9 mg/dL (ref 0.44–1.00)
Glucose, Bld: 85 mg/dL (ref 70–99)
HCT: 39 % (ref 36.0–46.0)
Hemoglobin: 13.3 g/dL (ref 12.0–15.0)
Potassium: 2.9 mmol/L — ABNORMAL LOW (ref 3.5–5.1)
Sodium: 140 mmol/L (ref 135–145)
TCO2: 28 mmol/L (ref 22–32)

## 2020-05-05 MED ORDER — POTASSIUM CHLORIDE CRYS ER 20 MEQ PO TBCR: 40.0000 meq | EXTENDED_RELEASE_TABLET | Freq: Once | ORAL | Status: DC

## 2020-05-05 MED ORDER — ACETAMINOPHEN 500 MG PO TABS: 1000.0000 mg | ORAL_TABLET | Freq: Once | ORAL | Status: DC

## 2020-05-05 NOTE — ED Triage Notes (Signed)
Congestion and ha, neck supple (swollen lymph nodes, no fever or chills, no sob)

## 2020-05-05 NOTE — ED Triage Notes (Signed)
Pt is here for headache for almost 2 weeks.  Had negative covid test.  Pt is reporting sinus congestion, left ear pain and left sided swollen lymph nodes.  No sob.

## 2020-05-05 NOTE — Telephone Encounter (Signed)
Emergency After-Hours Line  Patient reports severe left headache x 4 days. Endorses pain that is worse than her migraines before. Describes the pain as "pressure" that seems to be getting worse. She also has left sided neck pain and jaw pain. She endorses vision changes in her left eye. Denies lacrimation of her eye or nose.  She has pressure In her ears as well. She has been taking allergy medicine, Ibuprofen 800mg  without any improvement. She did try Advil and that did help some.The pain was so severe she hasnt been able to sleep. Also endorses runny nose but denies fever or cough. COVID negative 3 days ago. Does endorse elevated BP at her last visit of 140/98. Has been taking her blood pressure medicine. Unable to check BP at home.   PMH: HTN, hypothyrodism FMH: HTN, stroke, DM, MI  Plan: Differential is broad with most concerning is subarachnoid hemorrhage, giant cell arteritis, angle closure glaucoma, severely elevated HTN. Other differentials migraine, URI/sinus infection, tooth infection. Given severity of symptoms and lack of improvement with OTC medications, recommended ED visit for evaluation and possible imaging. Patient was amendable to this plan and headed at the time of conversation.

## 2020-05-07 ENCOUNTER — Ambulatory Visit (INDEPENDENT_AMBULATORY_CARE_PROVIDER_SITE_OTHER): Payer: Medicaid Other | Admitting: Family Medicine

## 2020-05-07 ENCOUNTER — Other Ambulatory Visit: Payer: Self-pay

## 2020-05-07 ENCOUNTER — Emergency Department (HOSPITAL_BASED_OUTPATIENT_CLINIC_OR_DEPARTMENT_OTHER)
Admission: EM | Admit: 2020-05-07 | Discharge: 2020-05-07 | Disposition: A | Discharge status: Home / Self Care | Payer: Medicaid Other | Attending: Physician Assistant | Admitting: Physician Assistant

## 2020-05-07 ENCOUNTER — Encounter (HOSPITAL_BASED_OUTPATIENT_CLINIC_OR_DEPARTMENT_OTHER): Payer: Self-pay

## 2020-05-07 ENCOUNTER — Encounter: Payer: Self-pay | Admitting: Family Medicine

## 2020-05-07 DIAGNOSIS — R519 Headache, unspecified: Secondary | ICD-10-CM | POA: Diagnosis not present

## 2020-05-07 DIAGNOSIS — Z79899 Other long term (current) drug therapy: Secondary | ICD-10-CM | POA: Diagnosis not present

## 2020-05-07 DIAGNOSIS — E039 Hypothyroidism, unspecified: Secondary | ICD-10-CM | POA: Insufficient documentation

## 2020-05-07 DIAGNOSIS — J45909 Unspecified asthma, uncomplicated: Secondary | ICD-10-CM | POA: Insufficient documentation

## 2020-05-07 DIAGNOSIS — J0100 Acute maxillary sinusitis, unspecified: Secondary | ICD-10-CM | POA: Diagnosis not present

## 2020-05-07 DIAGNOSIS — H9202 Otalgia, left ear: Secondary | ICD-10-CM | POA: Diagnosis present

## 2020-05-07 DIAGNOSIS — Z87891 Personal history of nicotine dependence: Secondary | ICD-10-CM | POA: Diagnosis not present

## 2020-05-07 DIAGNOSIS — Z20822 Contact with and (suspected) exposure to covid-19: Secondary | ICD-10-CM | POA: Insufficient documentation

## 2020-05-07 MED ORDER — AMOXICILLIN-POT CLAVULANATE 875-125 MG PO TABS: 1.0000 | ORAL_TABLET | Freq: Two times a day (BID) | ORAL | 0 refills | Status: AC

## 2020-05-07 NOTE — ED Triage Notes (Signed)
Pt c/o left ear pain, left jaw pain and swelling to left side of neck-pt states she has concerns she had covid sx stared 1/6 with ?neg covid test 1/11-NAD-steady gait

## 2020-05-07 NOTE — Discharge Instructions (Signed)
Return if any problems.  Your covid is pending 

## 2020-05-07 NOTE — Progress Notes (Signed)
[facility] Telemedicine Visit  Patient consented to have virtual visit and was identified by name and date of birth. Method of visit: Telephone  Encounter participants: Patient: [name] - located at [facility] parking lot  Provider: Ronnald Ramp - located at [facility]   Chief Complaint: headache   HPI: headache   Patient reports that she has been experiencing a severe headache, runny nose and cough since Jan 9th. She reports having a child sent home due to concern for covid exposure and the entire family has been unwell since Jan 6th.  She reports that her headache is involving her left side with associated jaw edema, lip swelling, rhinorrhea, sore throat and ear pain.  She reports feeling head pressure and swollen LN. She denies any recent trauma,  She reports after difficulty with obtaining covid tests, the results for half of her family resulted negative but was told by the testing site that results had been "mixed" up so she is not confident of the results that were reported to them. She still reports having bodyaches, SOB, HA and cough so both she and her children have been quarantining.  The HA was initially throbbing in nature and then began to travel down the left side of her face and jaw. She reports having a wisdom tooth growing in and thinks that may be contributing to her pain. She reports normally taking excedrin for her headaches but it has not helped as much as 800mg  ibuprofen has. She is concerned because the pressure in her head makes it difficult to lie down and she reports some numbness in her left hand. She also reports some pain in her left eye. She reports 100% adherence to her BP medication regimen for the last 3 months. She does not have equipment to measure her BP at home.    ROS: per HPI  Pertinent PMHx:  Hypothyroidism HTN   Exam:  LMP 04/14/2020   Respiratory: speaking in clear sentences, no audible signs of respiratory distress  via telephone   Assessment/Plan:  Nonintractable headache Presence of daily HA associated with facial edema in setting of likely viral illness. Concern for potential dental or sinus infection/abscess due to swelling. Must also consider TIA/stroke given reports of left hand numbness and if she indeed does have Covid, would be at risk of hypercoagulable state with risk factors of obesity and HTN. Discussed with patient recommendation to be seen in ED given persistent symptoms and need for imaging to aid in diagnosis. Patient was agreeable with this plan.     Time spent during visit with patient: 14 minutes

## 2020-05-07 NOTE — Assessment & Plan Note (Signed)
Presence of daily HA associated with facial edema in setting of likely viral illness. Concern for potential dental or sinus infection/abscess due to swelling. Must also consider TIA/stroke given reports of left hand numbness and if she indeed does have Covid, would be at risk of hypercoagulable state with risk factors of obesity and HTN. Discussed with patient recommendation to be seen in ED given persistent symptoms and need for imaging to aid in diagnosis. Patient was agreeable with this plan.

## 2020-05-07 NOTE — ED Provider Notes (Signed)
[facility] EMERGENCY DEPARTMENT Provider Note   CSN: [id number] Arrival date & time: 05/07/20  1207     History Chief Complaint  Patient presents with  . Otalgia    [name] is a 35 y.o. female.  The history is provided by the patient. No language interpreter was used.  Otalgia Location:  Left Behind ear:  No abnormality Quality:  Aching Severity:  Moderate Onset quality:  Gradual Progression:  Worsening Chronicity:  New Relieved by:  Nothing Worsened by:  Nothing Ineffective treatments:  None tried Pt complains of sinus congestion. Pt reports she has had sinus congestion and pressure for 3 weeks.  Pt reports she does not think she has covid.      Past Medical History:  Diagnosis Date  . Anemia    all 3 pregnancies  . Asthma    albuterol inhaler, last used one year ago  . Bipolar disorder (HCC)   . Cerumen impaction 08/20/2019  . Gestational hypertension 2010   with 3rd pregnancy  . Grave's disease   . Hyperemesis    3rd pregnancy  . No-show for appointment 08/16/2019  . 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 Preterm labor    with 3rd pregnancy  . Thyroid disease    had radioactive iodine  . Well woman exam with routine gynecological exam 08/10/2019    Patient Active Problem List   Diagnosis Date Noted  . Nonintractable headache 04/22/2020  . Vaginal itching 03/26/2020  . Thumb pain 01/28/2020  . Acid reflux 01/28/2020  . Encounter for hepatitis C screening test for low risk patient 01/22/2020  . No-show for appointment 08/16/2019  . Depression 08/18/2014  . 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 Seasonal allergies 07/08/2014  . Hypothyroidism 07/03/2014  . 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 Hypertension, uncontrolled 07/06/2012  . Asthma 12/16/2010    Past Surgical History:  Procedure Laterality Date  . DILATION AND CURETTAGE OF UTERUS  2010  . LAPAROSCOPIC TUBAL LIGATION  08/10/2011   Procedure:  LAPAROSCOPIC TUBAL LIGATION;  Surgeon: Tereso Newcomer, MD;  Location: [facility];  Service: Gynecology;  Laterality: Bilateral;     OB History    Gravida  5   Para  4   Term  3   Preterm  1   AB  1   Living  4     SAB  1   IAB      Ectopic      Multiple      Live Births  4           Family History  Problem Relation Age of Onset  . Hypertension Father   . Hypertension Mother   . Anemia Mother   . Bipolar disorder Mother   . Thyroid disease Maternal Aunt   . Bipolar disorder Maternal Aunt   . Diabetes Maternal Uncle   . Diabetes Maternal Grandmother   . Diabetes Paternal Grandmother   . Cancer Paternal Grandmother   . Anesthesia problems Neg Hx   . Hypotension Neg Hx   . Malignant hyperthermia Neg Hx   . Pseudochol deficiency Neg Hx     Social History   Tobacco Use  . Smoking status: Former Smoker    Packs/day: 0.25    Years: 0.50    Pack years: 0.12    Types: Cigarettes    Quit date: 11/18/2011    Years since quitting: 8.4  . Smokeless tobacco: Never Used  Substance Use Topics  .  Alcohol use: No  . Drug use: Yes    Types: Marijuana    Home Medications Prior to Admission medications   Medication Sig Start Date End Date Taking? Authorizing Provider  amoxicillin-clavulanate (AUGMENTIN) 875-125 MG tablet Take 1 tablet by mouth every 12 (twelve) hours for 10 days. 05/07/20 05/17/20 Yes Elson Areas, PA-C  albuterol (PROVENTIL HFA;VENTOLIN HFA) 108 (90 Base) MCG/ACT inhaler Inhale 2 puffs into the lungs every 6 (six) hours as needed for wheezing or shortness of breath. 07/06/18   Shon Hale, MD  Blood Pressure Monitor DEVI 1 Device by Does not apply route daily. 01/22/20   Dollene Cleveland, DO  cetirizine (ZYRTEC) 10 MG tablet Take 1 tablet (10 mg total) by mouth daily. 08/10/19   Dollene Cleveland, DO  fluconazole (DIFLUCAN) 150 MG tablet Take 1 tablet (150 mg total) by mouth every 3 (three) days. 03/26/20   Allayne Stack, DO   levothyroxine (SYNTHROID) 137 MCG tablet Take 1 tablet (137 mcg total) by mouth daily before breakfast. 05/02/20   Dollene Cleveland, DO  losartan-hydrochlorothiazide (HYZAAR) 100-25 MG tablet Take 1 tablet by mouth daily. 01/23/20   Dollene Cleveland, DO    Allergies    Shellfish allergy  Review of Systems   Review of Systems  HENT: Positive for ear pain.   All other systems reviewed and are negative.   Physical Exam Updated Vital Signs BP (!) 129/95 (BP Location: Left Arm)   Pulse 74   Temp 98.7 F (37.1 C) (Oral)   Resp 20   Ht 5\' 7"  (1.702 m)   Wt 107.5 kg   LMP 04/14/2020   SpO2 100%   BMI 37.12 kg/m   Physical Exam Vitals and nursing note reviewed.  Constitutional:      Appearance: She is well-developed and well-nourished.  HENT:     Head: Normocephalic.     Comments: Tender maxilary sinuses    Right Ear: Tympanic membrane normal.     Left Ear: Tympanic membrane normal.     Nose: Nose normal.     Mouth/Throat:     Mouth: Mucous membranes are moist.  Eyes:     Extraocular Movements: EOM normal.     Pupils: Pupils are equal, round, and reactive to light.  Cardiovascular:     Rate and Rhythm: Normal rate and regular rhythm.  Pulmonary:     Effort: Pulmonary effort is normal.  Abdominal:     General: There is no distension.  Musculoskeletal:        General: Normal range of motion.     Cervical back: Normal range of motion.  Neurological:     General: No focal deficit present.     Mental Status: She is alert and oriented to person, place, and time.  Psychiatric:        Mood and Affect: Mood and affect and mood normal.     ED Results / Procedures / Treatments   Labs (all labs ordered are listed, but only abnormal results are displayed) Labs Reviewed  SARS CORONAVIRUS 2 (TAT 6-24 HRS)    EKG None  Radiology No results found.  Procedures Procedures (including critical care time)  Medications Ordered in ED Medications - No data to  display  ED Course  I have reviewed the triage vital signs and the nursing notes.  Pertinent labs & imaging results that were available during my care of the patient were reviewed by me and considered in my medical decision making (see  chart for details).    MDM Rules/Calculators/A&P                         Covid ordered,  I will treat for sinus infection.  Final Clinical Impression(s) / ED Diagnoses Final diagnoses:  Acute maxillary sinusitis, recurrence not specified    Rx / DC Orders ED Discharge Orders         Ordered    amoxicillin-clavulanate (AUGMENTIN) 875-125 MG tablet  Every 12 hours        05/07/20 1423        An After Visit Summary was printed and given to the patient.    Osie Cheeks 05/08/20 Loman Chroman, MD 05/08/20 [id number]

## 2020-05-08 LAB — SARS CORONAVIRUS 2 (TAT 6-24 HRS): SARS Coronavirus 2: NEGATIVE

## 2020-05-11 NOTE — Progress Notes (Signed)
Subjective:   Patient ID: [name]    DOB: 1985/09/28, 35 y.o. female   MRN: [id number]  [name] is a 35 y.o. female with a history of HTN, asthma, acid reflux, hypothyrodism, ADHD, h/o nonintractable headache, seasonal allergies here for headache and cough.  Headache  Cough:  She was seen on 05/07/20 by Dr. Neita Garnet via telemedicine visit for severe left sided headache with associated facial edema felt to be secondary to viral illness. She has associated sinus congestion and pressure. There was also some concern for sinus infection/abscess. She was seen in ED that same day and diagnosed with sinus infection and treated with Augmentin BID. Her COVID test at that time was negative. Today she notes that she has had minimal improvement with 800mg  Ibuprofen or the Augmentin. She notes the headache feels like worsening pressure buildup. She is starting to have ear pain and neck pain. Denies any photophobia/phonophobia. Denies fevers/chills. She notes the jaw pain has improved. When she yawns, it feels like the pressure increases. She has also started to develop numbness and tingling in her neck at times as well. She does endorse runny nose and watery eyes with thick crust in the morning. She has not tried mucinex, cold/flu medicine, or flonase.   Review of Systems:  Per HPI.   Objective:   BP 135/75   Pulse 75   LMP 04/14/2020   SpO2 100%  Vitals and nursing note reviewed.  General: pleasant younger female, sitting comfortably in exam chair, well nourished, well developed, in no acute distress with non-toxic appearance HEENT: normocephalic, atraumatic, EOMI, PERRL, moist mucous membranes, swollen turbinates bilaterally, oropharynx clear without erythema or exudate, no signs of dental infection or significant dental cares, mild erythema surrounding left TM otherwise TM normal, no sinus pain to palpation, no erythema or pain to palpation along mastoid Neck: supple, no  lymphadenopathy, some tenderness along lateral-posterior aspect of left side of neck  CV: regular rate and rhythm without murmurs, rubs, or gallops Lungs: clear to auscultation bilaterally with normal work of breathing on room air, speaking in full sentences Skin: warm, dry Extremities: warm and well perfused, normal tone and strength UE and LE bilaterally MSK: gait normal Neuro: Alert and oriented, speech normal, CN III-XII intact, strength UE and LE intact, PERRL, EOMI without pain   Assessment & Plan:   Left-sided headache Acute. Unclear etiology. COVID negative. S/p Augmentin BID without any improvement in left sided head pain. Signs of swollen turbinates and cobblestoning on exam with rhinorrhea and epiphora make sinus etiology appear most likely etiology however lack of improvement with augmentin makes this cause less clear. Benign neuro exam makes intracranial process less likely, however still concerning given severity and lack of improvement with antibiotics and OTC analgesics over the course of 7 days. Considered migraine, tension, cluster headache, trigeminal neuralgia however symptoms do no appear consistent at this time. Although rare and doesn't seem to last as long as patient's symptoms, Paroxysmal hemicrania can present similarly (female, mid 30's, severe unilateral pain that is centered around orbital, retroorbital, temporal, and frontal regions and can involve the occipital region, maxillary region, neck, ear, and teeth and sometimes involves lacrimation, conjunctival injection, rhinorrhea, nasal congestion, ptosis, ear pain or facial flushing).  Discussed management options with patient and with shared decision making opted to focus treatment on symptomatic treatment for 2-3 days. If no improvement, consider obtaining MRI with/without IV contrast. At this time there is low suspicion for intracranial bleed to warrant CT  head without contrast.  - Start Afrin nasal spray twice a day for  3 days - start Flonase QD  - Start Mucinex as needed for congestion - Continue allergy medicine - trial Flexeril 10mg  TID PRN. Cautioned use given risk of sedation. Patient voiced understanding and agreement.  - Trial Meloxicam 15mg  QD x 14 days. Patient was encouraged to avoid other NSAIDs like Ibuprofen/Advil/Naproxen while taking this. Can use tylenol 650mg  for breakthrough pain.  - ED precautions discussed  - can consider therapeutic trial of oral indomethacin while awaiting imaging (25mg  TID, increase to 50mg  TID if no/inadequate response in 3 days) to rule out active paroxysmal hemicrania  No orders of the defined types were placed in this encounter.  Meds ordered this encounter  Medications  . fluticasone (FLONASE) 50 MCG/ACT nasal spray    Sig: Place 2 sprays into both nostrils daily.    Dispense:  16 g    Refill:  6  . oxymetazoline (AFRIN NASAL SPRAY) 0.05 % nasal spray    Sig: Place 1 spray into both nostrils 2 (two) times daily for 3 days.    Dispense:  15 mL    Refill:  0  . cyclobenzaprine (FLEXERIL) 10 MG tablet    Sig: Take 1 tablet (10 mg total) by mouth 3 (three) times daily as needed for muscle spasms.    Dispense:  30 tablet    Refill:  0  . meloxicam (MOBIC) 15 MG tablet    Sig: Take 1 tablet (15 mg total) by mouth daily for 14 days.    Dispense:  14 tablet    Refill:  0   Preceptor: Dr. , MD  , DO PGY-3, [facility] 05/12/2020 8:26 PM

## 2020-05-12 ENCOUNTER — Ambulatory Visit (INDEPENDENT_AMBULATORY_CARE_PROVIDER_SITE_OTHER): Payer: Medicaid Other | Admitting: Family Medicine

## 2020-05-12 ENCOUNTER — Other Ambulatory Visit: Payer: Self-pay

## 2020-05-12 VITALS — BP 135/75 | HR 75

## 2020-05-12 DIAGNOSIS — R519 Headache, unspecified: Secondary | ICD-10-CM

## 2020-05-12 MED ORDER — CYCLOBENZAPRINE HCL 10 MG PO TABS: 10.0000 mg | ORAL_TABLET | Freq: Three times a day (TID) | ORAL | 0 refills | Status: DC | PRN

## 2020-05-12 MED ORDER — MELOXICAM 15 MG PO TABS
15.0000 mg | ORAL_TABLET | Freq: Every day | ORAL | 0 refills | Status: AC
Start: 2020-05-12 — End: 2020-05-26

## 2020-05-12 MED ORDER — AFRIN NASAL SPRAY 0.05 % NA SOLN: 1.0000 | Freq: Two times a day (BID) | NASAL | 0 refills | Status: AC

## 2020-05-12 MED ORDER — FLUTICASONE PROPIONATE 50 MCG/ACT NA SUSP: 2.0000 | Freq: Every day | NASAL | 6 refills | Status: DC

## 2020-05-12 NOTE — Patient Instructions (Signed)
It was a pleasure to see you today!  Thank you for choosing [facility] for your primary care.   Our plans for today were:  Start Afrin nasal spray twice a day for 3 days. You can also do Flonase once a day every day.  Start Mucinex as needed for congestion. Continue your allergy medicine  I am giving you a muscle relaxer called Flexeril. You can take this up to 3 times a day as needed. 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 Avoid other NSAIDs like Ibuprofen/Advil/Naproxen while taking this. You can use tylenol 650mg  for breakthrough pain.   Let me know if no improvement or worsening within 2-3 days and we will plan to obtain imaging.  Finish up the Augmentin as prescribed  Best Wishes,   , DO

## 2020-05-12 NOTE — Assessment & Plan Note (Addendum)
Acute. Unclear etiology. COVID negative. S/p Augmentin BID without any improvement in left sided head pain. Signs of swollen turbinates and cobblestoning on exam with rhinorrhea and epiphora make sinus etiology appear most likely etiology however lack of improvement with augmentin makes this cause less clear. Benign neuro exam makes intracranial process less likely, however still concerning given severity and lack of improvement with antibiotics and OTC analgesics over the course of 7 days. Considered migraine, tension, cluster headache, trigeminal neuralgia however symptoms do no appear consistent at this time. Although rare and doesn't seem to last as long as patient's symptoms, Paroxysmal hemicrania can present similarly (female, mid 30's, severe unilateral pain that is centered around orbital, retroorbital, temporal, and frontal regions and can involve the occipital region, maxillary region, neck, ear, and teeth and sometimes involves lacrimation, conjunctival injection, rhinorrhea, nasal congestion, ptosis, ear pain or facial flushing).  Discussed management options with patient and with shared decision making opted to focus treatment on symptomatic treatment for 2-3 days. If no improvement, consider obtaining MRI with/without IV contrast. At this time there is low suspicion for intracranial bleed to warrant CT head without contrast.  - Start Afrin nasal spray twice a day for 3 days - start Flonase QD  - Start Mucinex as needed for congestion - Continue allergy medicine - trial Flexeril 10mg  TID PRN. Cautioned use given risk of sedation. Patient voiced understanding and agreement.  - Trial Meloxicam 15mg  QD x 14 days. Patient was encouraged to avoid other NSAIDs like Ibuprofen/Advil/Naproxen while taking this. Can use tylenol 650mg  for breakthrough pain.  - ED precautions discussed  - can consider therapeutic trial of oral indomethacin while awaiting imaging (25mg  TID, increase to 50mg  TID if  no/inadequate response in 3 days) to rule out active paroxysmal hemicrania

## 2020-07-14 ENCOUNTER — Other Ambulatory Visit: Payer: Self-pay | Admitting: Family Medicine

## 2020-07-14 DIAGNOSIS — E039 Hypothyroidism, unspecified: Secondary | ICD-10-CM

## 2020-08-13 ENCOUNTER — Other Ambulatory Visit: Payer: Self-pay | Admitting: Family Medicine

## 2020-08-13 DIAGNOSIS — J302 Other seasonal allergic rhinitis: Secondary | ICD-10-CM

## 2020-10-01 ENCOUNTER — Other Ambulatory Visit: Payer: Self-pay | Admitting: Family Medicine

## 2020-10-01 DIAGNOSIS — E039 Hypothyroidism, unspecified: Secondary | ICD-10-CM

## 2021-02-23 ENCOUNTER — Other Ambulatory Visit: Payer: Self-pay | Admitting: Family Medicine

## 2021-04-23 IMAGING — DX DG FINGER THUMB 2+V*L*
3 series · 3 of 3 positions shown · non-contrast
Comparison: None.

CLINICAL DATA: Pain

EXAM:
LEFT THUMB 2+V

[dg finger thumb left (1 of 3)]
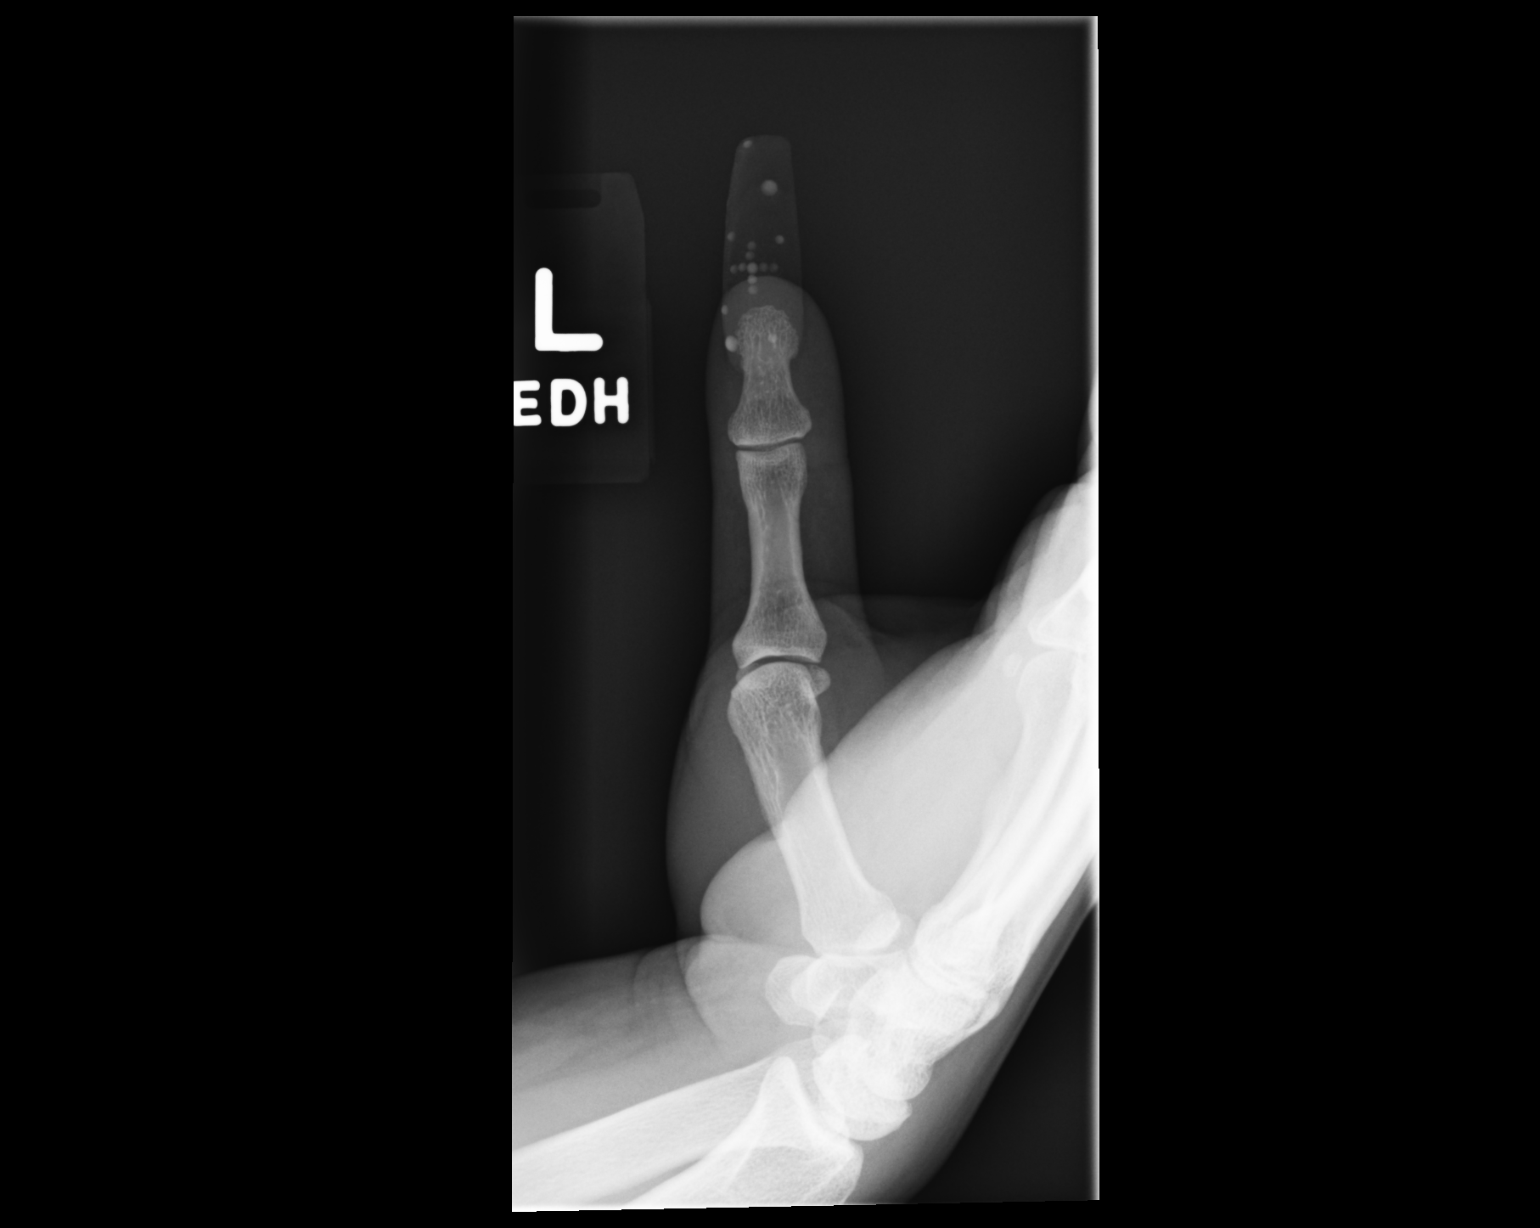

[dg finger thumb left (2 of 3)]
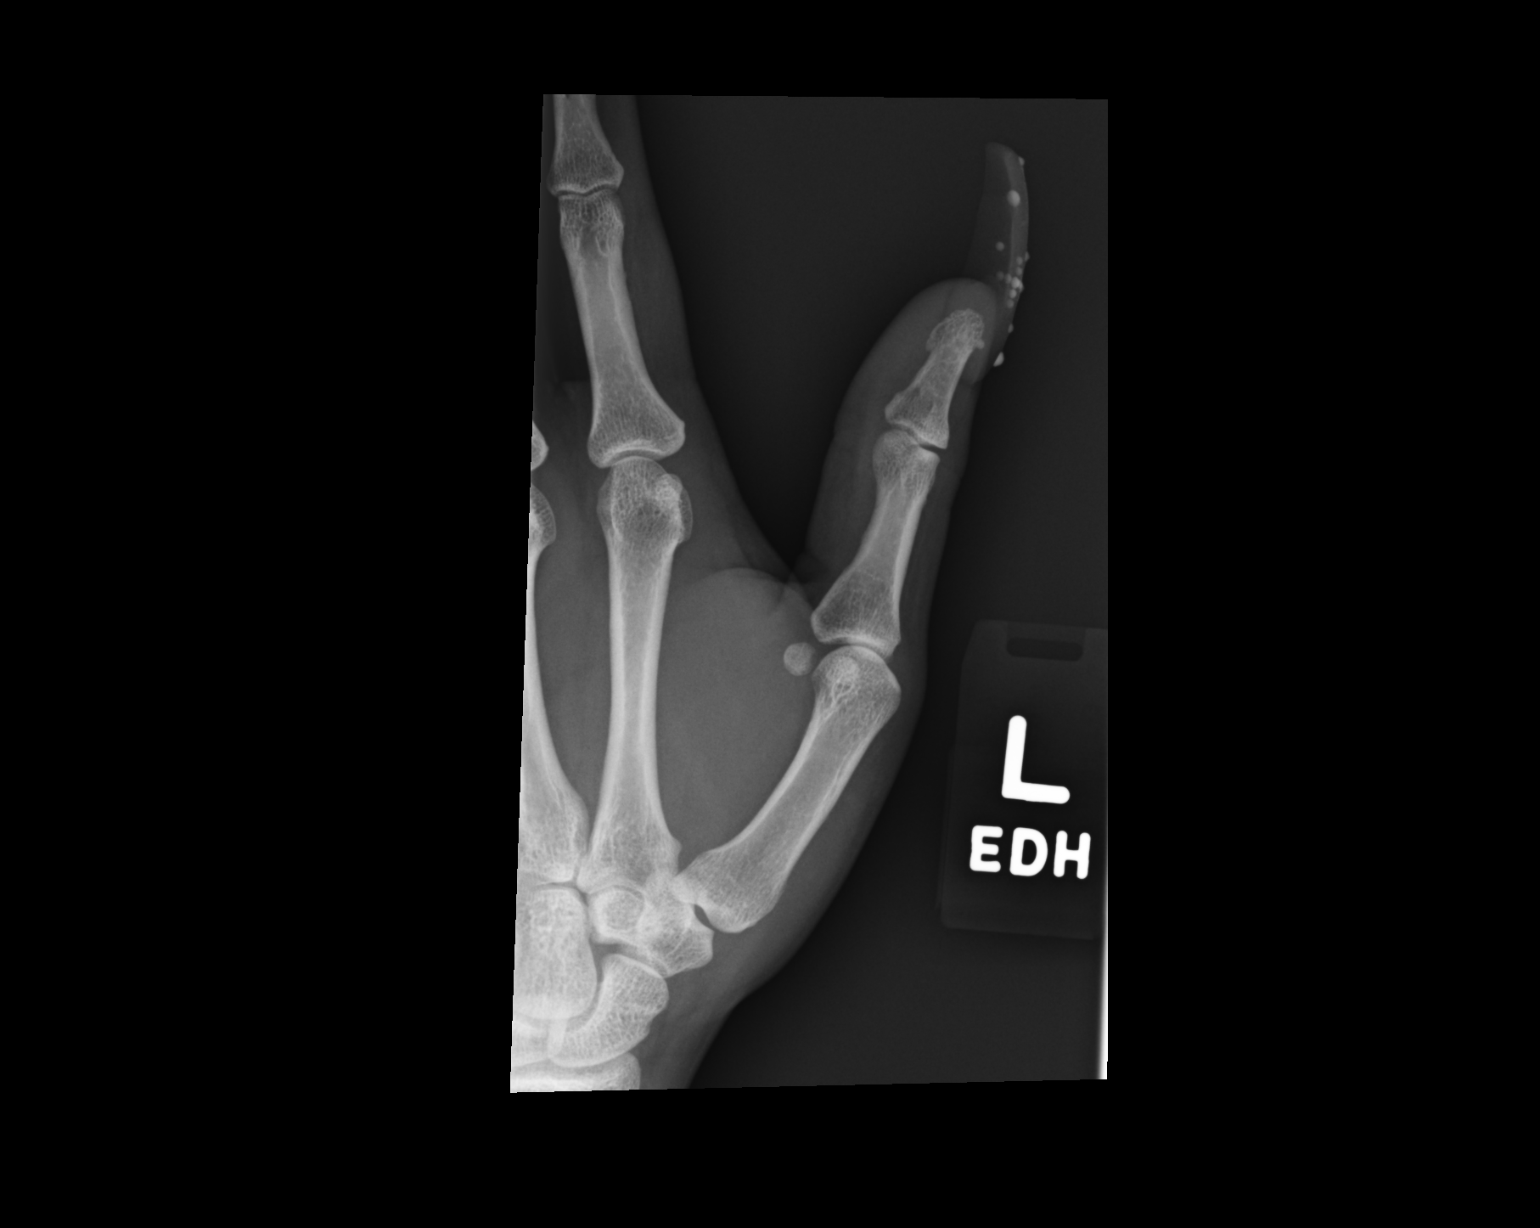

[dg finger thumb left (3 of 3)]
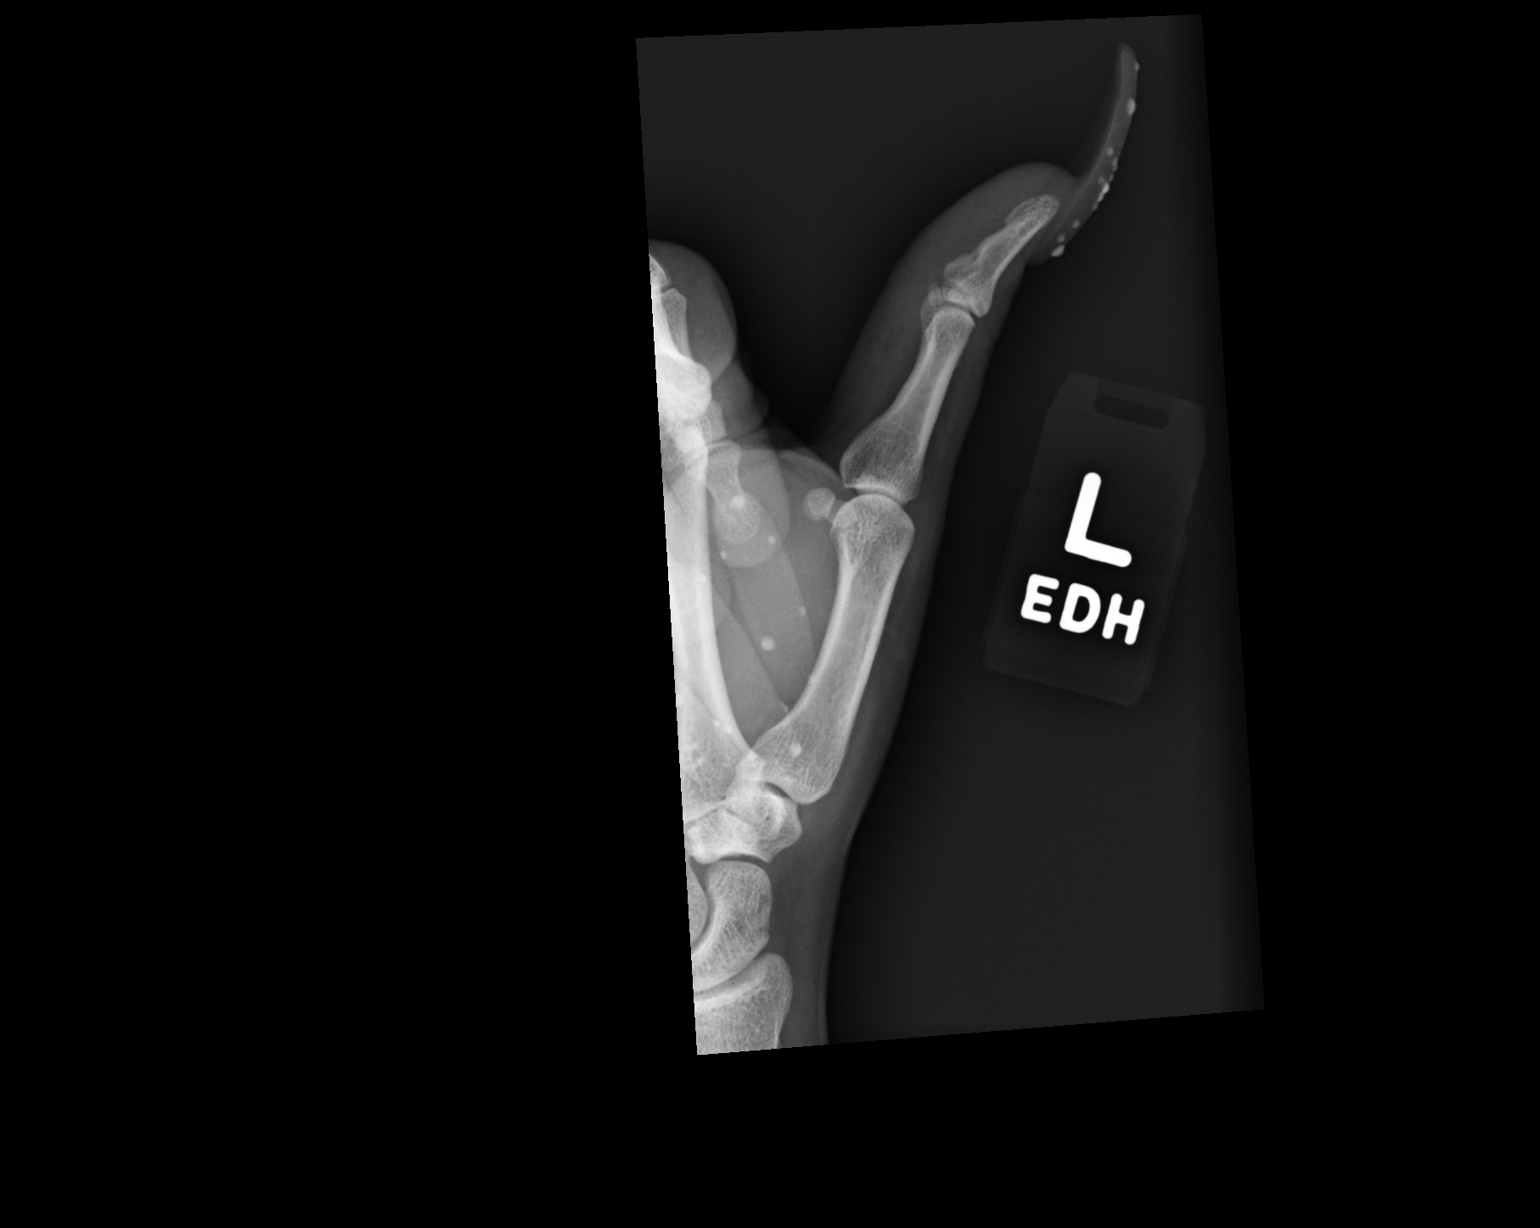

[3 of 3 positions shown; findings below may reference images not displayed]

FINDINGS: There is no evidence of fracture or dislocation. There is no
evidence of arthropathy or other focal bone abnormality. Soft
tissues are unremarkable.
IMPRESSION: Negative.

## 2021-04-29 ENCOUNTER — Ambulatory Visit: Payer: Medicaid Other | Admitting: Family Medicine

## 2021-05-21 ENCOUNTER — Other Ambulatory Visit: Payer: Self-pay

## 2021-05-21 ENCOUNTER — Ambulatory Visit (INDEPENDENT_AMBULATORY_CARE_PROVIDER_SITE_OTHER): Payer: Medicaid Other | Admitting: Family Medicine

## 2021-05-21 VITALS — BP 132/84 | HR 100 | Wt 255.4 lb

## 2021-05-21 DIAGNOSIS — E039 Hypothyroidism, unspecified: Secondary | ICD-10-CM

## 2021-05-21 DIAGNOSIS — Z569 Unspecified problems related to employment: Secondary | ICD-10-CM

## 2021-05-21 NOTE — Assessment & Plan Note (Signed)
Last TSH in January 2022 was 5.270. Unfortunately patient has been off her Synthroid for the past few months. Therefore, no utility in repeat TSH today. Encouraged compliance going forward and patient was agreeable. Will recheck TSH in 6 weeks.

## 2021-05-21 NOTE — Progress Notes (Addendum)
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 She states her job requires a fair amount of physical activity and wearing jeans makes it difficult to bend, twist, and perform her work duties properly. She frequently develops chafing in her inner thighs and a rash around her waist when she does try to wear jeans.  Patient spoke with her boss and he advised she could only be exempt from the rule if she obtained a doctor's note. She loves her job and does not want to lose it simply over the dress code.  Hypothyroidism Patient with a history of hypothyroidism. Last TSH 5.270 on 04/22/20 which was greatly improved from prior value of 68.970 in March 2020. Patient prescribed Synthroid 144mcg daily, however has had some difficulty with compliance. She stopped taking it over the past 3 months. Just recently picked up a refill a few days ago and plans to resume.  PERTINENT  PMH / PSH: hypothyroidism, HTN  OBJECTIVE:   BP 132/84    Pulse 100    Wt 255 lb 6.4 oz (115.8 kg)    SpO2 98%    BMI 40.00 kg/m   General: NAD, pleasant, able to participate in exam Respiratory: No respiratory distress Skin: warm and dry, no rashes noted Psych: Normal affect and mood Neuro: grossly intact   ASSESSMENT/PLAN:   Work Engineer, materials Patient given work note allowing her to wear alternate clothing aside from jeans.  Hypothyroidism Last TSH in January 2022 was 5.270. Unfortunately patient has been off her Synthroid for the past few months. Therefore, no utility in repeat TSH today. Encouraged compliance going forward and patient was agreeable. Will recheck TSH in 6 weeks.   Alcus Dad, MD [place]

## 2021-05-21 NOTE — Patient Instructions (Signed)
It was great to meet you!  Please take your thyroid medication EVERY morning. We're all human and forget sometimes, but I think you'll notice that you feel better if you take it consistently. Try setting an alarm or writing some kind of reminder on your fridge/front door/bathroom mirror. A pill box for each day of the week can also help keep you accountable. We will recheck your thyroid levels in 4-6 weeks.  Best of luck with your job.  Take care, Dr Anner Crete

## 2021-06-15 ENCOUNTER — Other Ambulatory Visit: Payer: Self-pay | Admitting: Family Medicine

## 2021-06-16 ENCOUNTER — Other Ambulatory Visit: Payer: Self-pay | Admitting: Family Medicine

## 2021-06-22 ENCOUNTER — Other Ambulatory Visit: Payer: Self-pay

## 2021-06-22 DIAGNOSIS — R519 Headache, unspecified: Secondary | ICD-10-CM

## 2021-06-22 DIAGNOSIS — E039 Hypothyroidism, unspecified: Secondary | ICD-10-CM

## 2021-06-22 MED ORDER — LEVOTHYROXINE SODIUM 137 MCG PO TABS: ORAL_TABLET | ORAL | 1 refills | Status: DC

## 2021-06-22 MED ORDER — FLUTICASONE PROPIONATE 50 MCG/ACT NA SUSP
2.0000 | Freq: Every day | NASAL | 6 refills | Status: DC
Start: 2021-06-22 — End: 2023-03-29

## 2021-07-03 ENCOUNTER — Ambulatory Visit: Payer: Medicaid Other | Admitting: Family Medicine

## 2021-07-13 ENCOUNTER — Other Ambulatory Visit: Payer: Self-pay | Admitting: Family Medicine

## 2021-07-13 ENCOUNTER — Telehealth: Payer: Self-pay | Admitting: Family Medicine

## 2021-07-13 DIAGNOSIS — E039 Hypothyroidism, unspecified: Secondary | ICD-10-CM

## 2021-07-13 MED ORDER — LEVOTHYROXINE SODIUM 137 MCG PO TABS: ORAL_TABLET | ORAL | 0 refills | Status: DC

## 2021-07-13 NOTE — Telephone Encounter (Signed)
Patient came in asking to get her Synthroid refilled. She sates that she is completely out, and wants to know if she can get a 90 day supply. ?

## 2021-07-22 ENCOUNTER — Other Ambulatory Visit: Payer: Self-pay

## 2021-07-22 DIAGNOSIS — E039 Hypothyroidism, unspecified: Secondary | ICD-10-CM

## 2021-07-27 ENCOUNTER — Telehealth: Payer: Self-pay

## 2021-07-27 DIAGNOSIS — E039 Hypothyroidism, unspecified: Secondary | ICD-10-CM

## 2021-07-27 MED ORDER — LEVOTHYROXINE SODIUM 137 MCG PO TABS: ORAL_TABLET | ORAL | 0 refills | Status: DC

## 2021-07-27 NOTE — Telephone Encounter (Signed)
Patient calls nurse line upset Levothyroxine has not been called into the pharmacy yet.  ? ?Medication was sent in on 3/27, however was set to print.  ? ?Will send in now.  ?

## 2021-08-05 ENCOUNTER — Other Ambulatory Visit: Payer: Self-pay

## 2021-08-05 DIAGNOSIS — E039 Hypothyroidism, unspecified: Secondary | ICD-10-CM

## 2021-08-05 DIAGNOSIS — J302 Other seasonal allergic rhinitis: Secondary | ICD-10-CM

## 2021-08-06 MED ORDER — LEVOTHYROXINE SODIUM 137 MCG PO TABS
ORAL_TABLET | ORAL | 0 refills | Status: DC
Start: 2021-08-06 — End: 2021-10-23

## 2021-08-06 MED ORDER — LOSARTAN POTASSIUM-HCTZ 100-25 MG PO TABS
1.0000 | ORAL_TABLET | Freq: Every day | ORAL | 0 refills | Status: DC
Start: 2021-08-06 — End: 2021-09-17

## 2021-08-06 MED ORDER — CETIRIZINE HCL 10 MG PO TABS: ORAL_TABLET | ORAL | 11 refills | Status: DC

## 2021-09-17 ENCOUNTER — Other Ambulatory Visit: Payer: Self-pay | Admitting: Family Medicine

## 2021-09-22 ENCOUNTER — Encounter: Payer: Self-pay | Admitting: *Deleted

## 2021-10-21 ENCOUNTER — Other Ambulatory Visit: Payer: Self-pay | Admitting: Family Medicine

## 2021-10-21 DIAGNOSIS — E039 Hypothyroidism, unspecified: Secondary | ICD-10-CM

## 2021-11-27 ENCOUNTER — Ambulatory Visit (INDEPENDENT_AMBULATORY_CARE_PROVIDER_SITE_OTHER): Payer: Medicaid Other | Admitting: Family Medicine

## 2021-11-27 ENCOUNTER — Encounter: Payer: Self-pay | Admitting: Family Medicine

## 2021-11-27 VITALS — BP 122/87 | HR 94 | Ht 67.0 in | Wt 246.4 lb

## 2021-11-27 DIAGNOSIS — Z111 Encounter for screening for respiratory tuberculosis: Secondary | ICD-10-CM | POA: Diagnosis not present

## 2021-11-27 DIAGNOSIS — Z6838 Body mass index (BMI) 38.0-38.9, adult: Secondary | ICD-10-CM

## 2021-11-27 DIAGNOSIS — E039 Hypothyroidism, unspecified: Secondary | ICD-10-CM | POA: Diagnosis not present

## 2021-11-27 DIAGNOSIS — Z23 Encounter for immunization: Secondary | ICD-10-CM

## 2021-11-27 DIAGNOSIS — I10 Essential (primary) hypertension: Secondary | ICD-10-CM

## 2021-11-27 NOTE — Progress Notes (Signed)
    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 Denies chest pain, dyspnea and leg swelling. Complaint on hyzaar daily, tolerating medication well. Takes synthroid every morning, due for TSH. Wants to work on her weight, she has been doing a lot of walking. She has some aerobic routines that she tries. Eats grilled chicken salads for lunch, she has tried juicing and drinking smoothies. She has been trying to cut down on sugar drinks, only has been drinking water over the past week.   OBJECTIVE:   BP 122/87   Pulse 94   Ht 5\' 7"  (1.702 m)   Wt 246 lb 6 oz (111.8 kg)   LMP 11/23/2021   SpO2 99%   BMI 38.59 kg/m   General: Patient well-appearing, in no acute distress. HEENT: PERRLA, non-tender thyroid, no evidence of cervical LAD CV: RRR, no murmurs or gallops auscultated Resp: CTAB, no wheezing, rales or rhonchi noted  Abdomen: soft, nontender, nondistended, presence of bowel sounds Ext: no LE edema noted bilaterally, radial pulses strong and equal bilaterally Psych: mood appropriate   ASSESSMENT/PLAN:   Hypertension -BP 122/87, controlled -continue hyzaar -pending BMP -diet and exercise counseling provided  -patient intrinsically motivated to improve and make lifestyle changes, referral placed to healthy weight and wellness   Hypothyroidism -pending TSH -continue synthroid   Health maintenance -Med rec reviewed and appropriately updated -Tdap vaccine administered today -Quantiferon Gold pending for work purposes   -PHQ-9 score of 7 with negative question 9 reviewed.   01/23/2022, DO [facility]

## 2021-11-27 NOTE — Patient Instructions (Addendum)
It was great seeing you today!  Today we discussed many things. Your blood pressure looks great, keep taking your hyzaar. We will check your kidney function, sodium and potassium today. We will also check your thyroid, please continue to take synthroid. I will let you know of any abnormal results from the blood work today.  I have placed a referral to healthy weight and wellness, in the meantime, please eat a balanced diet and exercise at least 90 minutes week (or stay active throughout the day).   Please follow up at your next scheduled appointment, if anything arises between now and then, please don't hesitate to contact our office.   Thank you for allowing [place] to be a part of your medical care!  Thank you, Dr. Robyne Peers

## 2021-11-27 NOTE — Assessment & Plan Note (Signed)
-  BP 122/87, controlled -continue hyzaar -pending BMP -diet and exercise counseling provided  -patient intrinsically motivated to improve and make lifestyle changes, referral placed to healthy weight and wellness

## 2021-11-27 NOTE — Assessment & Plan Note (Signed)
-  pending TSH -continue synthroid

## 2021-11-30 ENCOUNTER — Other Ambulatory Visit: Payer: Self-pay | Admitting: Family Medicine

## 2021-11-30 ENCOUNTER — Encounter: Payer: Self-pay | Admitting: *Deleted

## 2021-11-30 DIAGNOSIS — E039 Hypothyroidism, unspecified: Secondary | ICD-10-CM

## 2021-11-30 MED ORDER — LEVOTHYROXINE SODIUM 150 MCG PO TABS: 150.0000 ug | ORAL_TABLET | ORAL | 3 refills | Status: DC

## 2021-11-30 NOTE — Telephone Encounter (Signed)
Error. Reshma Hoey, CMA  

## 2021-12-02 LAB — BASIC METABOLIC PANEL
BUN/Creatinine Ratio: 11 (ref 9–23)
BUN: 10 mg/dL (ref 6–20)
CO2: 24 mmol/L (ref 20–29)
Calcium: 9.3 mg/dL (ref 8.7–10.2)
Chloride: 98 mmol/L (ref 96–106)
Creatinine, Ser: 0.88 mg/dL (ref 0.57–1.00)
Glucose: 75 mg/dL (ref 70–99)
Potassium: 3.4 mmol/L — ABNORMAL LOW (ref 3.5–5.2)
Sodium: 137 mmol/L (ref 134–144)
eGFR: 88 mL/min/{1.73_m2} (ref >59)

## 2021-12-02 LAB — QUANTIFERON-TB GOLD PLUS
QuantiFERON Mitogen Value: >10 IU/mL
QuantiFERON Nil Value: 0.07 IU/mL
QuantiFERON TB1 Ag Value: 0.05 IU/mL
QuantiFERON TB2 Ag Value: 0.05 IU/mL
QuantiFERON-TB Gold Plus: NEGATIVE

## 2021-12-02 LAB — TSH: TSH: 12.1 u[IU]/mL — ABNORMAL HIGH (ref 0.450–4.500)

## 2021-12-07 ENCOUNTER — Telehealth: Payer: Self-pay | Admitting: Family Medicine

## 2021-12-07 NOTE — Telephone Encounter (Signed)
Patient dropped off form at front desk for Health Examination Certificate.  Verified that patient section of form has been completed.  Last DOS/WCC with PCP was 11/27/21.  Placed form in blue team folder to be completed by clinical staff.  [name]   Patient indicated urgent for job hiring.

## 2021-12-10 NOTE — Telephone Encounter (Signed)
Forms where faxed today. Aquilla Solian, CMA

## 2022-01-07 ENCOUNTER — Other Ambulatory Visit: Payer: Self-pay | Admitting: Family Medicine

## 2022-01-07 DIAGNOSIS — E039 Hypothyroidism, unspecified: Secondary | ICD-10-CM

## 2022-01-26 ENCOUNTER — Other Ambulatory Visit: Payer: Self-pay | Admitting: Family Medicine

## 2022-03-01 ENCOUNTER — Ambulatory Visit
Admission: EM | Admit: 2022-03-01 | Discharge: 2022-03-01 | Disposition: A | Discharge status: Home / Self Care | Payer: Medicaid Other | Attending: Urgent Care | Admitting: Urgent Care

## 2022-03-01 DIAGNOSIS — S39012A Strain of muscle, fascia and tendon of lower back, initial encounter: Secondary | ICD-10-CM

## 2022-03-01 MED ORDER — TIZANIDINE HCL 4 MG PO TABS: 4.0000 mg | ORAL_TABLET | Freq: Four times a day (QID) | ORAL | 0 refills | Status: DC | PRN

## 2022-03-01 MED ORDER — NAPROXEN SODIUM 550 MG PO TABS: 550.0000 mg | ORAL_TABLET | Freq: Two times a day (BID) | ORAL | 0 refills | Status: DC

## 2022-03-01 NOTE — ED Provider Notes (Signed)
[facility]    CSN: [id number] Arrival date & time: 03/01/22  0906      History   Chief Complaint Chief Complaint  Patient presents with   Back Pain    While cleaning up yesterday I bent down to sweep up the trash and heard a pop on the way up and when I layer down and tried to get up after watching a movie my back was hurting so bad - Entered by patient    HPI [name] is a 36 y.o. female.   Pleasant 36 year old female presents today due to a 1 day history of lower back pain.  She states she was at home yesterday trying to sweep up the floor, and stood up abruptly.  During standing up, she heard a pop in her lower back.  States since then, she has had midline and bilateral lower back pain.  Took half a tablet of a muscle relaxer from her mom yesterday which did not help.  She is unaware of the name of this medication.  She also tried an anti-inflammatory without significant improvement.  She denies any chronic or recurrent back issues.  Denies any change in bowel or bladder habits, no numbness or tingling going down her extremities.  Denies saddle anesthesia. No radiation of pain. No fever.   Back Pain   Past Medical History:  Diagnosis Date   Anemia    all 3 pregnancies   Asthma    albuterol inhaler, last used one year ago   Bipolar disorder (HCC)    Cerumen impaction 08/20/2019   Gestational hypertension 2010   with 3rd pregnancy   Grave's disease    Hyperemesis    3rd pregnancy   No-show for appointment 08/16/2019   Postpartum thyroiditis 03/02/2011   Preterm labor    with 3rd pregnancy   Thyroid disease    had radioactive iodine   Well woman exam with routine gynecological exam 08/10/2019    Patient Active Problem List   Diagnosis Date Noted   Left-sided headache 04/22/2020   Thumb pain 01/28/2020   Acid reflux 01/28/2020   Depression 08/18/2014   ADHD, adult residual type 08/18/2014   Seasonal allergies 07/08/2014   Hypothyroidism  07/03/2014   Mood disorder in conditions classified elsewhere 07/13/2012   Hypertension 07/06/2012   Asthma 12/16/2010    Past Surgical History:  Procedure Laterality Date   DILATION AND CURETTAGE OF UTERUS  2010   LAPAROSCOPIC TUBAL LIGATION  08/10/2011   Procedure: LAPAROSCOPIC TUBAL LIGATION;  Surgeon: Tereso Newcomer, MD;  Location: [facility];  Service: Gynecology;  Laterality: Bilateral;    OB History     Gravida  5   Para  4   Term  3   Preterm  1   AB  1   Living  4      SAB  1   IAB      Ectopic      Multiple      Live Births  4            Home Medications    Prior to Admission medications   Medication Sig Start Date End Date Taking? Authorizing Provider  naproxen sodium (ANAPROX DS) 550 MG tablet Take 1 tablet (550 mg total) by mouth 2 (two) times daily with a meal. 03/01/22  Yes Lalana Wachter L, PA  tiZANidine (ZANAFLEX) 4 MG tablet Take 1 tablet (4 mg total) by mouth every 6 (six) hours as needed for muscle  spasms. 03/01/22  Yes Charlett Merkle L, PA  Blood Pressure Monitor DEVI 1 Device by Does not apply route daily. 01/22/20   Dollene Cleveland, DO  cetirizine (ZYRTEC) 10 MG tablet TAKE 1 TABLET(10 MG) BY MOUTH DAILY 08/06/21   Ganta, Anupa, DO  fluticasone (FLONASE) 50 MCG/ACT nasal spray Place 2 sprays into both nostrils daily. Patient not taking: Reported on 11/27/2021 06/22/21   Reece Leader, DO  levothyroxine (SYNTHROID) 150 MCG tablet Take 1 tablet (150 mcg total) by mouth every morning. 30 minutes before food 11/30/21   Ganta, Anupa, DO  losartan-hydrochlorothiazide (HYZAAR) 100-25 MG tablet TAKE 1 TABLET BY MOUTH DAILY 01/27/22   Reece Leader, DO    Family History Family History  Problem Relation Age of Onset   Hypertension Father    Hypertension Mother    Anemia Mother    Bipolar disorder Mother    Thyroid disease Maternal Aunt    Bipolar disorder Maternal Aunt    Diabetes Maternal Uncle    Diabetes Maternal Grandmother    Diabetes  Paternal Grandmother    Cancer Paternal Grandmother    Anesthesia problems Neg Hx    Hypotension Neg Hx    Malignant hyperthermia Neg Hx    Pseudochol deficiency Neg Hx     Social History Social History   Tobacco Use   Smoking status: Former    Packs/day: 0.25    Years: 0.50    Total pack years: 0.13    Types: Cigarettes    Quit date: 11/18/2011    Years since quitting: 10.2   Smokeless tobacco: Never  Substance Use Topics   Alcohol use: No   Drug use: Yes    Types: Marijuana     Allergies   Shellfish allergy   Review of Systems Review of Systems  Musculoskeletal:  Positive for back pain.  As per HPI   Physical Exam Triage Vital Signs ED Triage Vitals  Enc Vitals Group     BP 03/01/22 1040 111/66     Pulse Rate 03/01/22 1039 80     Resp 03/01/22 1039 16     Temp 03/01/22 1039 98.3 F (36.8 C)     Temp Source 03/01/22 1039 Oral     SpO2 03/01/22 1039 98 %     Weight --      Height --      Head Circumference --      Peak Flow --      Pain Score 03/01/22 1040 8     Pain Loc --      Pain Edu? --      Excl. in [place]? --    No data found.  Updated Vital Signs BP 111/66   Pulse 80   Temp 98.3 F (36.8 C) (Oral)   Resp 16   SpO2 98%   Visual Acuity Right Eye Distance:   Left Eye Distance:   Bilateral Distance:    Right Eye Near:   Left Eye Near:    Bilateral Near:     Physical Exam Vitals and nursing note reviewed.  Constitutional:      General: She is not in acute distress.    Appearance: Normal appearance. She is obese. She is not ill-appearing, toxic-appearing or diaphoretic.  HENT:     Head: Normocephalic and atraumatic.     Nose: Nose normal.     Mouth/Throat:     Mouth: Mucous membranes are moist.  Eyes:     Extraocular Movements: Extraocular movements intact.  Conjunctiva/sclera: Conjunctivae normal.     Pupils: Pupils are equal, round, and reactive to light.  Cardiovascular:     Rate and Rhythm: Normal rate.  Pulmonary:      Effort: Pulmonary effort is normal. No respiratory distress.  Musculoskeletal:        General: Tenderness (reproducible tenderness to bilateral lower back to paraspinals between L1-5. No bony tenderness, no step off deformity) present. No swelling, deformity or signs of injury. Normal range of motion.     Cervical back: Normal range of motion and neck supple. No rigidity or tenderness.     Thoracic back: Normal. No spasms, tenderness or bony tenderness.     Lumbar back: Normal. No swelling, edema, deformity, signs of trauma, lacerations, spasms, tenderness or bony tenderness. Normal range of motion. Negative right straight leg raise test and negative left straight leg raise test. No scoliosis.     Right lower leg: No edema.     Left lower leg: No edema.     Comments: NO SI tenderness Negative SLR  Lymphadenopathy:     Cervical: No cervical adenopathy.  Skin:    General: Skin is warm and dry.     Capillary Refill: Capillary refill takes less than 2 seconds.     Coloration: Skin is not jaundiced.     Findings: No bruising, erythema or rash.  Neurological:     General: No focal deficit present.     Mental Status: She is alert and oriented to person, place, and time.     Sensory: No sensory deficit.     Motor: No weakness.     Coordination: Coordination normal.     Gait: Gait normal.     Deep Tendon Reflexes: Reflexes normal.      UC Treatments / Results  Labs (all labs ordered are listed, but only abnormal results are displayed) Labs Reviewed - No data to display  EKG   Radiology No results found.  Procedures Procedures (including critical care time)  Medications Ordered in UC Medications - No data to display  Initial Impression / Assessment and Plan / UC Course  I have reviewed the triage vital signs and the nursing notes.  Pertinent labs & imaging results that were available during my care of the patient were reviewed by me and considered in my medical decision making  (see chart for details).     Lumbar strain - no red flag s/sx, although discussed with pt at depth. Will do anaprox DS BID with food, tizanidine Q6 hrs PRN, side effects reviewed. No heavy lifting for two days, warm moist compresses to back. ER precautions reviewed   Final Clinical Impressions(s) / UC Diagnoses   Final diagnoses:  Strain of lumbar region, initial encounter     Discharge Instructions      You are suffering from a lumbar strain.  This is a painful stretching or partial tearing of the muscles around your lower spine. Please avoid heavy lifting for the next 2 days. Place a microwavable heating pack on your lower back 2-3 times daily for the next 2 days. Take the Anaprox twice daily with food. Take the tizanidine every 6 hours as needed for pain.  Use with caution as this may make you slightly drowsy. Return to clinic or head to the emergency room if you develop any numbness or tingling down your legs, or change in bowel or bladder function.    ED Prescriptions     Medication Sig Dispense Auth. Provider   naproxen  sodium (ANAPROX DS) 550 MG tablet Take 1 tablet (550 mg total) by mouth 2 (two) times daily with a meal. 20 tablet Jiyan Walkowski L, PA   tiZANidine (ZANAFLEX) 4 MG tablet Take 1 tablet (4 mg total) by mouth every 6 (six) hours as needed for muscle spasms. 20 tablet Zadia Uhde L, [place]      PDMP not reviewed this encounter.   Maretta BeesCrain, Blandon Offerdahl L, [place] 03/02/22 2134

## 2022-03-01 NOTE — Discharge Instructions (Addendum)
You are suffering from a lumbar strain.  This is a painful stretching or partial tearing of the muscles around your lower spine. Please avoid heavy lifting for the next 2 days. Place a microwavable heating pack on your lower back 2-3 times daily for the next 2 days. Take the Anaprox twice daily with food. Take the tizanidine every 6 hours as needed for pain.  Use with caution as this may make you slightly drowsy. Return to clinic or head to the emergency room if you develop any numbness or tingling down your legs, or change in bowel or bladder function.

## 2022-03-01 NOTE — ED Triage Notes (Signed)
Pt reports doing a lot of cleaning and bending over yesterday and has now injured her lower back.

## 2022-03-25 ENCOUNTER — Encounter: Payer: Self-pay | Admitting: Family Medicine

## 2022-03-25 ENCOUNTER — Ambulatory Visit (INDEPENDENT_AMBULATORY_CARE_PROVIDER_SITE_OTHER): Payer: Medicaid Other | Admitting: Family Medicine

## 2022-03-25 VITALS — BP 136/90 | HR 69 | Wt 246.0 lb

## 2022-03-25 DIAGNOSIS — I10 Essential (primary) hypertension: Secondary | ICD-10-CM

## 2022-03-25 DIAGNOSIS — E039 Hypothyroidism, unspecified: Secondary | ICD-10-CM | POA: Diagnosis not present

## 2022-03-25 MED ORDER — LOSARTAN POTASSIUM-HCTZ 100-25 MG PO TABS: 1.0000 | ORAL_TABLET | Freq: Every day | ORAL | 0 refills | Status: DC

## 2022-03-25 NOTE — Assessment & Plan Note (Addendum)
-  pending TSH -continue synthroid 150 mcg daily, may need increase but will await TSH -discussed that biotin may provide her with benefit to strengthen her hair and nails -work note provided  -follow up in 2-3 months for another TSH recheck

## 2022-03-25 NOTE — Patient Instructions (Signed)
It was great seeing you today!  Today we discussed your thyroid and nails. I think your nails will improve with biotin that you may take daily. I have provided you with your work note. We will check your thyroid today, I will let you know of any abnormal result or if we have to increase your synthroid. For now, please continue to take synthroid at the same dose.   I have also refilled your blood pressure medication, please take this daily.   Please follow up at your next scheduled appointment in 2-3 months, if anything arises between now and then, please don't hesitate to contact our office.   Thank you for allowing [place] to be a part of your medical care!  Thank you, Dr. Robyne Peers  Also a reminder of our clinic's no-show policy. Please make sure to arrive at least 15 minutes prior to your scheduled appointment time. Please try to cancel before 24 hours if you are not able to make it. If you no-show for 2 appointments then you will be receiving a warning letter. If you no-show after 3 visits, then you may be at risk of being dismissed from our clinic. This is to ensure that everyone is able to be seen in a timely manner. Thank you, we appreciate your assistance with this!

## 2022-03-25 NOTE — Progress Notes (Signed)
    SUBJECTIVE:   CHIEF COMPLAINT / HPI:   Compliant on synthroid daily. Reports feeling fatigue and tired, when she goes to work it takes a lot out of her to get through the day. Feels like she does not have the amount of energy that she did before. She started taking a multivitamin and started to eat breakfast in the morning but nothing is helping. She has been getting hair loss and trying to grow it back over the past 3 months.   History of hypertension, has not taken her medication today as she ran out this morning. Complaint on losartan-HCTZ daily. Denies chest pain, dyspnea or other symptoms.   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 She has had an infection in her nail and discussed with the supervisor who agrees and recommended to get a note from her doctor to have permission to wear acrylic.   OBJECTIVE:   BP (!) 136/90   Pulse 69   Wt 246 lb (111.6 kg)   LMP 03/20/2022   SpO2 98%   BMI 38.53 kg/m   General: Patient well-appearing, in no acute distress. HEENT: non-tender thyroid CV: RRR, no murmurs or gallops auscultated Resp: CTAB, no wheezing, rales or rhonchi noted   ASSESSMENT/PLAN:   Hypothyroidism -pending TSH -continue synthroid 150 mcg daily, may need increase but will await TSH -discussed that biotin may provide her with benefit to strengthen her hair and nails -work note provided  -follow up in 2-3 months for another TSH recheck   Hypertension -BP slightly elevated at 136/90, likely secondary to missed medication day today -refills provided of antihypertensive regimen -continue losartan-HCTZ daily -up to date on BMP      Mikel Pyon Robyne Peers, DO [facility]

## 2022-03-25 NOTE — Assessment & Plan Note (Signed)
-  BP slightly elevated at 136/90, likely secondary to missed medication day today -refills provided of antihypertensive regimen -continue losartan-HCTZ daily -up to date on BMP

## 2022-03-26 ENCOUNTER — Other Ambulatory Visit: Payer: Self-pay

## 2022-03-26 DIAGNOSIS — I10 Essential (primary) hypertension: Secondary | ICD-10-CM

## 2022-03-26 LAB — TSH: TSH: 8.64 u[IU]/mL — ABNORMAL HIGH (ref 0.450–4.500)

## 2022-03-26 MED ORDER — LOSARTAN POTASSIUM-HCTZ 100-25 MG PO TABS: 1.0000 | ORAL_TABLET | Freq: Every day | ORAL | 0 refills | Status: DC

## 2022-04-13 ENCOUNTER — Encounter (INDEPENDENT_AMBULATORY_CARE_PROVIDER_SITE_OTHER): Payer: Self-pay

## 2022-05-04 ENCOUNTER — Other Ambulatory Visit: Payer: Self-pay

## 2022-05-04 DIAGNOSIS — J302 Other seasonal allergic rhinitis: Secondary | ICD-10-CM

## 2022-05-04 MED ORDER — CETIRIZINE HCL 10 MG PO TABS
ORAL_TABLET | ORAL | 11 refills | Status: DC
Start: 2022-05-04 — End: 2022-09-02

## 2022-05-04 NOTE — Telephone Encounter (Signed)
FYI

## 2022-05-14 ENCOUNTER — Ambulatory Visit
Admission: EM | Admit: 2022-05-14 | Discharge: 2022-05-14 | Disposition: A | Discharge status: Home / Self Care | Payer: Medicaid Other

## 2022-05-14 DIAGNOSIS — M79675 Pain in left toe(s): Secondary | ICD-10-CM | POA: Diagnosis not present

## 2022-05-14 NOTE — ED Provider Notes (Signed)
[facility]    CSN: [id number] Arrival date & time: 05/14/22  0805      History   Chief Complaint Chief Complaint  Patient presents with   Foot Injury    HPI [name] is a 37 y.o. female.   Patient presents with pain and swelling to the distal end of the left fourth toe started about 5 days ago.  Patient thinks that symptoms are attributing to her wearing crocs on a daily basis but is not sure.  She denies any obvious injury to the area.  Denies any purulent drainage or associated fever.  Denies numbness or tingling.  She has not taken any medication for symptoms.  Patient has elevated blood pressure reading but has not taken her blood pressure medication today.  Patient does not report chest pain, shortness of breath, headache, dizziness, blurred vision, nausea, vomiting.   Foot Injury   Past Medical History:  Diagnosis Date   Anemia    all 3 pregnancies   Asthma    albuterol inhaler, last used one year ago   Bipolar disorder (HCC)    Cerumen impaction 08/20/2019   Gestational hypertension 2010   with 3rd pregnancy   Grave's disease    Hyperemesis    3rd pregnancy   No-show for appointment 08/16/2019   Postpartum thyroiditis 03/02/2011   Preterm labor    with 3rd pregnancy   Thyroid disease    had radioactive iodine   Well woman exam with routine gynecological exam 08/10/2019    Patient Active Problem List   Diagnosis Date Noted   Left-sided headache 04/22/2020   Thumb pain 01/28/2020   Acid reflux 01/28/2020   Depression 08/18/2014   ADHD, adult residual type 08/18/2014   Seasonal allergies 07/08/2014   Hypothyroidism 07/03/2014   Mood disorder in conditions classified elsewhere 07/13/2012   Hypertension 07/06/2012   Asthma 12/16/2010    Past Surgical History:  Procedure Laterality Date   DILATION AND CURETTAGE OF UTERUS  2010   LAPAROSCOPIC TUBAL LIGATION  08/10/2011   Procedure: LAPAROSCOPIC TUBAL LIGATION;  Surgeon: Tereso Newcomer, MD;  Location: [facility];  Service: Gynecology;  Laterality: Bilateral;    OB History     Gravida  5   Para  4   Term  3   Preterm  1   AB  1   Living  4      SAB  1   IAB      Ectopic      Multiple      Live Births  4            Home Medications    Prior to Admission medications   Medication Sig Start Date End Date Taking? Authorizing Provider  Blood Pressure Monitor DEVI 1 Device by Does not apply route daily. 01/22/20   Dollene Cleveland, DO  cetirizine (ZYRTEC) 10 MG tablet TAKE 1 TABLET(10 MG) BY MOUTH DAILY 05/04/22   Ganta, Anupa, DO  fluticasone (FLONASE) 50 MCG/ACT nasal spray Place 2 sprays into both nostrils daily. Patient not taking: Reported on 11/27/2021 06/22/21   Reece Leader, DO  levothyroxine (SYNTHROID) 150 MCG tablet Take 1 tablet (150 mcg total) by mouth every morning. 30 minutes before food 11/30/21   Ganta, Anupa, DO  losartan-hydrochlorothiazide (HYZAAR) 100-25 MG tablet Take 1 tablet by mouth daily. 03/26/22   Ganta, Anupa, DO  naproxen sodium (ANAPROX DS) 550 MG tablet Take 1 tablet (550 mg total) by mouth 2 (two)  times daily with a meal. 03/01/22   Crain, Whitney L, PA  tiZANidine (ZANAFLEX) 4 MG tablet Take 1 tablet (4 mg total) by mouth every 6 (six) hours as needed for muscle spasms. 03/01/22   Chaney Malling, PA    Family History Family History  Problem Relation Age of Onset   Hypertension Father    Hypertension Mother    Anemia Mother    Bipolar disorder Mother    Thyroid disease Maternal Aunt    Bipolar disorder Maternal Aunt    Diabetes Maternal Uncle    Diabetes Maternal Grandmother    Diabetes Paternal Grandmother    Cancer Paternal Grandmother    Anesthesia problems Neg Hx    Hypotension Neg Hx    Malignant hyperthermia Neg Hx    Pseudochol deficiency Neg Hx     Social History Social History   Tobacco Use   Smoking status: Former    Packs/day: 0.25    Years: 0.50    Total pack years: 0.13    Types:  Cigarettes    Quit date: 11/18/2011    Years since quitting: 10.4    Passive exposure: Past   Smokeless tobacco: Never  Substance Use Topics   Alcohol use: No   Drug use: Yes    Types: Marijuana     Allergies   Shellfish allergy   Review of Systems Review of Systems Per HPI  Physical Exam Triage Vital Signs ED Triage Vitals  Enc Vitals Group     BP 05/14/22 0812 (!) 167/129     Pulse Rate 05/14/22 0812 74     Resp 05/14/22 0812 16     Temp 05/14/22 0812 97.9 F (36.6 C)     Temp Source 05/14/22 0812 Oral     SpO2 05/14/22 0812 98 %     Weight --      Height --      Head Circumference --      Peak Flow --      Pain Score 05/14/22 0811 7     Pain Loc --      Pain Edu? --      Excl. in [place]? --    No data found.  Updated Vital Signs BP (!) 167/129 (BP Location: Left Arm)   Pulse 74   Temp 97.9 F (36.6 C) (Oral)   Resp 16   LMP 04/02/2022   SpO2 98%   Visual Acuity Right Eye Distance:   Left Eye Distance:   Bilateral Distance:    Right Eye Near:   Left Eye Near:    Bilateral Near:     Physical Exam Constitutional:      General: She is not in acute distress.    Appearance: Normal appearance. She is not toxic-appearing or diaphoretic.  HENT:     Head: Normocephalic and atraumatic.  Eyes:     Extraocular Movements: Extraocular movements intact.     Conjunctiva/sclera: Conjunctivae normal.     Pupils: Pupils are equal, round, and reactive to light.  Cardiovascular:     Rate and Rhythm: Normal rate and regular rhythm.     Pulses: Normal pulses.     Heart sounds: Normal heart sounds.  Pulmonary:     Effort: Pulmonary effort is normal. No respiratory distress.     Breath sounds: Normal breath sounds.  Musculoskeletal:       Feet:  Feet:     Comments: Very mild erythema and swelling present to distal end of left fourth toe  directly below nail.  There is no purulent drainage or area of fluctuance.  Patient can wiggle toes.  Capillary refill and pulses  are intact. Neurological:     General: No focal deficit present.     Mental Status: She is alert and oriented to person, place, and time. Mental status is at baseline.     Cranial Nerves: Cranial nerves 2-12 are intact.     Sensory: Sensation is intact.     Motor: Motor function is intact.     Coordination: Coordination is intact.     Gait: Gait is intact.  Psychiatric:        Mood and Affect: Mood normal.        Behavior: Behavior normal.        Thought Content: Thought content normal.        Judgment: Judgment normal.      UC Treatments / Results  Labs (all labs ordered are listed, but only abnormal results are displayed) Labs Reviewed - No data to display  EKG   Radiology No results found.  Procedures Procedures (including critical care time)  Medications Ordered in UC Medications - No data to display  Initial Impression / Assessment and Plan / UC Course  I have reviewed the triage vital signs and the nursing notes.  Pertinent labs & imaging results that were available during my care of the patient were reviewed by me and considered in my medical decision making (see chart for details).     Physical exam is consistent with possible injury from friction from shoes.  It is blisterlike.  Advised to avoid wearing crocs. There is no concern for bacterial infection at this time.  Advised patient to use warm Epsom salt soaks and over-the-counter pain relievers as needed.  Given no injury, imaging was deferred.  Patient was advised to follow-up if any symptoms persist or worsen and she was advised to monitor closely.  Patient also has elevated blood pressure and but reports that she has not taken her blood pressure medication today.  She states that she is going to the pharmacy to pick up her medication.  Given that she is asymptomatic regarding blood pressure, do not think that emergent evaluation is necessary.  Patient encouraged to take her blood pressure medication as soon  as possible and to monitor blood pressure at home.  Discussed return precautions.  Patient verbalized understanding and was agreeable with plan. Final Clinical Impressions(s) / UC Diagnoses   Final diagnoses:  Pain of toe of left foot     Discharge Instructions      I recommend that you use warm Epsom salt soaks for about 20 minutes at a time 2-3 times a day.  Follow-up if any swelling or pain worsens.    ED Prescriptions   None    PDMP not reviewed this encounter.   Teodora Medici, [place] 05/14/22 [phone]

## 2022-05-14 NOTE — Discharge Instructions (Signed)
I recommend that you use warm Epsom salt soaks for about 20 minutes at a time 2-3 times a day.  Follow-up if any swelling or pain worsens.

## 2022-05-14 NOTE — ED Triage Notes (Signed)
Patient presents to UC for left small pinky toe pain since 5 days. States bruising and swelling. Not taking anything for pain. States believes it may be related to the shoe she wears for work.  Denies injury.

## 2022-09-02 ENCOUNTER — Other Ambulatory Visit: Payer: Self-pay | Admitting: Family Medicine

## 2022-09-02 DIAGNOSIS — J302 Other seasonal allergic rhinitis: Secondary | ICD-10-CM

## 2022-09-16 ENCOUNTER — Encounter (HOSPITAL_COMMUNITY): Payer: Self-pay | Admitting: Emergency Medicine

## 2022-09-16 ENCOUNTER — Ambulatory Visit (HOSPITAL_COMMUNITY)
Admission: EM | Admit: 2022-09-16 | Discharge: 2022-09-16 | Disposition: A | Discharge status: Home / Self Care | Payer: Medicaid Other | Attending: Sports Medicine | Admitting: Sports Medicine

## 2022-09-16 ENCOUNTER — Other Ambulatory Visit: Payer: Self-pay

## 2022-09-16 DIAGNOSIS — M79671 Pain in right foot: Secondary | ICD-10-CM

## 2022-09-16 DIAGNOSIS — M722 Plantar fascial fibromatosis: Secondary | ICD-10-CM | POA: Diagnosis not present

## 2022-09-16 MED ORDER — IBUPROFEN 800 MG PO TABS: 800.0000 mg | ORAL_TABLET | Freq: Three times a day (TID) | ORAL | 0 refills | Status: DC

## 2022-09-16 NOTE — Discharge Instructions (Addendum)
You are suffering from [place] fasciitis.  Is very important that you work on the stretches 1-2 times a day that I provided you.  Ice the area vigorously.  He can use a frozen water bottle and roll it under your foot after work.  He can also try sleeping in a [place] sock and using a heel cushion in your shoes.  I have also sent to your pharmacy 800 mg ibuprofen to help with your pain and inflammation.  Follow-up with your primary care provider if symptoms worsen or fail to improve over 4 weeks.

## 2022-09-16 NOTE — ED Provider Notes (Signed)
[facility]    CSN: [id number] Arrival date & time: 09/16/22  4098      History   Chief Complaint Chief Complaint  Patient presents with   Foot Pain    HPI [name] is a 37 y.o. female.   She is here today with chief complaint of right foot pain greater than left.  She reports her pain is in her heel and it has been bothering her for the past 2 weeks that radiates into her toes.  She stands and walks a lot at work on concrete floors.  She describes her pain as sharp and some pins-and-needles.  Her pain is worse in the mornings.  She has not tried any interventions that she is unsure what to take.  She denies any injury to her foot.   Foot Pain    Past Medical History:  Diagnosis Date   Anemia    all 3 pregnancies   Asthma    albuterol inhaler, last used one year ago   Bipolar disorder (HCC)    Cerumen impaction 08/20/2019   Gestational hypertension 2010   with 3rd pregnancy   Grave's disease    Hyperemesis    3rd pregnancy   No-show for appointment 08/16/2019   Postpartum thyroiditis 03/02/2011   Preterm labor    with 3rd pregnancy   Thyroid disease    had radioactive iodine   Well woman exam with routine gynecological exam 08/10/2019    Patient Active Problem List   Diagnosis Date Noted   Left-sided headache 04/22/2020   Thumb pain 01/28/2020   Acid reflux 01/28/2020   Depression 08/18/2014   ADHD, adult residual type 08/18/2014   Seasonal allergies 07/08/2014   Hypothyroidism 07/03/2014   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 Authorizing Provider  ibuprofen (ADVIL) 800 MG tablet Take 1 tablet (800 mg total) by mouth 3 (three) times daily. 09/16/22  Yes Gillermo Murdoch A, DO  Blood Pressure Monitor DEVI 1 Device by Does not apply route daily. 01/22/20   Dollene Cleveland, DO  cetirizine (ZYRTEC) 10 MG tablet Take 1 tablet by mouth daily. 09/02/22   Ganta, Anupa, DO  fluticasone (FLONASE) 50 MCG/ACT nasal spray Place 2 sprays into both nostrils daily. Patient not taking: Reported on 11/27/2021 06/22/21   Reece Leader, DO  levothyroxine (SYNTHROID) 150 MCG tablet Take 1 tablet (150 mcg total) by mouth every morning. 30 minutes before food 11/30/21   Ganta, Anupa, DO  losartan-hydrochlorothiazide (HYZAAR) 100-25 MG tablet Take 1 tablet by mouth daily. 03/26/22   Ganta, Anupa, DO  tiZANidine (ZANAFLEX) 4  MG tablet Take 1 tablet (4 mg total) by mouth every 6 (six) hours as needed for muscle spasms. Patient not taking: Reported on 09/16/2022 03/01/22   Maretta Bees, PA    Family History Family History  Problem Relation Age of Onset   Hypertension Mother    Anemia Mother    Bipolar disorder Mother    Hypertension Father    Diabetes Maternal Grandmother    Diabetes Paternal Grandmother    Cancer Paternal Grandmother    Thyroid disease Maternal Aunt    Bipolar disorder Maternal Aunt    Diabetes Maternal Uncle    Anesthesia problems Neg Hx    Hypotension Neg Hx    Malignant hyperthermia Neg Hx    Pseudochol deficiency Neg Hx     Social History Social History   Tobacco Use   Smoking status: Former    Packs/day: 0.25    Years: 0.50    Additional pack years: 0.00    Total pack years: 0.13    Types: Cigarettes    Quit date: 11/18/2011    Years since quitting: 10.8    Passive exposure: Past    Smokeless tobacco: Never  Vaping Use   Vaping Use: Never used  Substance Use Topics   Alcohol use: No   Drug use: Not Currently    Types: Marijuana     Allergies   Shellfish allergy   Review of Systems Review of Systems As listed above in HPI  Physical Exam Triage Vital Signs ED Triage Vitals  Enc Vitals Group     BP 09/16/22 0946 (!) 156/110     Pulse Rate 09/16/22 0946 66     Resp 09/16/22 0946 20     Temp 09/16/22 0946 98 F (36.7 C)     Temp Source 09/16/22 0946 Oral     SpO2 09/16/22 0946 96 %     Weight --      Height --      Head Circumference --      Peak Flow --      Pain Score 09/16/22 0943 8     Pain Loc --      Pain Edu? --      Excl. in GC? --    No data found.  Updated Vital Signs BP (!) 144/99 (BP Location: Right Arm) Comment (BP Location): large cuff  Pulse 66   Temp 98 F (36.7 C) (Oral)   Resp 20   LMP 09/03/2022   SpO2 96%   Physical Exam Vitals reviewed.  Constitutional:      General: She is not in acute distress.    Appearance: Normal appearance. She is obese. She is not ill-appearing, toxic-appearing or diaphoretic.  Cardiovascular:     Rate and Rhythm: Normal rate.  Pulmonary:     Effort: Pulmonary effort is normal.  Neurological:     Mental Status: She is alert.   Right foot: No obvious deformity or asymmetry.  She does have some pes planus.  Tenderness to palpation at the origin of the plantar fascia.  Some discomfort with great toe extension.  Negative calcaneal squeeze.  Full range of motion plantarflexion, dorsiflexion inversion and eversion.  UC Treatments / Results  Labs (all labs ordered are listed, but only abnormal results are displayed) Labs Reviewed - No data to display  EKG   Radiology No results found.  Procedures Procedures (including critical care time)  Medications Ordered in UC Medications - No data to display  Initial Impression /  Assessment and Plan / UC Course  I have reviewed the triage vital  signs and the nursing notes.  Pertinent labs & imaging results that were available during my care of the patient were reviewed by me and considered in my medical decision making (see chart for details).     Bilateral plantar fascia regular than left I provided the patient with some stretches and exercises to perform 1-2 times a day until her pain has resolved.  I also sent to her pharmacy ibuprofen 800 mg that she can take 2-3 times a day for the next 7 days to help with her pain.  I recommend she ice regularly and try some heel cup inserts into her shoes and she can also sleep in a [place] sock.  Recommend she follow-up with her primary care provider symptoms worsen or fail to improve over the next 4 weeks.  Work note provided. Final Clinical Impressions(s) / UC Diagnoses   Final diagnoses:  Plantar fasciitis of right foot  Foot pain, right     Discharge Instructions      You are suffering from Plan[place] fasciitis.  Is very important that you work on the stretches 1-2 times a day that I provided you.  Ice the area vigorously.  He can use a frozen water bottle and roll it under your foot after work.  He can also try sleeping in a [place] sock and using a heel cushion in your shoes.  I have also sent to your pharmacy 800 mg ibuprofen to help with your pain and inflammation.  Follow-up with your primary care provider if symptoms worsen or fail to improve over 4 weeks.      ED Prescriptions     Medication Sig Dispense Auth. Provider   ibuprofen (ADVIL) 800 MG tablet Take 1 tablet (800 mg total) by mouth 3 (three) times daily. 30 tablet Gillermo Murdoch A, DO      PDMP not reviewed this encounter.   Gillermo Murdoch A, DO 09/16/22 1008

## 2022-09-16 NOTE — ED Notes (Signed)
Have reviewed work note as well

## 2022-09-16 NOTE — ED Triage Notes (Signed)
Right foot pain for 2 weeks.  Reports standing and walking at work.  Heel is aching and pins and needles.  Numbness with certain seated positions.  Pain into right lower leg.  Pain is worse in morning.  Elevation relieves symptoms.  Dependant position with or without weightbearing has a pressure sensation in foot.  No history of this.    Denies having taken any medications for symptoms

## 2022-10-20 ENCOUNTER — Ambulatory Visit: Payer: Medicaid Other | Admitting: Podiatry

## 2023-01-07 ENCOUNTER — Ambulatory Visit: Payer: Medicaid Other | Admitting: Pharmacist

## 2023-03-10 ENCOUNTER — Telehealth: Payer: Self-pay

## 2023-03-10 DIAGNOSIS — U071 COVID-19: Secondary | ICD-10-CM | POA: Diagnosis not present

## 2023-03-10 DIAGNOSIS — I10 Essential (primary) hypertension: Secondary | ICD-10-CM | POA: Diagnosis not present

## 2023-03-10 NOTE — Telephone Encounter (Signed)
Patient calls nurse line for 2 concerns.   (1) She reports she tested positive for Covid today at a [employer]. She reports she started feeling bad yesterday with headaches and cough. She denies any fevers, SOB or GI symptoms.   She reports they gave her a note to return to work on 11/25. However, she reports her employer Risk manager) is stating she should not return until 11/28. However, they are sill asking for a note to excuse her until 11/28.  (2) She reports she would like to FU apt in regards to her BP. She reports she takes medication daily, however she had 3 high readings at [employer]. She denies any headaches or vision changes.   Patient scheduled for 11/25 to discuss excuse extension and BP.  Precautions discussed.

## 2023-03-14 ENCOUNTER — Ambulatory Visit: Payer: Medicaid Other | Admitting: Family Medicine

## 2023-03-14 ENCOUNTER — Telehealth: Payer: Self-pay | Admitting: Family Medicine

## 2023-03-14 NOTE — Telephone Encounter (Signed)
patient dropped off form at front desk for medical leave.  Verified that patient section of form has been completed.  Last DOS/WCC with PCP was 03/25/22.  Placed form in blue team folder to be completed by clinical staff.  Vilinda Blanks

## 2023-03-14 NOTE — Telephone Encounter (Signed)
Handed forms to [email or web address] while in clinic today.

## 2023-03-14 NOTE — Progress Notes (Deleted)
    SUBJECTIVE:   CHIEF COMPLAINT / HPI:   BP check for elevated readings x3 and work note for COVID  PERTINENT  PMH / PSH: ***  OBJECTIVE:   There were no vitals taken for this visit.  ***  ASSESSMENT/PLAN:   No problem-specific Assessment & Plan notes found for this encounter.     Gerrit Heck, DO [facility]

## 2023-03-15 ENCOUNTER — Telehealth: Payer: Self-pay | Admitting: Family Medicine

## 2023-03-15 NOTE — Telephone Encounter (Signed)
Form placed up front for pick up.   Copy made for batch scanning.   Patient aware.

## 2023-03-15 NOTE — Telephone Encounter (Signed)
Dr. Manson Passey and I contacted the patient regarding a report from [facility] about an inappropriate comment directed at our front office staff. The patient was cooperative during the conversation and denied making any inappropriate remarks. I reviewed our clinic's policy on maintaining mutual respect between patients and staff. The patient expressed understanding and appreciation for the call.

## 2023-03-15 NOTE — Telephone Encounter (Signed)
Research scientist (physical sciences) Call for Inappropriate Patient Behavior   Called patient to discuss curse word used during encounter with front desk staff.   Present on call were patient, Dr. Lum Babe, and myself. Confirmed patient's name and DOB. Asked first for patient's clarification on events described. Engaged in active listening. She has COVID and did not wish get other ill. As such daughter brought in form. Patient displeased as she had to enter building to complete form per policy. Patient able to complete her portion of form. She is happy that the form is being completed. Reports all questions answered. Discussed policy around forms with patient.  Reviewed patient dismissal policy and inappropriate patient behavior policy.   At end of call, all questions answered.  Terisa Starr, MD  Family Medicine Teaching Service

## 2023-03-22 ENCOUNTER — Ambulatory Visit: Payer: Medicaid Other | Admitting: Family Medicine

## 2023-03-22 ENCOUNTER — Ambulatory Visit: Payer: Medicaid Other | Admitting: Family

## 2023-03-29 ENCOUNTER — Encounter (HOSPITAL_COMMUNITY): Payer: Self-pay

## 2023-03-29 ENCOUNTER — Ambulatory Visit (HOSPITAL_COMMUNITY)
Admission: EM | Admit: 2023-03-29 | Discharge: 2023-03-29 | Disposition: A | Discharge status: Home / Self Care | Payer: Medicaid Other

## 2023-03-29 DIAGNOSIS — R52 Pain, unspecified: Secondary | ICD-10-CM

## 2023-03-29 DIAGNOSIS — M79644 Pain in right finger(s): Secondary | ICD-10-CM

## 2023-03-29 DIAGNOSIS — I10 Essential (primary) hypertension: Secondary | ICD-10-CM

## 2023-03-29 MED ORDER — KETOROLAC TROMETHAMINE 30 MG/ML IJ SOLN
INTRAMUSCULAR | Status: AC
  Filled 2023-03-29: qty 1

## 2023-03-29 MED ORDER — LOSARTAN POTASSIUM-HCTZ 100-25 MG PO TABS: 1.0000 | ORAL_TABLET | Freq: Every day | ORAL | 0 refills | Status: DC

## 2023-03-29 MED ORDER — KETOROLAC TROMETHAMINE 30 MG/ML IJ SOLN
30.0000 mg | Freq: Once | INTRAMUSCULAR | Status: AC
  Administered 2023-03-29: 30 mg via INTRAMUSCULAR

## 2023-03-29 MED ORDER — CYCLOBENZAPRINE HCL 10 MG PO TABS: 10.0000 mg | ORAL_TABLET | Freq: Two times a day (BID) | ORAL | 0 refills | Status: DC | PRN

## 2023-03-29 MED ORDER — IBUPROFEN 800 MG PO TABS: 800.0000 mg | ORAL_TABLET | Freq: Four times a day (QID) | ORAL | 0 refills | Status: DC | PRN

## 2023-03-29 NOTE — Discharge Instructions (Addendum)
The Toradol injection given today should start to work in about 30 minutes. Please do not use any NSAIDs (ibuprofen/Advil, naproxen/Aleve, etc) for the next 12 hours. You can safely use tylenol.  Ibuprofen can be used tomorrow if needed  You can take the muscle relaxer (Flexeril) twice daily. If the medication makes you drowsy, take only at bed time   You can scan the QR code on the last page to get established with a primary care provider   Orthopedics information is below as well - I recommend follow up with them regarding your thumb pain

## 2023-03-29 NOTE — ED Triage Notes (Addendum)
Pt presents with complaints of generalized pain and body aches that began over the weekend. Pt states that she works for [employer] and only gets one day off a week right now which leaves no time for resting. Pt states she cannot bend her right thumb as well. Pt currently is in 10/10 pain. Pt denies taking medications for her pain. Pt denies recent injuries. Pt reports she tested COVID+ a few weeks ago.

## 2023-03-29 NOTE — ED Provider Notes (Signed)
[facility]    CSN: [id number] Arrival date & time: 03/29/23  0802      History   Chief Complaint Chief Complaint  Patient presents with   Hand Pain   Generalized Body Aches    HPI [name] is a 37 y.o. female.  Here with 3-4 day history of generalized body aches. Reporting 10/10 pain. Muscular pain, does a lot of heavy lifting at work. Denies fever or chills. Also having right thumb pain. Had this problem in the past. She is right hand dominant. Works for [employer] No known injury/trauma but lots of repetitive motions and overuse. Has not taken any medications for symptoms  Past Medical History:  Diagnosis Date   Anemia    all 3 pregnancies   Asthma    albuterol inhaler, last used one year ago   Bipolar disorder (HCC)    Cerumen impaction 08/20/2019   Gestational hypertension 2010   with 3rd pregnancy   Grave's disease    Hyperemesis    3rd pregnancy   No-show for appointment 08/16/2019   Postpartum thyroiditis 03/02/2011   Preterm labor    with 3rd pregnancy   Thyroid disease    had radioactive iodine   Well woman exam with routine gynecological exam 08/10/2019    Patient Active Problem List   Diagnosis Date Noted   Left-sided headache 04/22/2020   Thumb pain 01/28/2020   Acid reflux 01/28/2020   Depression 08/18/2014   ADHD, adult residual type 08/18/2014   Seasonal allergies 07/08/2014   Hypothyroidism 07/03/2014   Mood disorder in conditions classified elsewhere 07/13/2012   Hypertension 07/06/2012   Asthma 12/16/2010    Past Surgical History:  Procedure Laterality Date   DILATION AND CURETTAGE OF UTERUS  2010   LAPAROSCOPIC TUBAL LIGATION  08/10/2011   Procedure: LAPAROSCOPIC TUBAL LIGATION;  Surgeon: Tereso Newcomer, MD;  Location: [facility];  Service: Gynecology;  Laterality: Bilateral;    OB History     Gravida  5   Para  4   Term  3   Preterm  1   AB  1   Living  4      SAB  1   IAB      Ectopic      Multiple       Live Births  4            Home Medications    Prior to Admission medications   Medication Sig Start Date End Date Taking? Authorizing Provider  albuterol (VENTOLIN HFA) 108 (90 Base) MCG/ACT inhaler Inhale into the lungs. 03/10/23 03/09/24 Yes [provider]  cyclobenzaprine (FLEXERIL) 10 MG tablet Take 1 tablet (10 mg total) by mouth 2 (two) times daily as needed for muscle spasms. 03/29/23  Yes Will Heinkel, Lurena Joiner, PA-C  ibuprofen (ADVIL) 800 MG tablet Take 1 tablet (800 mg total) by mouth every 6 (six) hours as needed. 03/30/23  Yes Ladana Chavero, Lurena Joiner, PA-C  Blood Pressure Monitor DEVI 1 Device by Does not apply route daily. 01/22/20   Dollene Cleveland, DO  cetirizine (ZYRTEC) 10 MG tablet Take 1 tablet by mouth daily. 09/02/22   Reece Leader, DO  levothyroxine (SYNTHROID) 150 MCG tablet Take 1 tablet (150 mcg total) by mouth every morning. 30 minutes before food 11/30/21   Ganta, Anupa, DO  losartan-hydrochlorothiazide (HYZAAR) 100-25 MG tablet Take 1 tablet by mouth daily. 03/29/23   Nida Manfredi, Ray Church    Family History Family History  Problem Relation Age of  Onset   Hypertension Mother    Anemia Mother    Bipolar disorder Mother    Hypertension Father    Diabetes Maternal Grandmother    Diabetes Paternal Grandmother    Cancer Paternal Grandmother    Thyroid disease Maternal Aunt    Bipolar disorder Maternal Aunt    Diabetes Maternal Uncle    Anesthesia problems Neg Hx    Hypotension Neg Hx    Malignant hyperthermia Neg Hx    Pseudochol deficiency Neg Hx     Social History Social History   Tobacco Use   Smoking status: Former    Current packs/day: 0.00    Average packs/day: 0.3 packs/day for 0.5 years (0.1 ttl pk-yrs)    Types: Cigarettes    Start date: 05/20/2011    Quit date: 11/18/2011    Years since quitting: 11.3    Passive exposure: Past   Smokeless tobacco: Never  Vaping Use   Vaping status: Never Used  Substance Use Topics   Alcohol use:  No   Drug use: Not Currently    Types: Marijuana     Allergies   Shellfish allergy   Review of Systems Review of Systems Per HPI  Physical Exam Triage Vital Signs ED Triage Vitals  Encounter Vitals Group     BP 03/29/23 0830 (!) 144/96     Systolic BP Percentile --      Diastolic BP Percentile --      Pulse Rate 03/29/23 0830 81     Resp 03/29/23 0830 18     Temp 03/29/23 0830 98.3 F (36.8 C)     Temp Source 03/29/23 0830 Oral     SpO2 03/29/23 0830 97 %     Weight 03/29/23 0828 240 lb (108.9 kg)     Height 03/29/23 0828 5\' 7"  (1.702 m)     Head Circumference --      Peak Flow --      Pain Score 03/29/23 0827 10     Pain Loc --      Pain Education --      Exclude from Growth Chart --    No data found.  Updated Vital Signs BP (!) 144/94 (BP Location: Left Arm)   Pulse 81   Temp 98.3 F (36.8 C) (Oral)   Resp 18   Ht 5\' 7"  (1.702 m)   Wt 240 lb (108.9 kg)   LMP 03/24/2023 (Exact Date)   SpO2 97%   BMI 37.59 kg/m    Physical Exam Vitals and nursing note reviewed.  Constitutional:      General: She is not in acute distress. HENT:     Mouth/Throat:     Mouth: Mucous membranes are moist.     Pharynx: Oropharynx is clear.  Eyes:     Conjunctiva/sclera: Conjunctivae normal.  Cardiovascular:     Rate and Rhythm: Normal rate and regular rhythm.     Pulses: Normal pulses.     Heart sounds: Normal heart sounds.  Pulmonary:     Effort: Pulmonary effort is normal.     Breath sounds: Normal breath sounds.  Musculoskeletal:     Cervical back: Normal range of motion.     Comments: Right thumb normal ROM despite pain. No obvious deformity or bony tenderness. Grip strength intact. Sensation normal, cap refill < 2 seconds. Radial pulse 2+  Skin:    General: Skin is warm and dry.  Neurological:     Mental Status: She is alert and oriented to person,  place, and time.      UC Treatments / Results  Labs (all labs ordered are listed, but only abnormal results  are displayed) Labs Reviewed - No data to display  EKG  Radiology No results found.  Procedures Procedures   Medications Ordered in UC Medications  ketorolac (TORADOL) 30 MG/ML injection 30 mg (30 mg Intramuscular Given 03/29/23 0915)    Initial Impression / Assessment and Plan / UC Course  I have reviewed the triage vital signs and the nursing notes.  Pertinent labs & imaging results that were available during my care of the patient were reviewed by me and considered in my medical decision making (see chart for details).  Generalized body aches Toradol IM given in clinic Flexeril BID prn Starting tomorrow can use ibuprofen if needed  Right thumb pain Wrist/thumb brace provided Follow with ortho  HTN Requesting refill of meds. Sent 30 day supply. BP 144/94 Advised scan QR code for PCP follow up  Work note provided Return precautions Patient is agreeable to plan, no questions at this time  Final Clinical Impressions(s) / UC Diagnoses   Final diagnoses:  Generalized body aches  Thumb pain, right     Discharge Instructions      The Toradol injection given today should start to work in about 30 minutes. Please do not use any NSAIDs (ibuprofen/Advil, naproxen/Aleve, etc) for the next 12 hours. You can safely use tylenol.  Ibuprofen can be used tomorrow if needed  You can take the muscle relaxer (Flexeril) twice daily. If the medication makes you drowsy, take only at bed time   You can scan the QR code on the last page to get established with a primary care provider   Orthopedics information is below as well - I recommend follow up with them regarding your thumb pain     ED Prescriptions     Medication Sig Dispense Auth. Provider   cyclobenzaprine (FLEXERIL) 10 MG tablet Take 1 tablet (10 mg total) by mouth 2 (two) times daily as needed for muscle spasms. 20 tablet Kirah Stice, PA-C   ibuprofen (ADVIL) 800 MG tablet Take 1 tablet (800 mg total) by mouth  every 6 (six) hours as needed. 21 tablet Stefanny Pieri, PA-C   losartan-hydrochlorothiazide (HYZAAR) 100-25 MG tablet Take 1 tablet by mouth daily. 30 tablet Vala Raffo, Lurena Joiner, PA-C      PDMP not reviewed this encounter.   Marlow Baars, [place] 03/29/23 3244

## 2023-03-31 DIAGNOSIS — M65311 Trigger thumb, right thumb: Secondary | ICD-10-CM | POA: Diagnosis not present

## 2023-04-28 DIAGNOSIS — M65311 Trigger thumb, right thumb: Secondary | ICD-10-CM | POA: Diagnosis not present

## 2023-05-12 DIAGNOSIS — M79644 Pain in right finger(s): Secondary | ICD-10-CM | POA: Diagnosis not present

## 2023-07-18 NOTE — Telephone Encounter (Signed)
 Marland Kitchen

## 2023-09-09 ENCOUNTER — Ambulatory Visit: Admitting: Internal Medicine

## 2023-09-20 ENCOUNTER — Ambulatory Visit: Admitting: Internal Medicine

## 2023-09-24 ENCOUNTER — Telehealth

## 2023-09-27 ENCOUNTER — Encounter: Payer: Self-pay | Admitting: Student

## 2023-09-27 ENCOUNTER — Ambulatory Visit (INDEPENDENT_AMBULATORY_CARE_PROVIDER_SITE_OTHER): Admitting: Student

## 2023-09-27 VITALS — BP 155/119 | HR 83 | Ht 67.0 in | Wt 230.2 lb

## 2023-09-27 DIAGNOSIS — Z139 Encounter for screening, unspecified: Secondary | ICD-10-CM | POA: Diagnosis not present

## 2023-09-27 DIAGNOSIS — E039 Hypothyroidism, unspecified: Secondary | ICD-10-CM | POA: Diagnosis not present

## 2023-09-27 DIAGNOSIS — Z789 Other specified health status: Secondary | ICD-10-CM | POA: Diagnosis present

## 2023-09-27 DIAGNOSIS — I10 Essential (primary) hypertension: Secondary | ICD-10-CM | POA: Diagnosis not present

## 2023-09-27 DIAGNOSIS — J302 Other seasonal allergic rhinitis: Secondary | ICD-10-CM | POA: Diagnosis not present

## 2023-09-27 MED ORDER — FEXOFENADINE HCL 180 MG PO TABS: 180.0000 mg | ORAL_TABLET | Freq: Every day | ORAL | 0 refills | Status: DC

## 2023-09-27 MED ORDER — LEVOTHYROXINE SODIUM 150 MCG PO TABS: 150.0000 ug | ORAL_TABLET | ORAL | 3 refills | Status: DC

## 2023-09-27 MED ORDER — LOSARTAN POTASSIUM-HCTZ 100-25 MG PO TABS
1.0000 | ORAL_TABLET | Freq: Every day | ORAL | 0 refills | Status: DC
Start: 2023-09-27 — End: 2023-12-20

## 2023-09-27 NOTE — Assessment & Plan Note (Signed)
 Hypertension with slightly elevated blood pressure due to lack of medication refills. Drowsiness and frequent urination reported with hydrochlorothiazide , affecting job performance. - Refilled antihypertensive medication and sent to preferred pharmacy. - Discussed writing an accommodation letter for work regarding medication side effects.

## 2023-09-27 NOTE — Patient Instructions (Addendum)
 Pleasure to meet you today.  Your blood pressure today was elevated and I have refilled your blood pressure medications.  Today I have also refilled your thyroid  medication, and also placed lab order tp check your thyroid  level  Go to [facility] at [address] [place].  Placed a referral to our social worker to help with resources for your housing and financial difficulties.

## 2023-09-27 NOTE — Assessment & Plan Note (Signed)
 Hypothyroidism with elevated thyroid  levels over a year ago. Off medication for three months due to homelessness and financial constraints. Reports feeling cold. - Refilled thyroid  medication and sent to preferred pharmacy. - Ordered thyroid  function test and provided lab order for blood draw at external lab.

## 2023-09-27 NOTE — Progress Notes (Signed)
    SUBJECTIVE:   CHIEF COMPLAINT / HPI:   [name] is a 38 year old female with hypertension and hypothyroidism who presents for blood pressure and thyroid  level check.  She has not taken her blood pressure medication for about two months due to running out and not refilling it. 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 She has not taken her thyroid  medication for about three months due to homelessness and financial constraints. She feels cold but denies constipation. Her weight remains stable between 230 to 240 pounds.  She has a sore throat and watery eyes with pus production for over seven days. A virtual nurse prescribed eye drops for conjunctivitis, but she did not use them due to uncertainty about the diagnosis. Allergies are common in her family.  She is currently homeless, staying in a hotel with her four children and two dogs. 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 Her parents help when they can, but she struggles with financial stability and maintaining car payments.  PERTINENT  PMH / PSH: Reviewed  OBJECTIVE:   BP (!) 155/119   Pulse 83   Ht 5\' 7"  (1.702 m)   Wt 230 lb 3.2 oz (104.4 kg)   LMP 09/26/2023   SpO2 98%   BMI 36.05 kg/m    Physical Exam General: Alert, well appearing, NAD Cardiovascular: RRR, No Murmurs, Normal S2/S2 Respiratory: CTAB, No wheezing or Rales Abdomen: No distension or tenderness Extremities: No edema on extremities   Skin: Warm and dry  ASSESSMENT/PLAN:   Hypertension Hypertension with slightly elevated blood pressure due to lack of medication refills. Drowsiness and frequent urination reported with hydrochlorothiazide , affecting job performance. - Refilled antihypertensive medication and sent to preferred pharmacy. - Discussed writing an accommodation letter for work regarding medication side effects.  Hypothyroidism Hypothyroidism with  elevated thyroid  levels over a year ago. Off medication for three months due to homelessness and financial constraints. Reports feeling cold. - Refilled thyroid  medication and sent to preferred pharmacy. - Ordered thyroid  function test and provided lab order for blood draw at external lab.  Allergic Conjunctivitis Symptoms suggest allergic conjunctivitis, with family history of allergies. Did not use prescribed pink eye drops. - Rx allegra for allergies   Social Determinants of Health Experiencing homelessness and financial instability affecting work performance. Seeking assistance with resources and support. - Referred to social worker for assistance with resources and support.  Goble Last, MD [facility]

## 2023-09-28 ENCOUNTER — Telehealth: Payer: Self-pay | Admitting: *Deleted

## 2023-09-28 LAB — TSH: TSH: 53.7 u[IU]/mL — ABNORMAL HIGH (ref 0.450–4.500)

## 2023-09-28 NOTE — Progress Notes (Signed)
 Complex Care Management Note Care Guide Note  09/28/2023 Name: [name] MRN: [id number] DOB: 1985-06-15   Complex Care Management Outreach Attempts: An unsuccessful telephone outreach was attempted today to offer the patient information about available complex care management services.  Follow Up Plan:  Additional outreach attempts will be made to offer the patient complex care management information and services.   Encounter Outcome:  No Answer  Barnie Bora  [facility]  [facility], [facility] Guide  Direct Dial: [phone]  Fax [phone]

## 2023-09-29 ENCOUNTER — Other Ambulatory Visit: Payer: Self-pay | Admitting: Student

## 2023-09-29 ENCOUNTER — Ambulatory Visit: Payer: Self-pay | Admitting: Student

## 2023-09-29 DIAGNOSIS — Z139 Encounter for screening, unspecified: Secondary | ICD-10-CM

## 2023-09-29 NOTE — Progress Notes (Signed)
 Complex Care Management Note Care Guide Note  09/29/2023 Name: PALOMA GRANGE MRN: [id number] DOB: Sep 07, 1985   Complex Care Management Outreach Attempts: A second unsuccessful outreach was attempted today to offer the patient with information about available complex care management services.  Follow Up Plan:  Additional outreach attempts will be made to offer the patient complex care management information and services.   Encounter Outcome:  No Answer  Barnie Bora  [facility]  [facility], [place] [facility] Guide  Direct Dial: [phone]  Fax [phone]

## 2023-09-29 NOTE — Progress Notes (Signed)
 Please see note from 09/27/23

## 2023-09-29 NOTE — Progress Notes (Signed)
 Complex Care Management Note  Care Guide Note 09/29/2023 Name: TIFFANNY LAMARCHE MRN: [id number] DOB: 03-Sep-1985  Obie Bells Marcoe is a 38 y.o. year old female who sees Everhart, Kirstie, DO for primary care. I reached out to [name] by phone today to offer complex care management services.  Ms. Rossini was given information about Complex Care Management services today including:   The Complex Care Management services include support from the care team which includes your Nurse Care Manager, Clinical Social Worker, or Pharmacist.  The Complex Care Management team is here to help remove barriers to the health concerns and goals most important to you. Complex Care Management services are voluntary, and the patient may decline or stop services at any time by request to their care team member.   Complex Care Management Consent Status: Patient agreed to services and verbal consent obtained.   Follow up plan:  Telephone appointment with complex care management team member scheduled for:  6/13 BSW and 6/16 with RNCM. Will route to pharmacy and community resources.   Encounter Outcome:  Patient Scheduled  Barnie Bora  [facility] Health  [facility], [place] [facility] Guide  Direct Dial: [phone]  Fax [phone]

## 2023-09-30 ENCOUNTER — Encounter: Payer: Self-pay | Admitting: Student

## 2023-09-30 ENCOUNTER — Other Ambulatory Visit: Payer: Self-pay | Admitting: Family Medicine

## 2023-09-30 ENCOUNTER — Telehealth: Payer: Self-pay

## 2023-09-30 ENCOUNTER — Other Ambulatory Visit: Payer: Self-pay

## 2023-09-30 DIAGNOSIS — E039 Hypothyroidism, unspecified: Secondary | ICD-10-CM

## 2023-09-30 NOTE — Telephone Encounter (Signed)
 Patient returns call to nurse line regarding request for letter for employer. She needs this as soon as possible as she is supposed to return to work tomorrow.   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   Forwarding to Dr. Mikeal Alder.   Elsie Halo, RN

## 2023-09-30 NOTE — Patient Outreach (Addendum)
 Complex Care Management   Visit Note  09/30/2023  Name:  [name] MRN: [id number] DOB: [birth year]  Situation: Referral received for Complex Care Management related to SDOH Barriers:  Housing   Food insecurity Financial Resource Strain I obtained verbal consent from Patient.  Visit completed with patient  on the phone  Background:   Past Medical History:  Diagnosis Date   Anemia    all 3 pregnancies   Asthma    albuterol  inhaler, last used one year ago   Bipolar disorder (HCC)    Cerumen impaction 08/20/2019   Gestational hypertension 2010   with 3rd pregnancy   Grave's disease    Hyperemesis    3rd pregnancy   No-show for appointment 08/16/2019   Postpartum thyroiditis 03/02/2011   Preterm labor    with 3rd pregnancy   Thyroid  disease    had radioactive iodine   Well woman exam with routine gynecological exam 08/10/2019    Assessment: BSW outreached patient to assist with SDOH barriers.BSW identified challenges with housing, and food insecurity. BSW sent patient resources to assist with housing and food insecurity.  SDOH Interventions Today    Flowsheet Row Most Recent Value  SDOH Interventions   Food Insecurity Interventions Community Resources Provided  Financial Strain Interventions Community Resources Provided      Recommendation:   No recommendations at this time.   Follow Up Plan:   Telephone follow-up on 10/28/23 at 9am Patient has met all care management goals. Care Management case will be closed. Patient has been provided contact information should new needs arise.   [name], BSW [place]  [facility] Social Worker, [employer] [facility] [phone]

## 2023-09-30 NOTE — Progress Notes (Signed)
 Work letter sent via [employer]

## 2023-09-30 NOTE — Patient Instructions (Signed)
 Visit Information  Thank you for taking time to visit with me today. Please don't hesitate to contact me if I can be of assistance to you before our next scheduled appointment.  Our next appointment is by telephone on 10/28/2023 at 9am Please call the care guide team at [phone] if you need to cancel or reschedule your appointment.      Please call the Suicide and Crisis Lifeline: 988 call the [place]  National Suicide Prevention Lifeline: 734-688-4253 or TTY: 614-804-1942 TTY (586)266-4411) to talk to a trained counselor call 1-800-273-TALK (toll free, 24 hour hotline) go to [facility] [address], [place] [phone]) call 911 if you are experiencing a Mental Health or Behavioral Health Crisis or need someone to talk to.  Patient verbalizes understanding of instructions and care plan provided today and agrees to view in MyChart. Active MyChart status and patient understanding of how to access instructions and care plan via MyChart confirmed with patient.     [name], BSW [place]  [facility] Social Worker, [employer] [facility] [phone]

## 2023-09-30 NOTE — Progress Notes (Signed)
   Telephone encounter was:  Unsuccessful.  09/30/2023 Name: [name] MRN: [id number] DOB: 04-Aug-1985  Unsuccessful outbound call made today to assist with:  Food Insecurity and Financial Difficulties related to financial strain  Outreach Attempt:  1st Attempt  A HIPAA compliant voice message was left requesting a return call.  Instructed patient to call back   Azell Leopard [facility] Guide, Phone: [phone] Fax: [phone] Website: [email or web address]

## 2023-10-03 ENCOUNTER — Other Ambulatory Visit: Payer: Self-pay

## 2023-10-03 ENCOUNTER — Telehealth: Payer: Self-pay | Admitting: Pharmacist

## 2023-10-03 ENCOUNTER — Other Ambulatory Visit: Payer: Self-pay | Admitting: Family Medicine

## 2023-10-03 NOTE — Patient Instructions (Signed)
 Visit Information  [name] was given information about Medicaid Managed Care team care coordination services as a part of their [facility] Community Plan Medicaid benefit. [name] verbally consented to engagement with the [facility] Managed Care team.   If you are experiencing a medical emergency, please call 911 or report to your local emergency department or urgent care.   If you have a non-emergency medical problem during routine business hours, please contact your provider's office and ask to speak with a nurse.   For questions related to your [facility], please call: [phone] or visit the homepage here: [email or web address]  If you would like to schedule transportation through your [facility], please call the following number at least 2 days in advance of your appointment: [phone]   Rides for urgent appointments can also be made after hours by calling Member Services.  Call the Behavioral Health Crisis Line at [phone], at any time, 24 hours a day, 7 days a week. If you are in danger or need immediate medical attention call 911.  If you would like help to quit smoking, call 1-800-QUIT-NOW ((515)528-0731) OR Espaol: 1-855-Djelo-Ya (4-403-474-2595) o para ms informacin haga clic aqu or Text READY to 638-756 to register via text  [name] - following are the goals we discussed in your visit today:   Goals Addressed             This Visit's Progress    VBCI RN Care Plan related to HTN   On track    Problems:  Chronic Disease Management support and education needs related to HTN  Goal: Over the next 30 days the Patient will attend all scheduled medical appointments: PCP as evidenced by completed visit notes uploaded to EMR        demonstrate Improved adherence to prescribed treatment plan for HTN as evidenced by improved blood pressure  readings upon assessment by provider, patient report of decreased symptoms of high blood pressure take all medications exactly as prescribed and will call provider for medication related questions as evidenced by patient report of medication adherence    work with CMRN/PCP to obtain BP cuff as evidenced by review of electronic medical record and patient or care team member report    Interventions:   Hypertension Interventions: Last practice recorded BP readings:  BP Readings from Last 3 Encounters:  09/27/23 (!) 155/119  03/29/23 (!) 144/94  09/16/22 (!) 144/99   Most recent eGFR/CrCl:  Lab Results  Component Value Date   EGFR 88 11/27/2021    No components found for: CRCL  Evaluation of current treatment plan related to hypertension self management and patient's adherence to plan as established by provider Provided education to patient re: stroke prevention, s/s of heart attack and stroke Reviewed medications with patient and discussed importance of compliance Provided assistance with obtaining home blood pressure monitor via message sent to PCP requesting order; Discussed plans with patient for ongoing care management follow up and provided patient with direct contact information for care management team Reviewed scheduled/upcoming provider appointments including:  Provided education on prescribed diet low sodium Discussed complications of poorly controlled blood pressure such as heart disease, stroke, circulatory complications, vision complications, kidney impairment, sexual dysfunction Screening for signs and symptoms of depression related to chronic disease state  Assessed social determinant of health barriers  Patient Self-Care Activities:  Attend all scheduled provider appointments Call provider office for new concerns or questions  Take medications as prescribed  Work with the Child psychotherapist to address care coordination needs and will continue to work with the clinical  team to address health care and disease management related needs take medications for blood pressure exactly as prescribed report new symptoms to your doctor eat more whole grains, fruits and vegetables, lean meats and healthy fats Obtain new BP cuff  Plan:  Telephone follow up appointment with care management team member scheduled for:  10/13/23 at 2 PM The patient has been provided with contact information for the care management team and has been advised to call with any health related questions or concerns.           VBCI RN Care Plan related to hypothyroidism   On track    Problems:  Chronic Disease Management support and education needs related to Hypothyroidism  Goal: Over the next 30 days the Patient will attend all scheduled medical appointments: PCP as evidenced by completed visit notes uploaded to EMR        demonstrate Improved adherence to prescribed treatment plan for Hypothyroidism as evidenced by patient report of decreased symptoms (heavy period, mood changes) take all medications exactly as prescribed and will call provider for medication related questions as evidenced by patient report of medication adherence     Interventions:   Evaluation of current treatment plan related to Hypothyroidism,  self-management and patient's adherence to plan as established by provider. Discussed plans with patient for ongoing care management follow up and provided patient with direct contact information for care management team Evaluation of current treatment plan related to hypothyroidism and patient's adherence to plan as established by provider Provided education to patient re: symptoms of hypothyroidism Reviewed medications with patient and discussed importance of taking on an empty stomach and waiting 30-60 minutes before eating  Reviewed scheduled/upcoming provider appointments including PCP 10/10/23  Patient Self-Care Activities:  Attend all scheduled provider  appointments Call provider office for new concerns or questions  Take medications as prescribed   Work with the social worker to address care coordination needs and will continue to work with the clinical team to address health care and disease management related needs  Plan:  Telephone follow up appointment with care management team member scheduled for:  10/13/23 at 2 PM The patient has been provided with contact information for the care management team and has been advised to call with any health related questions or concerns.              Patient verbalizes understanding of instructions and care plan provided today and agrees to view in MyChart. Active MyChart status and patient understanding of how to access instructions and care plan via MyChart confirmed with patient.     RN Care Manager will contact PCP to request an order for BP monitor Telephone follow up appointment with Managed Medicaid care management team member scheduled for: 10/13/23 at 2 PM  Theodora Fish, RN MSN [place]  VBCI Population Health RN Care Manager Direct Dial: [phone]  Fax: [phone]   Following is a copy of your plan of care:  There are no care plans that you recently modified to display for this patient.

## 2023-10-03 NOTE — Telephone Encounter (Signed)
 Attempted to contact patient for follow-up of request for assistance with medication costs.  Patient appears to have Medicaid. Possibly the [facility] could be used to set-up a charge account for medications if patient willing to transfer prescriptions.  Left HIPAA compliant voice mail requesting call back to direct phone: [phone]  Total time with patient call and documentation of interaction: 4 minutes.  Follow-up phone call planned: 2-3 days if patient does not return call.

## 2023-10-03 NOTE — Patient Outreach (Signed)
 Complex Care Management   Visit Note  10/03/2023  Name:  [name] MRN: [id number] DOB: 29-Jul-1985  Situation: Referral received for Complex Care Management related to HTN and Hypothyroidism I obtained verbal consent from Patient.  Visit completed with Desmond [place]  on the phone  Background:   Past Medical History:  Diagnosis Date   Anemia    all 3 pregnancies   Asthma    albuterol  inhaler, last used one year ago   Bipolar disorder (HCC)    Cerumen impaction 08/20/2019   Gestational hypertension 2010   with 3rd pregnancy   Grave's disease    Hyperemesis    3rd pregnancy   No-show for appointment 08/16/2019   Postpartum thyroiditis 03/02/2011   Preterm labor    with 3rd pregnancy   Thyroid  disease    had radioactive iodine   Well woman exam with routine gynecological exam 08/10/2019    Assessment: Patient Reported Symptoms:  Cognitive Cognitive Status: Able to follow simple commands, Alert and oriented to person, place, and time, Normal speech and language skills Cognitive/Intellectual Conditions Management [RPT]: None reported or documented in medical history or problem list      Neurological Neurological Review of Symptoms: No symptoms reported    HEENT HEENT Symptoms Reported: No symptoms reported      Cardiovascular Cardiovascular Symptoms Reported: Other:, Dizziness, Lightheadness (some dizziness and lightheadedness) Other Cardiovascular Symptoms: specks or little dots in her vision Does patient have uncontrolled Hypertension?: Yes Is patient checking Blood Pressure at home?: No (Does not have BP cuff) Cardiovascular Conditions: Hypertension Cardiovascular Management Strategies: Medication therapy, Diet modification Cardiovascular Comment: Ensured patient has obtained and is taking BP medication. Message sent to PCP asking for order for BP cuff due to recently elevated BP cuff in office.  Respiratory Respiratory Symptoms Reported: No symptoms  reported Respiratory Conditions: Asthma  Endocrine Patient reports the following symptoms related to hypoglycemia or hyperglycemia : No symptoms reported Is patient diabetic?: No Endocrine Conditions: Thyroid  disorder Endocrine Management Strategies: Medication therapy Endocrine Comment: Ensured patient has obtained and is taking thyroid  medication.  Gastrointestinal Gastrointestinal Symptoms Reported: No symptoms reported (Last BM today) Additional Gastrointestinal Details: Patient reports appetite is up and down. Gastrointestinal Management Strategies: Diet modification Nutrition Risk Screen (CP): No indicators present  Genitourinary Genitourinary Symptoms Reported: No symptoms reported    Integumentary Integumentary Symptoms Reported: Other Other Integumentary Symptoms: Patient reports skin irritation under her breasts from heat/moisture Skin Management Strategies: Coping strategies Skin Comment: Advised patient to thoroughly clean/dry irritated skin and apply product such as baby powder, antifungal cream, or diaper cream onto the area  Musculoskeletal Musculoskelatal Symptoms Reviewed: Unsteady gait (I've been a little wobbly) Musculoskeletal Management Strategies: Adequate rest Falls in the past year?: No Number of falls in past year: 1 or less Was there an injury with Fall?: No Fall Risk Category Calculator: 0 Patient Fall Risk Level: Low Fall Risk Patient at Risk for Falls Due to: No Fall Risks Fall risk Follow up: Falls evaluation completed  Psychosocial Psychosocial Symptoms Reported: Anxiety - if selected complete GAD Behavioral Health Conditions: Depression, ADHD/ADD Behavioral Management Strategies: Support system, Coping strategies (Emotional support animal) Major Change/Loss/Stressor/Fears (CP): Relationship concerns, Resources, Death of a loved one Behaviors When Feeling Stressed/Fearful: Remove herself from Techniques to Cope with Loss/Stress/Change: Diversional  activities Quality of Family Relationships: supportive, helpful Do you feel physically threatened by others?: No      10/03/2023    4:24 PM  Depression screen PHQ 2/9  Decreased Interest  0  Down, Depressed, Hopeless 0  PHQ - 2 Score 0    There were no vitals filed for this visit.  Medications Reviewed Today     Reviewed by Valaria Garland, RN (Registered Nurse) on 10/03/23 at 1601  Med List Status: <None>   Medication Order Taking? Sig Documenting Provider Last Dose Status Informant  albuterol  (VENTOLIN  HFA) 108 (90 Base) MCG/ACT inhaler [id number]  Inhale into the lungs. [provider]  Active   Blood Pressure Monitor DEVI [id number]  1 Device by Does not apply route daily. America Just, DO  Active   cyclobenzaprine  (FLEXERIL ) 10 MG tablet [id number]  Take 1 tablet (10 mg total) by mouth 2 (two) times daily as needed for muscle spasms. Rising, Ivette Marks, PA-C  Active   fexofenadine  (ALLEGRA  ALLERGY) 180 MG tablet [id number]  Take 1 tablet (180 mg total) by mouth daily for 14 days. Goble Last, MD  Active   ibuprofen  (ADVIL ) 800 MG tablet [id number]  Take 1 tablet (800 mg total) by mouth every 6 (six) hours as needed. Rising, Beth Brooke  Active   levothyroxine  (SYNTHROID ) 150 MCG tablet [id number]  Take 1 tablet (150 mcg total) by mouth every morning. 30 minutes before food Goble Last, MD  Active   losartan -hydrochlorothiazide  (HYZAAR) 100-25 MG tablet [id number] Yes Take 1 tablet by mouth daily. Goble Last, MD  Active             Recommendation:   PCP Follow-up DME requests:  other BP monitor Continue Current Plan of Care  Follow Up Plan:   Telephone follow up appointment date/time:  10/13/23 at 2 PM  Theodora Fish, RN MSN [place]  [facility] Health RN Care Manager Direct Dial: [phone]  Fax: [phone]

## 2023-10-04 ENCOUNTER — Other Ambulatory Visit: Payer: Self-pay | Admitting: Family Medicine

## 2023-10-04 DIAGNOSIS — I10 Essential (primary) hypertension: Secondary | ICD-10-CM | POA: Insufficient documentation

## 2023-10-04 MED ORDER — BLOOD PRESSURE MONITOR DEVI
1.0000 | Freq: Every day | 0 refills | Status: AC
Start: 2023-10-04 — End: ?

## 2023-10-06 ENCOUNTER — Telehealth: Payer: Self-pay

## 2023-10-06 ENCOUNTER — Other Ambulatory Visit: Payer: Self-pay

## 2023-10-06 NOTE — Patient Instructions (Signed)
 Visit Information  [name] was given information about Medicaid Managed Care team care coordination services as a part of their [facility] Community Plan Medicaid benefit. [name] verbally consented to engagement with the [facility] Managed Care team.   If you are experiencing a medical emergency, please call 911 or report to your local emergency department or urgent care.   If you have a non-emergency medical problem during routine business hours, please contact your provider's office and ask to speak with a nurse.   For questions related to your [facility], please call: [phone] or visit the homepage here: [email or web address]  If you would like to schedule transportation through your [facility], please call the following number at least 2 days in advance of your appointment: [phone]   Rides for urgent appointments can also be made after hours by calling Member Services.  Call the Behavioral Health Crisis Line at [phone], at any time, 24 hours a day, 7 days a week. If you are in danger or need immediate medical attention call 911.  If you would like help to quit smoking, call 1-800-QUIT-NOW (407 154 9841) OR Espaol: 1-855-Djelo-Ya (2-542-706-2376) o para ms informacin haga clic aqu or Text READY to 283-151 to register via text  [name] - following are the goals we discussed in your visit today:   Goals Addressed   None      Social Worker will follow up on 10/28/23.   [name], BSW [place]  Value Based Care Institute Social Worker, [facility] [phone]   Following is a copy of your plan of care:  There are no care plans that you recently modified to display for this patient.

## 2023-10-06 NOTE — Patient Outreach (Signed)
 BSW returned patients voicemail.Social worker and patient discussed work accommodations. Patient email social worker a letter from employer, see below.         [name], BSW [place]  [facility] Institute Social Worker, [employer] [facility] [phone]

## 2023-10-06 NOTE — Telephone Encounter (Signed)
 Received call from Child psychotherapist. They are needing updates from letter.   Social worker is going to send a copy of the additional required information to Dr. Mikeal Alder.   Elsie Halo, RN

## 2023-10-06 NOTE — Progress Notes (Signed)
   Telephone encounter was:  Unsuccessful.  10/06/2023 Name: [name] MRN: [id number] DOB: 1985-09-07  Unsuccessful outbound call made today to assist with:  Food Insecurity  Outreach Attempt:  2nd Attempt  A HIPAA compliant voice message was left requesting a return call.  Instructed patient to call back.   Azell Leopard [facility]  [facility] Guide, Phone: [phone] Fax: [phone] Website: [email or web address]

## 2023-10-07 ENCOUNTER — Telehealth: Payer: Self-pay

## 2023-10-07 NOTE — Progress Notes (Signed)
   Telephone encounter was:  Successful.  Complex Care Management Note Care Guide Note  10/07/2023 Name: TRICHA RUGGIRELLO MRN: [id number] DOB: 1986-04-05  Obie Bells Conrow is a 38 y.o. year old female who is a primary care patient of Everhart, Kirstie, DO . The community resource team was consulted for assistance with Food Insecurity and Financial Difficulties related to financial strain  SDOH screenings and interventions completed:  No        Care guide performed the following interventions: Patient provided with information about care guide support team and interviewed to confirm resource needs. Pt stated somone had already reached out with resources and she has no needs at this time  Follow Up Plan:  No further follow up planned at this time. The patient has been provided with needed resources.  Encounter Outcome:  Patient Visit Completed    Azell Leopard [facility]  [facility] Guide, Phone: [phone] Fax: [phone] Website: [email or web address]

## 2023-10-09 NOTE — Progress Notes (Unsigned)
    SUBJECTIVE:   CHIEF COMPLAINT / HPI:   Recently started taking Synthroid  150 mcg again (2 weeks ago) after not being able to take it for 3 months.  Will need to recheck TSH in 2 to 4 weeks  Also started BP medication again 2 weeks ago but still having elevated BP at home (140-160/90s-110s). Reports episode of R sided chest pressure yesterday while cooking lasting about 10-15 minutes. Reports associated SOB. It resolved after sitting down for a few minutes.  Currently living in hotel with 4 children  Requests accomodation letter for work due to frequent urination while on BP med  PERTINENT  PMH / PSH: HTN, asthma, GERD, hypothyroidism, depression  OBJECTIVE:   BP (!) 143/108   Pulse 80   Wt 235 lb (106.6 kg)   LMP 09/26/2023   SpO2 100%   BMI 36.81 kg/m   General: well appearing, NAD Cardiovascular: RRR, no m/r/g Respiratory: normal work of breathing on RA, CTAB Extremities: No swelling BLE  ASSESSMENT/PLAN:   Assessment & Plan Hypertension, uncontrolled Uncontrolled, two elevated readings in office and elevated readings on BP cuff Will add Amlodipine 5mg , continue Hyzaar 100-25mg  If tolerates amlodipine well can consider triple therapy pill Return precautions discussed, f/u in 2 weeks Chest pain, unspecified type One episode of chest pressure yesterday. Low suspicion for ACS given history, EKG obtained today that was unremarkable and no sign of recent ischemia. Reassuring that the pain resolved. Suspect could be stress related  Hypothyroidism, unspecified type Started synthroid  2 weeks ago after previously being out for 3 months Will recheck TSH in 2 weeks at next visit   Elyce Prescott, DO [facility]

## 2023-10-10 ENCOUNTER — Ambulatory Visit (HOSPITAL_COMMUNITY)
Admission: RE | Admit: 2023-10-10 | Discharge: 2023-10-10 | Disposition: A | Discharge status: Home / Self Care | Source: Ambulatory Visit | Attending: Family Medicine | Admitting: Family Medicine

## 2023-10-10 ENCOUNTER — Ambulatory Visit (INDEPENDENT_AMBULATORY_CARE_PROVIDER_SITE_OTHER): Admitting: Family Medicine

## 2023-10-10 VITALS — BP 143/108 | HR 80 | Wt 235.0 lb

## 2023-10-10 DIAGNOSIS — R9431 Abnormal electrocardiogram [ECG] [EKG]: Secondary | ICD-10-CM | POA: Insufficient documentation

## 2023-10-10 DIAGNOSIS — R079 Chest pain, unspecified: Secondary | ICD-10-CM | POA: Diagnosis not present

## 2023-10-10 DIAGNOSIS — I10 Essential (primary) hypertension: Secondary | ICD-10-CM | POA: Diagnosis present

## 2023-10-10 DIAGNOSIS — E039 Hypothyroidism, unspecified: Secondary | ICD-10-CM | POA: Diagnosis not present

## 2023-10-10 MED ORDER — AMLODIPINE BESYLATE 5 MG PO TABS: 5.0000 mg | ORAL_TABLET | Freq: Every day | ORAL | 0 refills | Status: DC

## 2023-10-10 NOTE — Patient Instructions (Signed)
 Good to see you today - Thank you for coming in  Things we discussed today: Your goal blood pressure is less than 135/85  Check your blood pressure several times a week.  If regularly higher than this please let me know - either with MyChart or leaving a phone message. Next visit please bring in your blood pressure cuff.    I have sent in amlodipine 5 mg to take with your other blood pressure medicine.  If this works for you, we can see if insurance will cover the triple therapy pill.  We will recheck your TSH at your next appointment.  If you develop chest pain, shortness of breath, severe headache please go to the emergency room.

## 2023-10-10 NOTE — Assessment & Plan Note (Signed)
 Uncontrolled, two elevated readings in office and elevated readings on BP cuff Will add Amlodipine 5mg , continue Hyzaar 100-25mg  If tolerates amlodipine well can consider triple therapy pill Return precautions discussed, f/u in 2 weeks

## 2023-10-10 NOTE — Assessment & Plan Note (Signed)
 Started synthroid  2 weeks ago after previously being out for 3 months Will recheck TSH in 2 weeks at next visit

## 2023-10-11 NOTE — Telephone Encounter (Signed)
 Resent patient work Physicist, medical

## 2023-10-12 ENCOUNTER — Encounter: Payer: Self-pay | Admitting: Family Medicine

## 2023-10-13 ENCOUNTER — Encounter: Payer: Self-pay | Admitting: Family Medicine

## 2023-10-13 ENCOUNTER — Other Ambulatory Visit: Payer: Self-pay

## 2023-10-13 NOTE — Patient Outreach (Signed)
 Complex Care Management   Visit Note  10/13/2023  Name:  [name] MRN: [id number] DOB: 08-03-1985  Situation: Referral received for Complex Care Management related to elevated BP and thyroid  levels I obtained verbal consent from Patient.  Visit completed with Delcia Gin  on the phone  Background:   Past Medical History:  Diagnosis Date   Anemia    all 3 pregnancies   Asthma    albuterol  inhaler, last used one year ago   Bipolar disorder (HCC)    Cerumen impaction 08/20/2019   Gestational hypertension 2010   with 3rd pregnancy   Grave's disease    Hyperemesis    3rd pregnancy   No-show for appointment 08/16/2019   Postpartum thyroiditis 03/02/2011   Preterm labor    with 3rd pregnancy   Thyroid  disease    had radioactive iodine   Well woman exam with routine gynecological exam 08/10/2019    Assessment: Patient Reported Symptoms:  Cognitive Cognitive Status: Able to follow simple commands, Alert and oriented to person, place, and time, Normal speech and language skills Cognitive/Intellectual Conditions Management [RPT]: None reported or documented in medical history or problem list      Neurological Neurological Review of Symptoms: Not assessed    HEENT HEENT Symptoms Reported: Not assessed      Cardiovascular Cardiovascular Symptoms Reported: No symptoms reported Does patient have uncontrolled Hypertension?: Yes Is patient checking Blood Pressure at home?: Yes Patient's Recent BP reading at home: Patient reports she has obtained BP cuff ordered after previous CMRN visit. She has been checking daily, did not check this morning. She reports her BP has remained elevated. Note that she has not picked up Amlodipine ordered by PCP 10/10/23. She plans to pick medication up today and begin taking. Cardiovascular Conditions: Hypertension Cardiovascular Management Strategies: Medication therapy, Diet modification  Respiratory Respiratory Symptoms Reported: Not assesed     Endocrine Patient reports the following symptoms related to hypoglycemia or hyperglycemia : Not assessed    Gastrointestinal Gastrointestinal Symptoms Reported: Not assessed      Genitourinary Genitourinary Symptoms Reported: Not assessed    Integumentary Integumentary Symptoms Reported: Not assessed    Musculoskeletal Musculoskelatal Symptoms Reviewed: Not assessed        Psychosocial Psychosocial Symptoms Reported: Not assessed            10/03/2023    4:24 PM  Depression screen PHQ 2/9  Decreased Interest 0  Down, Depressed, Hopeless 0  PHQ - 2 Score 0    There were no vitals filed for this visit.  Medications Reviewed Today     Reviewed by Arno Rosaline SHAUNNA, RN (Registered Nurse) on 10/13/23 at 1403  Med List Status: <None>   Medication Order Taking? Sig Documenting Provider Last Dose Status Informant  albuterol  (VENTOLIN  HFA) 108 (90 Base) MCG/ACT inhaler [id number]  Inhale into the lungs.  Patient not taking: Reported on 10/03/2023   [provider]  Active   amLODipine (NORVASC) 5 MG tablet [id number]  Take 1 tablet (5 mg total) by mouth at bedtime. Everhart, Kirstie, DO  Active   Blood Pressure Monitor DEVI [id number]  1 Device by Does not apply route daily. Everhart, Kirstie, DO  Active   cetirizine  (ZYRTEC ) 10 MG tablet [id number]  Take 10 mg by mouth daily. [provider]  Active   levothyroxine  (SYNTHROID ) 150 MCG tablet [id number]  Take 1 tablet (150 mcg total) by mouth every morning. 30 minutes before food Rosendo Rush, MD  Active   losartan -hydrochlorothiazide  (  HYZAAR) 100-25 MG tablet [id number]  Take 1 tablet by mouth daily. Rosendo Rush, MD  Active             Recommendation:   PCP Follow-up Continue Current Plan of Care  Follow Up Plan:   Telephone follow up appointment date/time:  10/27/23 at 1 PM  [name], RN MSN [place]  [facility] RN Care Manager Direct Dial: [phone]  Fax:  [phone]

## 2023-10-13 NOTE — Patient Instructions (Signed)
 Visit Information  [name] was given information about Medicaid Managed Care team care coordination services as a part of their [facility] Community Plan Medicaid benefit. [name] [name] verbally consented to engagement with the [facility] Managed Care team.   If you are experiencing a medical emergency, please call 911 or report to your local emergency department or urgent care.   If you have a non-emergency medical problem during routine business hours, please contact your provider's office and ask to speak with a nurse.   For questions related to your [facility], please call: [phone] or visit the homepage here: [email or web address]  If you would like to schedule transportation through your [facility], please call the following number at least 2 days in advance of your appointment: [phone]   Rides for urgent appointments can also be made after hours by calling Member Services.  Call the Behavioral Health Crisis Line at [phone], at any time, 24 hours a day, 7 days a week. If you are in danger or need immediate medical attention call 911.  If you would like help to quit smoking, call 1-800-QUIT-NOW (815-014-9724) OR Espaol: 1-855-Djelo-Ya (8-144-664-6430) o para ms informacin haga clic aqu or Text READY to 799-599 to register via text  [name] - following are the goals we discussed in your visit today:   Goals Addressed             This Visit's Progress    VBCI RN Care Plan related to HTN   On track    Problems:  Chronic Disease Management support and education needs related to HTN  Goal: Over the next 30 days the Patient will attend all scheduled medical appointments: PCP as evidenced by completed visit notes uploaded to EMR        demonstrate Improved adherence to prescribed treatment plan for HTN as evidenced by patient report of improved  blood pressure readings take all medications exactly as prescribed and will call provider for medication related questions as evidenced by patient report of medication adherence, patient report of picking up new prescription (Amlodipine) and taking as ordered    work with PCP to obtain letter of accommodation for work as evidenced by review of electronic medical record and patient or care team member report    Interventions:   Hypertension Interventions: Last practice recorded BP readings:  BP Readings from Last 3 Encounters:  10/10/23 (!) 143/108  09/27/23 (!) 155/119  03/29/23 (!) 144/94   Most recent eGFR/CrCl:  Lab Results  Component Value Date   EGFR 88 11/27/2021    No components found for: CRCL  Evaluation of current treatment plan related to hypertension self management and patient's adherence to plan as established by provider Reviewed medications with patient and discussed importance of compliance Provided assistance with obtaining home blood pressure monitor via ensured patient has obtained BP cuff ordered after previous CMRN visit; Discussed plans with patient for ongoing care management follow up and provided patient with direct contact information for care management team Advised patient, providing education and rationale, to monitor blood pressure daily and record, calling PCP for findings outside established parameters Reviewed scheduled/upcoming provider appointments including:  Reviewed factors that can increase BP such as stress  Patient Self-Care Activities:  Attend all scheduled provider appointments Call provider office for new concerns or questions  Take medications as prescribed   Work with the social worker to address care coordination needs and will continue to work with the clinical  team to address health care and disease management related needs check blood pressure daily keep a blood pressure log take blood pressure log to all doctor appointments take  medications for blood pressure exactly as prescribed report new symptoms to your doctor  Plan:  Telephone follow up appointment with care management team member scheduled for:  10/27/23 at 1 PM The patient has been provided with contact information for the care management team and has been advised to call with any health related questions or concerns.              Patient verbalizes understanding of instructions and care plan provided today and agrees to view in MyChart. Active MyChart status and patient understanding of how to access instructions and care plan via MyChart confirmed with patient.     Patient                                              will continue to check BP daily and alert PCP of elevated readings. Telephone follow up appointment with Managed Medicaid care management team member scheduled for: 10/27/23 at 1 PM  Carrie Finlay, RN MSN [place]  [facility] Population Health RN Care Manager Direct Dial: [phone]  Fax: [phone]   Following is a copy of your plan of care:  There are no care plans that you recently modified to display for this patient.

## 2023-10-19 ENCOUNTER — Encounter: Payer: Self-pay | Admitting: Family Medicine

## 2023-10-20 ENCOUNTER — Other Ambulatory Visit: Payer: Self-pay | Admitting: Family Medicine

## 2023-10-24 ENCOUNTER — Ambulatory Visit (INDEPENDENT_AMBULATORY_CARE_PROVIDER_SITE_OTHER): Payer: Self-pay | Admitting: Family Medicine

## 2023-10-24 VITALS — BP 130/100 | HR 75 | Ht 67.0 in | Wt 228.0 lb

## 2023-10-24 DIAGNOSIS — E039 Hypothyroidism, unspecified: Secondary | ICD-10-CM | POA: Diagnosis not present

## 2023-10-24 DIAGNOSIS — I10 Essential (primary) hypertension: Secondary | ICD-10-CM | POA: Diagnosis present

## 2023-10-24 NOTE — Patient Instructions (Signed)
 Your goal blood pressure is less than 135/85  Check your blood pressure several times a week.  If regularly higher than this please let me know - either with MyChart or leaving a phone message. Next visit please bring in your blood pressure cuff.     Let us  know if you need anything  I'll see you in a few  weeks to recheck your BP and thyroid 

## 2023-10-24 NOTE — Assessment & Plan Note (Signed)
 Will check TSH today Will likely need to increase Synthroid  dose depending on TSH level

## 2023-10-24 NOTE — Progress Notes (Signed)
    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 Likely still elevated because she has not started the amlodipine  yet Advised to start this today Follow-up in 2 to 3 weeks after she has been taking it regularly Cautioned for side effects such as swelling, advised to let us  know if she has negative side effects Return precautions discussed Hypothyroidism, unspecified type Will check TSH today Will likely need to increase Synthroid  dose depending on TSH level     Carrie Prescott, DO [facility]

## 2023-10-24 NOTE — Assessment & Plan Note (Signed)
 BP elevated in office today asymptomatic. Likely still elevated because she has not started the amlodipine  yet Advised to start this today Follow-up in 2 to 3 weeks after she has been taking it regularly Cautioned for side effects such as swelling, advised to let us  know if she has negative side effects Return precautions discussed

## 2023-10-25 ENCOUNTER — Ambulatory Visit: Payer: Self-pay | Admitting: Family Medicine

## 2023-10-25 LAB — TSH: TSH: 5.45 u[IU]/mL — ABNORMAL HIGH (ref 0.450–4.500)

## 2023-10-25 MED ORDER — LEVOTHYROXINE SODIUM 175 MCG PO TABS: 175.0000 ug | ORAL_TABLET | Freq: Every day | ORAL | 1 refills | Status: DC

## 2023-10-27 ENCOUNTER — Other Ambulatory Visit: Payer: Self-pay

## 2023-10-27 NOTE — Patient Instructions (Signed)
 Visit Information  [name] was given information about Medicaid Managed Care team care coordination services as a part of their [facility] Community Plan Medicaid benefit. [name] [name] verbally consented to engagement with the [facility] Managed Care team.   If you are experiencing a medical emergency, please call 911 or report to your local emergency department or urgent care.   If you have a non-emergency medical problem during routine business hours, please contact your provider's office and ask to speak with a nurse.   For questions related to your [facility], please call: [phone] or visit the homepage here: [email or web address]  If you would like to schedule transportation through your [facility], please call the following number at least 2 days in advance of your appointment: [phone]   Rides for urgent appointments can also be made after hours by calling Member Services.  Call the Behavioral Health Crisis Line at [phone], at any time, 24 hours a day, 7 days a week. If you are in danger or need immediate medical attention call 911.  If you would like help to quit smoking, call 1-800-QUIT-NOW (469-233-1753) OR Espaol: 1-855-Djelo-Ya (8-144-664-6430) o para ms informacin haga clic aqu or Text READY to 799-599 to register via text  [name] - following are the goals we discussed in your visit today:   Goals Addressed             This Visit's Progress    VBCI RN Care Plan related to HTN   On track    Problems:  Chronic Disease Management support and education needs related to HTN  Goal: Over the next 30 days the Patient will attend all scheduled medical appointments: PCP as evidenced by completed visit notes uploaded to EMR        demonstrate Improved adherence to prescribed treatment plan for HTN as evidenced by patient report of improved  blood pressure readings take all medications exactly as prescribed and will call provider for medication related questions as evidenced by patient report of medication adherence     Interventions:   Hypertension Interventions: Last practice recorded BP readings:  BP Readings from Last 3 Encounters:  10/24/23 (!) 130/100  10/10/23 (!) 143/108  09/27/23 (!) 155/119   Most recent eGFR/CrCl:  Lab Results  Component Value Date   EGFR 88 11/27/2021    No components found for: CRCL  Evaluation of current treatment plan related to hypertension self management and patient's adherence to plan as established by provider Reviewed medications with patient and discussed importance of compliance Discussed plans with patient for ongoing care management follow up and provided patient with direct contact information for care management team Advised patient, providing education and rationale, to monitor blood pressure daily and record, calling PCP for findings outside established parameters Reviewed scheduled/upcoming provider appointments including:  Screening for signs and symptoms of depression related to chronic disease state  Message sent to BSW regarding patient request for programs to help obtain school supplies and uniforms for her children prior to starting school on 11/28/23. CMRN also looking into resources and will send to patient.  Patient Self-Care Activities:  Attend all scheduled provider appointments Call provider office for new concerns or questions  Take medications as prescribed   Work with the social worker to address care coordination needs and will continue to work with the clinical team to address health care and disease management related needs check blood pressure daily keep a blood pressure log take  blood pressure log to all doctor appointments take medications for blood pressure exactly as prescribed report new symptoms to your doctor  Plan:  Telephone follow up  appointment with care management team member scheduled for:  11/23/23 at 3:30 PM The patient has been provided with contact information for the care management team and has been advised to call with any health related questions or concerns.           VBCI RN Care Plan related to hypothyroidism   On track    Problems:  Chronic Disease Management support and education needs related to Hypothyroidism  Goal: Over the next 30 days the Patient will attend all scheduled medical appointments: PCP as evidenced by completed visit notes uploaded to EMR        demonstrate Improved adherence to prescribed treatment plan for Hypothyroidism as evidenced by patient report of compliance with increased dose of thyroid  medication take all medications exactly as prescribed and will call provider for medication related questions as evidenced by patient report of medication adherence     Interventions:   Evaluation of current treatment plan related to Hypothyroidism,  self-management and patient's adherence to plan as established by provider. Discussed plans with patient for ongoing care management follow up and provided patient with direct contact information for care management team Evaluation of current treatment plan related to hypothyroidism and patient's adherence to plan as established by provider Reviewed scheduled/upcoming provider appointments including PCP Message sent to BSW regarding patient request for programs to help obtain school supplies and uniforms for her children prior to starting school on 11/28/23. CMRN also looking into resources and will send to patient.  Patient Self-Care Activities:  Attend all scheduled provider appointments Call provider office for new concerns or questions  Take medications as prescribed   Work with the social worker to address care coordination needs and will continue to work with the clinical team to address health care and disease management related  needs  Plan:  Telephone follow up appointment with care management team member scheduled for:  11/23/23 at 3:30 PM The patient has been provided with contact information for the care management team and has been advised to call with any health related questions or concerns.              Patient verbalizes understanding of instructions and care plan provided today and agrees to view in MyChart. Active MyChart status and patient understanding of how to access instructions and care plan via MyChart confirmed with patient.     RN Care Manager will research programs for school supplies and uniforms. Message sent to [facility] for additional recommendations. Telephone follow up appointment with Managed Medicaid care management team member scheduled for: 11/23/23 at 3:30 PM  [name], RN MSN [place]  [facility] Health RN Care Manager Direct Dial: [phone]  Fax: [phone]   Following is a copy of your plan of care:  There are no care plans that you recently modified to display for this patient.

## 2023-10-27 NOTE — Patient Outreach (Signed)
 Complex Care Management   Visit Note  10/27/2023  Name:  [name] MRN: [id number] DOB: 1985/07/11  Situation: Referral received for Complex Care Management related to elevated BP and thyroid  levels I obtained verbal consent from Patient.  Visit completed with [name]  on the phone  Background:   Past Medical History:  Diagnosis Date   Anemia    all 3 pregnancies   Asthma    albuterol  inhaler, last used one year ago   Bipolar disorder (HCC)    Cerumen impaction 08/20/2019   Gestational hypertension 2010   with 3rd pregnancy   Grave's disease    Hyperemesis    3rd pregnancy   No-show for appointment 08/16/2019   Postpartum thyroiditis 03/02/2011   Preterm labor    with 3rd pregnancy   Thyroid  disease    had radioactive iodine   Well woman exam with routine gynecological exam 08/10/2019    Assessment: Patient Reported Symptoms:  Cognitive Cognitive Status: Able to follow simple commands, Alert and oriented to person, place, and time, Normal speech and language skills Cognitive/Intellectual Conditions Management [RPT]: None reported or documented in medical history or problem list      Neurological Neurological Review of Symptoms: Not assessed    HEENT HEENT Symptoms Reported: Not assessed      Cardiovascular Cardiovascular Symptoms Reported: No symptoms reported Does patient have uncontrolled Hypertension?: Yes Is patient checking Blood Pressure at home?: Yes Patient's Recent BP reading at home: 131/96 Cardiovascular Management Strategies: Medication therapy, Diet modification Cardiovascular Comment: Patient reports she has noticed BP started to trend down with the addition of Amlodipine .  Respiratory Respiratory Symptoms Reported: Not assesed    Endocrine Endocrine Symptoms Reported: No symptoms reported Is patient diabetic?: No Endocrine Comment: Per chart review, PCP recently increased dose of Synthroid . Patient reports she will pick up increased dose  of medication today and begin taking tomorrow.  Gastrointestinal Gastrointestinal Symptoms Reported: Not assessed      Genitourinary Genitourinary Symptoms Reported: Not assessed    Integumentary Integumentary Symptoms Reported: Not assessed    Musculoskeletal Musculoskelatal Symptoms Reviewed: Not assessed        Psychosocial Psychosocial Symptoms Reported: Not assessed            10/24/2023    4:04 PM  Depression screen PHQ 2/9  Decreased Interest 1  Down, Depressed, Hopeless 1  PHQ - 2 Score 2  Altered sleeping 1  Tired, decreased energy 1  Change in appetite 1  Feeling bad or failure about yourself  0  Trouble concentrating 1  Moving slowly or fidgety/restless 1  Suicidal thoughts 0  PHQ-9 Score 7    There were no vitals filed for this visit.  Medications Reviewed Today     Reviewed by Arno Rosaline SHAUNNA, RN (Registered Nurse) on 10/27/23 at 1308  Med List Status: <None>   Medication Order Taking? Sig Documenting Provider Last Dose Status Informant  albuterol  (VENTOLIN  HFA) 108 (90 Base) MCG/ACT inhaler [id number]  Inhale into the lungs.  Patient not taking: Reported on 10/03/2023   [provider]  Active   amLODipine  (NORVASC ) 5 MG tablet [id number] Yes Take 1 tablet (5 mg total) by mouth at bedtime. Everhart, Kirstie, DO  Active   Blood Pressure Monitor DEVI [id number] Yes 1 Device by Does not apply route daily. Everhart, Kirstie, DO  Active   cetirizine  (ZYRTEC ) 10 MG tablet [id number]  Take 10 mg by mouth daily. [provider]  Active   levothyroxine  (SYNTHROID ) 175  MCG tablet [id number] Yes Take 1 tablet (175 mcg total) by mouth daily before breakfast. Everhart, Kirstie, DO  Active   losartan -hydrochlorothiazide  (HYZAAR) 100-25 MG tablet [id number] Yes Take 1 tablet by mouth daily. Rosendo Rush, MD  Active             Recommendation:   PCP Follow-up Continue Current Plan of Care CMRN will research programs for school supplies and  uniforms  Follow Up Plan:   Telephone follow up appointment date/time:  11/23/23 at 3:30 PM  [name], RN MSN [place]  [facility] RN Care Manager Direct Dial: [phone]  Fax: [phone]

## 2023-10-28 ENCOUNTER — Other Ambulatory Visit: Payer: Self-pay

## 2023-11-11 ENCOUNTER — Ambulatory Visit: Payer: Self-pay | Admitting: Family Medicine

## 2023-11-15 ENCOUNTER — Encounter: Payer: Self-pay | Admitting: Family Medicine

## 2023-11-15 NOTE — Telephone Encounter (Signed)
Printed and placed in PCP box.

## 2023-11-16 ENCOUNTER — Telehealth: Admitting: Physician Assistant

## 2023-11-16 DIAGNOSIS — Z7689 Persons encountering health services in other specified circumstances: Secondary | ICD-10-CM

## 2023-11-16 NOTE — Patient Instructions (Signed)
  [name], thank you for joining [name], PA-C for today's virtual visit.  While this provider is not your primary care provider (PCP), if your PCP is located in our provider database this encounter information will be shared with them immediately following your visit.   A [facility] MyChart account gives you access to today's visit and all your visits, tests, and labs performed at [facility]  click here if you don't have a [facility] MyChart account or go to [email or web address]  Consent: (Patient) [name] provided verbal consent for this virtual visit at the beginning of the encounter.  Current Medications:  Current Outpatient Medications:    albuterol  (VENTOLIN  HFA) 108 (90 Base) MCG/ACT inhaler, Inhale into the lungs. (Patient not taking: Reported on 10/03/2023), Disp: , Rfl:    amLODipine  (NORVASC ) 5 MG tablet, Take 1 tablet (5 mg total) by mouth at bedtime., Disp: 30 tablet, Rfl: 0   Blood Pressure Monitor DEVI, 1 Device by Does not apply route daily., Disp: 1 each, Rfl: 0   cetirizine  (ZYRTEC ) 10 MG tablet, Take 10 mg by mouth daily., Disp: , Rfl:    levothyroxine  (SYNTHROID ) 175 MCG tablet, Take 1 tablet (175 mcg total) by mouth daily before breakfast., Disp: 30 tablet, Rfl: 1   losartan -hydrochlorothiazide  (HYZAAR) 100-25 MG tablet, Take 1 tablet by mouth daily., Disp: 30 tablet, Rfl: 0   Medications ordered in this encounter:  No orders of the defined types were placed in this encounter.    *If you need refills on other medications prior to your next appointment, please contact your pharmacy*  Follow-Up: Call back or seek an in-person evaluation if the symptoms worsen or if the condition fails to improve as anticipated.  [facility] [phone]  Other Instructions   If you have been instructed to have an in-person evaluation today at a local Urgent Care facility, please use the link below. It will take you to a  list of all of our available [facility], including address, phone number and hours of operation. Please do not delay care.  [facility]  If you or a family member do not have a primary care provider, use the link below to schedule a visit and establish care. When you choose a [facility] primary care physician or advanced practice provider, you gain a long-term partner in health. Find a Primary Care Provider  Learn more about [facility] in-office and virtual care options: [facility] - Get Care Now

## 2023-11-16 NOTE — Progress Notes (Signed)
 Virtual Visit Consent   [name], you are scheduled for a virtual visit with a [facility] provider today. Just as with appointments in the office, your consent must be obtained to participate. Your consent will be active for this visit and any virtual visit you may have with one of our providers in the next 365 days. If you have a MyChart account, a copy of this consent can be sent to you electronically.  As this is a virtual visit, video technology does not allow for your provider to perform a traditional examination. This may limit your provider's ability to fully assess your condition. If your provider identifies any concerns that need to be evaluated in person or the need to arrange testing (such as labs, EKG, etc.), we will make arrangements to do so. Although advances in technology are sophisticated, we cannot ensure that it will always work on either your end or our end. If the connection with a video visit is poor, the visit may have to be switched to a telephone visit. With either a video or telephone visit, we are not always able to ensure that we have a secure connection.  By engaging in this virtual visit, you consent to the provision of healthcare and authorize for your insurance to be billed (if applicable) for the services provided during this visit. Depending on your insurance coverage, you may receive a charge related to this service.  I need to obtain your verbal consent now. Are you willing to proceed with your visit today? HEIDIE KRALL has provided verbal consent on 11/16/2023 for a virtual visit (video or telephone). [name], [place]  Date: 11/16/2023 12:22 PM   Virtual Visit via Video Note   I, [name], connected with  GLORISTINE TURRUBIATES  ([id number], [birth year]) on 11/16/23 at 12:00 PM EDT by a video-enabled telemedicine application and verified that I am speaking with the correct person using two identifiers.  Location: Patient: Virtual Visit  Location Patient: Home Provider: Virtual Visit Location Provider: Home Office   I discussed the limitations of evaluation and management by telemedicine and the availability of in person appointments. The patient expressed understanding and agreed to proceed.    History of Present Illness: [name] is a 38 y.o. who identifies as a female who was assigned female at birth, and is being seen today for need of completion of return to work forms. Patient has been out of work for some time due to uncontrolled hypertension and hypothyroidism. Has been managed by her PCP with recent adjustment in BP medication and levothyroxine  due to recent lab results. Next follow-up is 8/14. Notes she has been trying to get in touch with her PCP office for completion of forms as she needs to return to work ASAP giving current homelessness and having to hotel surf with her children.  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 (Patient not taking: Reported on 10/03/2023), Disp: , Rfl:    amLODipine  (NORVASC ) 5 MG tablet, Take 1 tablet (5 mg total) by mouth at bedtime., Disp: 30 tablet, Rfl: 0   Blood Pressure Monitor DEVI, 1 Device by Does not apply route daily., Disp: 1 each, Rfl: 0   cetirizine  (ZYRTEC ) 10 MG tablet, Take 10 mg by mouth  daily., Disp: , Rfl:    levothyroxine  (SYNTHROID ) 175 MCG tablet, Take 1 tablet (175 mcg total) by mouth daily before breakfast., Disp: 30 tablet, Rfl: 1   losartan -hydrochlorothiazide  (HYZAAR) 100-25 MG tablet, Take 1 tablet by mouth daily., Disp: 30 tablet, Rfl: 0  Observations/Objective: Patient is well-developed, well-nourished in no acute distress.  Resting comfortably at home.  Head is normocephalic, atraumatic.  No labored breathing. Speech is clear and coherent with logical content.  Patient is alert and oriented at baseline.   Assessment and Plan: 1. Return to work evaluation (Primary)  Discussed with patient that we are not able to complete return to work forms as we are not managing her care nor placed her on leave with work. It does look like on chart  review that PCP has received message and had her CMA print forms so that they could be completed.  Will send cc note to PCP regarding patient need for expedite form completion if possible due to homelessness. May benefit from social work consult as well.   Follow Up Instructions: I discussed the assessment and treatment plan with the patient. The patient was provided an opportunity to ask questions and all were answered. The patient agreed with the plan and demonstrated an understanding of the instructions.  A copy of instructions were sent to the patient via MyChart unless otherwise noted below.   The patient was advised to call back or seek an in-person evaluation if the symptoms worsen or if the condition fails to improve as anticipated.    Carrie Velma Lunger, PA-C

## 2023-11-17 NOTE — Telephone Encounter (Signed)
 Spoke with patient.   She is requesting we leave a copy up front for pick up.   Copy made for batch scanning as well.

## 2023-11-23 ENCOUNTER — Telehealth: Payer: Self-pay

## 2023-11-23 NOTE — Patient Outreach (Signed)
 Care Coordination   11/23/2023 Name: ELISEA KHADER MRN: [id number] DOB: October 27, 1985   Care Coordination Outreach Attempts:  An unsuccessful outreach was attempted for an appointment today.  Follow Up Plan:  Additional outreach attempts will be made to complete CCM follow-up visit.   Encounter Outcome:  No Answer. HIPAA compliant voicemail left asking for return call.   [name], RN MSN [place]  [facility] RN Care Manager Direct Dial: [phone]  Fax: [phone]

## 2023-11-28 NOTE — Patient Outreach (Signed)
 Care Coordination   11/28/2023 Name: [name] MRN: [id number] DOB: Feb 10, 1986   Care Coordination Outreach Attempts:  A second unsuccessful outreach was attempted today to complete CCM follow-up visit.  Follow Up Plan:  Additional outreach attempts will be made to complete follow-up visit.   Encounter Outcome:  Patient Request to Call Back tomorrow 11/29/23.   [name], RN MSN [place]  [facility] Population Health RN Care Manager Direct Dial: [phone]  Fax: [phone]

## 2023-11-29 NOTE — Patient Outreach (Signed)
 Care Coordination   11/29/2023 Name: [name] MRN: [id number] DOB: [birth year]   Care Coordination Outreach Attempts:  A third unsuccessful outreach was attempted today to complete CCM follow-up visit.  Follow Up Plan:  No further outreach attempts will be made at this time. We have been unable to contact the patient to complete follow-up visit.  Encounter Outcome:  No Answer. HIPAA compliant voicemail left asking for return call.   [name], RN MSN [place]  [facility] Population Health RN Care Manager Direct Dial: [phone]  Fax: [phone]

## 2023-12-05 ENCOUNTER — Ambulatory Visit: Admitting: Family Medicine

## 2023-12-05 NOTE — Progress Notes (Deleted)
    SUBJECTIVE:   CHIEF COMPLAINT / HPI:   BP follow up Started amlodipine  5mg  at last visit 10/24/2023  Hypothyroidism Checking TSH today Restarted prior synthroid  dose 09/27/23  PERTINENT  PMH / PSH: ***  OBJECTIVE:   There were no vitals taken for this visit.  ***  ASSESSMENT/PLAN:   Assessment & Plan      Elyce Prescott, DO [place] [facility]

## 2023-12-17 ENCOUNTER — Other Ambulatory Visit: Payer: Self-pay | Admitting: Family Medicine

## 2023-12-17 ENCOUNTER — Other Ambulatory Visit: Payer: Self-pay | Admitting: Student

## 2023-12-17 DIAGNOSIS — I10 Essential (primary) hypertension: Secondary | ICD-10-CM

## 2023-12-20 ENCOUNTER — Ambulatory Visit: Admitting: Family Medicine

## 2023-12-20 NOTE — Progress Notes (Deleted)
    SUBJECTIVE:   CHIEF COMPLAINT / HPI:   Hypothyroidism f/u Last TSH 10/25/23 was 5.45 Increased synthroid  to  HTN f/u Started amlodipine  5mg  at last visit   PERTINENT  PMH / PSH: HTN, asthma, hypothyroidism, depression  OBJECTIVE:   There were no vitals taken for this visit.  ***  ASSESSMENT/PLAN:   Assessment & Plan      Elyce Prescott, DO [facility]

## 2024-04-14 ENCOUNTER — Ambulatory Visit (HOSPITAL_COMMUNITY): Admission: EM | Admit: 2024-04-14 | Discharge: 2024-04-14 | Disposition: A | Discharge status: Home / Self Care

## 2024-04-14 ENCOUNTER — Encounter (HOSPITAL_COMMUNITY): Payer: Self-pay

## 2024-04-14 ENCOUNTER — Ambulatory Visit (HOSPITAL_COMMUNITY)
Admission: EM | Admit: 2024-04-14 | Discharge: 2024-04-14 | Disposition: A | Discharge status: Home / Self Care | Attending: Emergency Medicine | Admitting: Emergency Medicine

## 2024-04-14 DIAGNOSIS — I10 Essential (primary) hypertension: Secondary | ICD-10-CM | POA: Diagnosis not present

## 2024-04-14 DIAGNOSIS — L03119 Cellulitis of unspecified part of limb: Secondary | ICD-10-CM | POA: Diagnosis not present

## 2024-04-14 DIAGNOSIS — R200 Anesthesia of skin: Secondary | ICD-10-CM

## 2024-04-14 DIAGNOSIS — L02519 Cutaneous abscess of unspecified hand: Secondary | ICD-10-CM

## 2024-04-14 MED ORDER — LOSARTAN POTASSIUM-HCTZ 100-25 MG PO TABS: 1.0000 | ORAL_TABLET | Freq: Every day | ORAL | 0 refills | Status: AC

## 2024-04-14 MED ORDER — AMLODIPINE BESYLATE 5 MG PO TABS: 5.0000 mg | ORAL_TABLET | Freq: Every day | ORAL | 0 refills | Status: AC

## 2024-04-14 MED ORDER — AMOXICILLIN-POT CLAVULANATE 875-125 MG PO TABS: 1.0000 | ORAL_TABLET | Freq: Two times a day (BID) | ORAL | 0 refills | Status: AC

## 2024-04-14 NOTE — ED Triage Notes (Signed)
 Patient presents to the office for left hand pain x 3 days. Patient states numbness and tingling.

## 2024-04-14 NOTE — ED Notes (Signed)
 I called patient to moved back to a room and no one answered

## 2024-04-14 NOTE — ED Notes (Signed)
 Called patient x 2 no answer

## 2024-04-14 NOTE — Discharge Instructions (Signed)
 Start taking Augmentin  twice daily for 7 days for infection coverage. Soak your thumb in warm water and apply warm compresses to help reduce swelling and promote drainage. Your tetanus is up-to-date so you do not need this today.  As discussed I believe the numbness in your fingers is related to your chronic carpal tunnel.  Keep your appointment with Dr. Ortman for further evaluation of this.  Until this appointment rest, ice, and elevate to help with any pain or swelling related to this.  I have refilled your blood pressure medication today.  Start taking this daily as prescribed.  Follow-up with your primary care provider for any additional refills of this.

## 2024-04-14 NOTE — ED Provider Notes (Signed)
 " [facility]    CSN: [id number] Arrival date & time: 04/14/24  1535      History   Chief Complaint Chief Complaint  Patient presents with   Hand Pain    HPI [name] is a 38 y.o. female.   Patient presents with swelling and pain to right thumb that began shortly after an altercation with 2 dogs a month ago.  Patient states that her dog's tooth grazed her right thumb and she had a small wound to this area.  Patient states that since then she has had increased pain and swelling to the area.  Patient denies any drainage from the area.  Patient denies any fever, body aches, or chills.  Patient also reports numbness and tingling to her fingers of her right hand that began approximately 3 days ago.  Patient states that she does have a history of carpal tunnel syndrome and wonders if this may be related.  Patient does see Dr. Ortman for management of this and has an appointment scheduled on 1/10 for further evaluation of this.  Patient's blood pressure is also elevated in clinic today.  Patient states that she does have a history of high blood pressure and she ran out of her blood pressure medication approximately 1 month ago.  Patient states that she recently started a new job and became very busy and therefore forgot to take her blood pressure medication regularly.  The history is provided by the patient and medical records.  Hand Pain    Past Medical History:  Diagnosis Date   Anemia    all 3 pregnancies   Asthma    albuterol  inhaler, last used one year ago   Bipolar disorder (HCC)    Cerumen impaction 08/20/2019   Gestational hypertension 2010   with 3rd pregnancy   Grave's disease    Hyperemesis    3rd pregnancy   No-show for appointment 08/16/2019   Postpartum thyroiditis 03/02/2011   Preterm labor    with 3rd pregnancy   Thyroid  disease    had radioactive iodine   Well woman exam with routine gynecological exam 08/10/2019    Patient Active Problem  List   Diagnosis Date Noted   Hypertension, uncontrolled 10/04/2023   Encounter for screening involving social determinants of health (SDoH) 09/27/2023   Left-sided headache 04/22/2020   Thumb pain 01/28/2020   Acid reflux 01/28/2020   Depression 08/18/2014   ADHD, adult residual type 08/18/2014   Seasonal allergies 07/08/2014   Hypothyroidism 07/03/2014   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 Authorizing Provider  amoxicillin -clavulanate (AUGMENTIN ) 875-125 MG  tablet Take 1 tablet by mouth every 12 (twelve) hours. 04/14/24  Yes Johnie, Danaka Llera A, NP  amLODipine  (NORVASC ) 5 MG tablet Take 1 tablet (5 mg total) by mouth daily. 04/14/24   Johnie Flaming A, NP  Blood Pressure Monitor DEVI 1 Device by Does not apply route daily. 10/04/23   Everhart, Kirstie, DO  cetirizine  (ZYRTEC ) 10 MG tablet Take 10 mg by mouth daily.    [provider]  levothyroxine  (SYNTHROID ) 175 MCG tablet Take 1 tablet (175 mcg total) by mouth daily before breakfast. 10/25/23   Everhart, Kirstie, DO  losartan -hydrochlorothiazide  (HYZAAR) 100-25 MG tablet Take 1 tablet by mouth daily. 04/14/24   Johnie Flaming LABOR, NP    Family History Family History  Problem Relation Age of Onset   Hypertension Mother    Anemia Mother    Bipolar disorder Mother    Hypertension Father    Diabetes  Maternal Grandmother    Diabetes Paternal Grandmother    Cancer Paternal Grandmother    Thyroid  disease Maternal Aunt    Bipolar disorder Maternal Aunt    Diabetes Maternal Uncle    Anesthesia problems Neg Hx    Hypotension Neg Hx    Malignant hyperthermia Neg Hx    Pseudochol deficiency Neg Hx     Social History Social History[1]   Allergies   Shellfish allergy   Review of Systems Review of Systems  Per HPI  Physical Exam Triage Vital Signs ED Triage Vitals  Encounter Vitals Group     BP 04/14/24 1916 (!) 193/139     Girls Systolic BP Percentile --      Girls Diastolic BP Percentile --      Boys Systolic BP Percentile --      Boys Diastolic BP Percentile --      Pulse Rate 04/14/24 1917 73     Resp 04/14/24 1916 20     Temp 04/14/24 1917 98.1 F (36.7 C)     Temp Source 04/14/24 1917 Oral     SpO2 04/14/24 1916 96 %     Weight --      Height --      Head Circumference --      Peak Flow --      Pain Score 04/14/24 1918 5     Pain Loc --      Pain Education --      Exclude from Growth Chart --    No data found.  Updated Vital Signs BP (!) 193/139 (BP Location: Left Arm)   Pulse 73   Temp 98.1 F (36.7 C) (Oral)   Resp 20   LMP 04/02/2024 (Approximate)   SpO2 96%   Visual Acuity Right Eye Distance:   Left Eye Distance:   Bilateral Distance:    Right Eye Near:   Left Eye Near:    Bilateral Near:     Physical Exam Vitals and nursing note reviewed.  Constitutional:      General: She is awake. She is not in acute distress.    Appearance: Normal appearance. She is well-developed and well-groomed. She is not ill-appearing.  Cardiovascular:     Rate and Rhythm: Normal rate and regular rhythm.  Pulmonary:     Effort: Pulmonary effort is normal.     Breath sounds: Normal breath sounds.  Musculoskeletal:     Right hand: Swelling present. No deformity, tenderness or bony tenderness. Normal range of motion. Normal strength. Normal sensation. There is  no disruption of two-point discrimination. Normal capillary  refill. Normal pulse.       Hands:     Comments: Area of induration and erythema noted over DIP joint of right thumb concerning for infection.  No other significant findings noted to right hand.  Skin:    General: Skin is warm and dry.  Neurological:     General: No focal deficit present.     Mental Status: She is alert and oriented to person, place, and time. Mental status is at baseline.  Psychiatric:        Behavior: Behavior is cooperative.      UC Treatments / Results  Labs (all labs ordered are listed, but only abnormal results are displayed) Labs Reviewed - No data to display  EKG   Radiology No results found.  Procedures Procedures (including critical care time)  Medications Ordered in UC Medications - No data to display  Initial Impression / Assessment and Plan / UC Course  I have reviewed the triage vital signs and the nursing notes.  Pertinent labs & imaging results that were available during my care of the patient were reviewed by me and considered in my medical decision making (see chart for details).     Patient is overall well-appearing.  Blood pressure is elevated at 193/139, otherwise all other vital signs are stable.  Exam findings concerning for infection related to dog bite.  Prescribed Augmentin  for this.  Recommended warm soaks and warm compresses to help promote drainage from this area.  Deferred Tdap as last 1 was given in 2023.  Recommended patient keep appointment with Dr. Ahmad to evaluate numbness in fingers related to possible carpal tunnel syndrome.  Patient is agreeable to this at this time.  Refilled amlodipine  and losartan  HCTZ for blood pressure.  Discussed importance of following up with PCP for further evaluation and management of blood pressure.  Discussed follow-up and return precautions. Final Clinical Impressions(s) / UC Diagnoses   Final diagnoses:  Cellulitis and  abscess of hand  Numbness of fingers  Essential hypertension     Discharge Instructions      Start taking Augmentin  twice daily for 7 days for infection coverage. Soak your thumb in warm water and apply warm compresses to help reduce swelling and promote drainage. Your tetanus is up-to-date so you do not need this today.  As discussed I believe the numbness in your fingers is related to your chronic carpal tunnel.  Keep your appointment with Dr. Ortman for further evaluation of this.  Until this appointment rest, ice, and elevate to help with any pain or swelling related to this.  I have refilled your blood pressure medication today.  Start taking this daily as prescribed.  Follow-up with your primary care provider for any additional refills of this.   ED Prescriptions     Medication Sig Dispense Auth. Provider   amLODipine  (NORVASC ) 5 MG tablet Take 1 tablet (5 mg total) by mouth daily. 30 tablet Johnie Flaming A, NP   losartan -hydrochlorothiazide  (HYZAAR) 100-25 MG tablet Take 1 tablet by mouth daily. 30 tablet Johnie Flaming A, NP   amoxicillin -clavulanate (AUGMENTIN ) 875-125 MG tablet Take 1 tablet by mouth every 12 (twelve) hours. 14 tablet Johnie Flaming A, NP      PDMP not reviewed this encounter.    [1]  Social History Tobacco Use   Smoking status: Former    Current packs/day: 0.00    Average packs/day: 0.3 packs/day for 0.5 years (0.1 ttl pk-yrs)    Types: Cigarettes    Start  date: 05/20/2011    Quit date: 11/18/2011    Years since quitting: 12.4    Passive exposure: Past   Smokeless tobacco: Never  Vaping Use   Vaping status: Never Used  Substance Use Topics   Alcohol use: No   Drug use: Not Currently    Types: Marijuana     Johnie Rumaldo LABOR, NP 04/14/24 1953  "

## 2024-04-14 NOTE — ED Notes (Signed)
 Provided a work note and explanation

## 2024-05-11 ENCOUNTER — Other Ambulatory Visit: Payer: Self-pay | Admitting: Family Medicine
